# Patient Record
Sex: Female | Born: 1970
Health system: Southern US, Community
[De-identification: ages and names within clinical notes are randomized; demographics above are authoritative.]

## PROBLEM LIST (undated history)

## (undated) DIAGNOSIS — N3010 Interstitial cystitis (chronic) without hematuria: Secondary | ICD-10-CM

## (undated) DIAGNOSIS — F32A Depression, unspecified: Secondary | ICD-10-CM

## (undated) DIAGNOSIS — I10 Essential (primary) hypertension: Secondary | ICD-10-CM

## (undated) DIAGNOSIS — F419 Anxiety disorder, unspecified: Secondary | ICD-10-CM

## (undated) DIAGNOSIS — F329 Major depressive disorder, single episode, unspecified: Secondary | ICD-10-CM

## (undated) DIAGNOSIS — F319 Bipolar disorder, unspecified: Secondary | ICD-10-CM

## (undated) HISTORY — DX: Major depressive disorder, single episode, unspecified: F32.9

## (undated) HISTORY — DX: Interstitial cystitis (chronic) without hematuria: N30.10

## (undated) HISTORY — PX: ABDOMINAL HYSTERECTOMY: SHX81

## (undated) HISTORY — PX: TOTAL ABDOMINAL HYSTERECTOMY W/ BILATERAL SALPINGOOPHORECTOMY: SHX83

## (undated) HISTORY — DX: Anxiety disorder, unspecified: F41.9

## (undated) HISTORY — DX: Bipolar disorder, unspecified: F31.9

## (undated) HISTORY — DX: Depression, unspecified: F32.A

## (undated) HISTORY — PX: CYSTOSCOPY: SUR368

---

## 2004-06-20 ENCOUNTER — Emergency Department (HOSPITAL_COMMUNITY): Admission: EM | Admit: 2004-06-20 | Discharge: 2004-06-20 | Payer: Self-pay | Admitting: Emergency Medicine

## 2004-08-31 LAB — CONVERTED CEMR LAB: Pap Smear: NORMAL

## 2004-12-30 ENCOUNTER — Emergency Department (HOSPITAL_COMMUNITY): Admission: EM | Admit: 2004-12-30 | Discharge: 2004-12-30 | Payer: Self-pay | Admitting: Emergency Medicine

## 2006-10-25 ENCOUNTER — Other Ambulatory Visit: Admission: RE | Admit: 2006-10-25 | Discharge: 2006-10-25 | Payer: Self-pay | Admitting: Family Medicine

## 2007-09-14 ENCOUNTER — Ambulatory Visit: Payer: Self-pay | Admitting: Internal Medicine

## 2007-09-14 DIAGNOSIS — N3010 Interstitial cystitis (chronic) without hematuria: Secondary | ICD-10-CM

## 2007-09-14 DIAGNOSIS — F17200 Nicotine dependence, unspecified, uncomplicated: Secondary | ICD-10-CM

## 2007-09-14 DIAGNOSIS — F1010 Alcohol abuse, uncomplicated: Secondary | ICD-10-CM | POA: Insufficient documentation

## 2007-09-14 DIAGNOSIS — F319 Bipolar disorder, unspecified: Secondary | ICD-10-CM | POA: Insufficient documentation

## 2007-09-14 DIAGNOSIS — L719 Rosacea, unspecified: Secondary | ICD-10-CM

## 2007-09-14 DIAGNOSIS — F411 Generalized anxiety disorder: Secondary | ICD-10-CM

## 2007-09-16 ENCOUNTER — Telehealth (INDEPENDENT_AMBULATORY_CARE_PROVIDER_SITE_OTHER): Payer: Self-pay | Admitting: *Deleted

## 2007-09-19 ENCOUNTER — Encounter: Admission: RE | Admit: 2007-09-19 | Discharge: 2007-09-19 | Payer: Self-pay | Admitting: Internal Medicine

## 2007-09-21 ENCOUNTER — Encounter: Payer: Self-pay | Admitting: Internal Medicine

## 2007-09-21 ENCOUNTER — Telehealth: Payer: Self-pay | Admitting: Internal Medicine

## 2007-10-03 ENCOUNTER — Telehealth: Payer: Self-pay | Admitting: Internal Medicine

## 2007-10-11 ENCOUNTER — Ambulatory Visit: Payer: Self-pay | Admitting: Internal Medicine

## 2007-11-09 ENCOUNTER — Ambulatory Visit: Payer: Self-pay | Admitting: Internal Medicine

## 2007-11-15 ENCOUNTER — Telehealth: Payer: Self-pay | Admitting: Internal Medicine

## 2007-11-25 ENCOUNTER — Telehealth: Payer: Self-pay | Admitting: Internal Medicine

## 2007-12-07 ENCOUNTER — Telehealth: Payer: Self-pay | Admitting: Internal Medicine

## 2008-01-11 ENCOUNTER — Encounter: Payer: Self-pay | Admitting: Internal Medicine

## 2008-01-12 ENCOUNTER — Ambulatory Visit (HOSPITAL_BASED_OUTPATIENT_CLINIC_OR_DEPARTMENT_OTHER): Admission: RE | Admit: 2008-01-12 | Discharge: 2008-01-12 | Payer: Self-pay | Admitting: Urology

## 2008-01-12 ENCOUNTER — Encounter (INDEPENDENT_AMBULATORY_CARE_PROVIDER_SITE_OTHER): Payer: Self-pay | Admitting: Urology

## 2008-02-07 ENCOUNTER — Telehealth: Payer: Self-pay | Admitting: Internal Medicine

## 2008-03-08 ENCOUNTER — Encounter: Payer: Self-pay | Admitting: Internal Medicine

## 2008-03-20 ENCOUNTER — Emergency Department (HOSPITAL_COMMUNITY): Admission: EM | Admit: 2008-03-20 | Discharge: 2008-03-20 | Payer: Self-pay | Admitting: Emergency Medicine

## 2008-04-22 ENCOUNTER — Encounter: Admission: RE | Admit: 2008-04-22 | Discharge: 2008-04-22 | Payer: Self-pay | Admitting: Orthopedic Surgery

## 2008-05-02 ENCOUNTER — Telehealth: Payer: Self-pay | Admitting: Internal Medicine

## 2008-07-16 ENCOUNTER — Ambulatory Visit: Payer: Self-pay | Admitting: Internal Medicine

## 2008-07-23 ENCOUNTER — Ambulatory Visit: Payer: Self-pay | Admitting: Family Medicine

## 2008-08-13 ENCOUNTER — Telehealth: Payer: Self-pay | Admitting: Internal Medicine

## 2008-08-16 ENCOUNTER — Ambulatory Visit: Payer: Self-pay | Admitting: Family Medicine

## 2008-09-11 ENCOUNTER — Ambulatory Visit: Payer: Self-pay | Admitting: Internal Medicine

## 2008-09-11 DIAGNOSIS — G43019 Migraine without aura, intractable, without status migrainosus: Secondary | ICD-10-CM

## 2008-10-12 ENCOUNTER — Telehealth: Payer: Self-pay | Admitting: Internal Medicine

## 2008-10-16 ENCOUNTER — Ambulatory Visit: Payer: Self-pay | Admitting: Internal Medicine

## 2008-10-19 ENCOUNTER — Encounter: Admission: RE | Admit: 2008-10-19 | Discharge: 2008-10-19 | Payer: Self-pay | Admitting: Internal Medicine

## 2008-10-29 ENCOUNTER — Encounter: Payer: Self-pay | Admitting: Internal Medicine

## 2008-12-03 ENCOUNTER — Encounter: Payer: Self-pay | Admitting: Internal Medicine

## 2009-02-21 ENCOUNTER — Telehealth: Payer: Self-pay | Admitting: Internal Medicine

## 2009-02-22 ENCOUNTER — Ambulatory Visit: Payer: Self-pay | Admitting: Internal Medicine

## 2009-02-22 DIAGNOSIS — R822 Biliuria: Secondary | ICD-10-CM | POA: Insufficient documentation

## 2009-02-22 LAB — CONVERTED CEMR LAB
Glucose, Urine, Semiquant: NEGATIVE
Specific Gravity, Urine: 1.01
pH: 5.5

## 2009-02-23 ENCOUNTER — Encounter: Payer: Self-pay | Admitting: Internal Medicine

## 2009-02-25 LAB — CONVERTED CEMR LAB
Albumin: 3.9 g/dL (ref 3.5–5.2)
BUN: 11 mg/dL (ref 6–23)
Bilirubin, Direct: 0.1 mg/dL (ref 0.0–0.3)
Glucose, Bld: 81 mg/dL (ref 70–99)
Total Protein: 6.8 g/dL (ref 6.0–8.3)

## 2009-06-27 ENCOUNTER — Encounter (INDEPENDENT_AMBULATORY_CARE_PROVIDER_SITE_OTHER): Payer: Self-pay | Admitting: *Deleted

## 2009-06-28 ENCOUNTER — Telehealth: Payer: Self-pay | Admitting: Internal Medicine

## 2009-07-04 ENCOUNTER — Ambulatory Visit: Payer: Self-pay | Admitting: Internal Medicine

## 2009-10-01 ENCOUNTER — Emergency Department (HOSPITAL_COMMUNITY): Admission: EM | Admit: 2009-10-01 | Discharge: 2009-10-01 | Payer: Self-pay | Admitting: Emergency Medicine

## 2010-02-13 ENCOUNTER — Ambulatory Visit: Payer: Self-pay | Admitting: Family Medicine

## 2010-02-13 DIAGNOSIS — R1013 Epigastric pain: Secondary | ICD-10-CM

## 2010-09-30 NOTE — Assessment & Plan Note (Signed)
Summary: severe epigastric pain/ cannot wait until tomorrow/dm   Vital Signs:  Patient profile:   40 year old female Weight:      229 pounds Temp:     98.0 degrees F oral BP sitting:   110 / 80  (left arm)  Vitals Entered By: Kathrynn Speed CMA (February 13, 2010 2:49 PM) CC: Epigastric pain   History of Present Illness: Patient seen with abdominal pain for the past couple of days. Questionable history of IBS though not formally diagnosed. Pain mostly mid epigastric region without radiation. Feels bloated. Does also have some more nonspecific cramp-like pains lower abdomen.  Has had some mild nausea but no vomiting. No fever. No GERD symptoms. Took ibuprofen without relief. Has alternating constipation and diarrhea.   Last BM ealier today, but small volume.  No melena or hematochezia. Not aggravated by any particular foods.  No alleviating factors.  Also has history of interstitial cystitis. Smoker. No alcohol use.  Current Medications (verified): 1)  Vitamin-B Complex   Tabs (B Complex Vitamins) .... Once Daily 2)  Caltrate 600+d 600-400 Mg-Unit  Tabs (Calcium Carbonate-Vitamin D) .... Two Times A Day 3)  Lamictal 150 Mg Tabs (Lamotrigine) .... Take 1 Tablet By Mouth Once A Day 4)  Ventolin Hfa 108 (90 Base) Mcg/act Aers (Albuterol Sulfate) .... 2 Puffs Every 4 Hours As Needed Wheezing 5)  Etodolac 500 Mg Tabs (Etodolac) .... Take 1 Tablet By Mouth Two Times A Day As Needed 6)  Verapamil Hcl Cr 240 Mg Cr-Tabs (Verapamil Hcl) .... Take 1 Tablet By Mouth Two Times A Day 7)  Zomig 5 Mg Tabs (Zolmitriptan) .... As Needed  Allergies (verified): 1)  ! * Latex 2)  ! Augmentin (Amoxicillin-Pot Clavulanate) 3)  ! Depakote (Divalproex Sodium)  Past History:  Past Medical History: Last updated: 09/02/08 interstitial cystitis Anxiety Depression  bipolar migraine headaches  Past Surgical History: Last updated: 09/14/2007 Hysterectomy  2003 (endometriosis, cysts that rupture) cystoscopy  for interstitial cystitis Oophorectomy  Family History: Last updated: September 02, 2008 mother deceased suicide Family History of Suicide attempt father unknown health Family Hsitory Headaches  Social History: Last updated: 10/16/2008 Divorced 2 kids healthy Occupation: Current Smoker Alcohol use-yes--has eliminated for the past several weeks  Risk Factors: Alcohol Use: 4+ (09/14/2007) Exercise: no (09/14/2007)  Risk Factors: Smoking Status: current (07/04/2009) Packs/Day: <0.25 (07/04/2009) PMH-FH-SH reviewed for relevance  Review of Systems       The patient complains of abdominal pain.  The patient denies anorexia, fever, weight loss, chest pain, melena, hematochezia, and severe indigestion/heartburn.    Physical Exam  General:  Well-developed,well-nourished,in no acute distress; alert,appropriate and cooperative throughout examination Mouth:  Oral mucosa and oropharynx without lesions or exudates.  Teeth in good repair. Neck:  No deformities, masses, or tenderness noted. Lungs:  Normal respiratory effort, chest expands symmetrically. Lungs are clear to auscultation, no crackles or wheezes. Heart:  Normal rate and regular rhythm. S1 and S2 normal without gallop, murmur, click, rub or other extra sounds. Abdomen:  soft and normal bowel sounds.  mild tenderness mid epigastric region. No guarding or rebound. No hepatomegaly or splenomegaly. No masses palpated. Extremities:  No clubbing, cyanosis, edema, or deformity noted with normal full range of motion of all joints.   Psych:  normally interactive, good eye contact, not anxious appearing, and not depressed appearing.     Impression & Recommendations:  Problem # 1:  ABDOMINAL PAIN, EPIGASTRIC (ICD-789.06) Assessment [place] gallstones.  DDX gastritis, IBS .  Avoid further  NSAIDS.  Samples of Dexilant 60 mg by mouth once daily given.  Short term cautious use of Bentyl (she is cautioned about possible constipation and to stop  immediatley if she has any vomiting or worsening pain on this).  Consider ultrasound and further eval if pain persists.  Discussed role of fiber and exercise in IBS.  Complete Medication List: 1)  Vitamin-b Complex Tabs (B complex vitamins) .... Once daily 2)  Caltrate 600+d 600-400 Mg-unit Tabs (Calcium carbonate-vitamin d) .... Two times a day 3)  Lamictal 150 Mg Tabs (Lamotrigine) .... Take 1 tablet by mouth once a day 4)  Ventolin Hfa 108 (90 Base) Mcg/act Aers (Albuterol sulfate) .... 2 puffs every 4 hours as needed wheezing 5)  Etodolac 500 Mg Tabs (Etodolac) .... Take 1 tablet by mouth two times a day as needed 6)  Verapamil Hcl Cr 240 Mg Cr-tabs (Verapamil hcl) .... Take 1 tablet by mouth two times a day 7)  Zomig 5 Mg Tabs (Zolmitriptan) .... As needed 8)  Bentyl 10 Mg Caps (Dicyclomine hcl) .... One by mouth q 6 hours as needed abdominal pain  Patient Instructions: 1)  avoid further use of ibuprofen, aspirin, or Aleve. 2)  Avoid fatty foods or spicy foods. 3)  Follow up immediately if you have any worsening pain, vomiting, or fever 4)  Take Dexilant 60 mg one by mouth daily. Prescriptions: BENTYL 10 MG CAPS (DICYCLOMINE HCL) one by mouth q 6 hours as needed abdominal pain  #30 x 0   Entered and Authorized by:   Evelena Peat MD   Signed by:   Evelena Peat MD on 02/13/2010   Method used:   Electronically to        [facility] # [phone]* (retail)       [address]       [place], [place]  [address]       Ph: [phone]       Fax: [phone]   RxID:   [id number]

## 2010-11-19 ENCOUNTER — Telehealth: Payer: Self-pay | Admitting: *Deleted

## 2010-11-19 LAB — URINALYSIS, ROUTINE W REFLEX MICROSCOPIC
Hgb urine dipstick: NEGATIVE
Ketones, ur: NEGATIVE mg/dL
Specific Gravity, Urine: <1.005 — ABNORMAL LOW (ref 1.005–1.030)
Urobilinogen, UA: 0.2 mg/dL (ref 0.0–1.0)

## 2010-11-19 LAB — CBC
HCT: 43.3 % (ref 36.0–46.0)
Hemoglobin: 14.9 g/dL (ref 12.0–15.0)
MCHC: 34.4 g/dL (ref 30.0–36.0)
MCV: 93.1 fL (ref 78.0–100.0)

## 2010-11-19 LAB — BASIC METABOLIC PANEL
Calcium: 9.5 mg/dL (ref 8.4–10.5)
Creatinine, Ser: 0.6 mg/dL (ref 0.4–1.2)
Potassium: 3.9 mEq/L (ref 3.5–5.1)

## 2010-11-19 LAB — DIFFERENTIAL
Eosinophils Absolute: 0.1 10*3/uL (ref 0.0–0.7)
Lymphocytes Relative: 22 % (ref 12–46)
Lymphs Abs: 1.6 10*3/uL (ref 0.7–4.0)
Monocytes Relative: 5 % (ref 3–12)

## 2010-11-19 LAB — POCT PREGNANCY, URINE: Preg Test, Ur: NEGATIVE

## 2010-11-20 ENCOUNTER — Telehealth: Payer: Self-pay | Admitting: *Deleted

## 2010-11-20 ENCOUNTER — Ambulatory Visit (INDEPENDENT_AMBULATORY_CARE_PROVIDER_SITE_OTHER)
Admission: RE | Admit: 2010-11-20 | Discharge: 2010-11-20 | Disposition: A | Discharge status: Home / Self Care | Payer: Self-pay | Source: Ambulatory Visit | Attending: Internal Medicine | Admitting: Internal Medicine

## 2010-11-20 DIAGNOSIS — J189 Pneumonia, unspecified organism: Secondary | ICD-10-CM

## 2010-11-20 NOTE — Telephone Encounter (Signed)
tp dr swprds

## 2010-11-20 NOTE — Telephone Encounter (Signed)
Pt. Has had 3 weeks of pneumonia and taking Spectracef 400 mg which makes her feel weak and nauseated with diarrhea.  Per Dr. Lovell Sheehan, stop meds and get chest xray this am for Dr. Cato Mulligan.

## 2010-11-20 NOTE — Telephone Encounter (Signed)
Results given to pt re chest xray.

## 2010-11-20 NOTE — Telephone Encounter (Signed)
Given pt chest xray results

## 2010-11-21 NOTE — Telephone Encounter (Signed)
(  Original Triage Message-walk in request) 11/19/10   Pt states she has had pneumonia for 3 weeks visited urgent care on [address] twice.  They gave her spectracef 400mg  twice a day for 10 days.  Pt has nausea, diarrhea, dizzy and feels like she is going to pass out.  Could this be a reaction to medication?  Pt spoke with triage nurse and per Dr. Lovell Sheehan orders pt should stop antibiotic, hydrate and have chest xray in the am.

## 2011-01-13 NOTE — Op Note (Signed)
[name], [name]              ACCOUNT NO.:  [id number]   MEDICAL RECORD NO.:  [id number]          PATIENT TYPE:  AMB   LOCATION:  [facility]                         FACILITY:  [facility]   PHYSICIAN:  Sigmund I. Patsi Sears, M.D.DATE OF BIRTH:  23-Jan-1971   DATE OF PROCEDURE:  01/12/2008  DATE OF DISCHARGE:                               OPERATIVE REPORT   PREOPERATIVE DIAGNOSIS:  Dysfunctional elimination syndrome,  Interstitial cystitis.   POSTOPERATIVE DIAGNOSIS:  Same.   OPERATION:  Cystourethroscopy, hydrodistention of the bladder (1000 mL),  cold cup bladder biopsy, cauterization of biopsy sites, insertion of  Marcaine and Pyridium in the bladder, injection of Marcaine and Kenalog  in the subtrigonal space.   SURGEON:  Sigmund I. Patsi Sears, M.D.   ANESTHESIA:  General LMA.   PREPARATION:  After appropriate preanesthesia, the patient was brought  to the operating room and placed upon the operating table in the dorsal  supine position where general LMA anesthesia was introduced.  She was  then replaced in the dorsal lithotomy position where the pubis was  prepped with Betadine solution and draped in the usual fashion.   REVIEW OF HISTORY:  This 40 year old female, has a history of IC, with  diagnosis made in [place], [place] in 2003, and followed with  two bladder installations of DMSO.  This was stopped because of pain.  Currently, the patient complains of severe urinary urgency, frequency,  nocturia x4.  She had been well controlled in the past with dietary  limitations of spicy foods, but continues to smoke.  The patient drinks  two alcoholic drinks per night.  She has a history of asthma, severe  dyspareunia, IBS and migraines.  She is now for cystoscopic examination.   DESCRIPTION OF PROCEDURE:  The urethra was normal, and there was no  evidence of stricture disease.  Cystourethroscopy was accomplished, and  shows that the patient has normal-appearing bladder base,  with clear  efflux from both orifices.  The ureteral orifices are well positioned on  a normal trigone.  The bladder holds 1000 mL.  Cystoscopy again reveals  no definite ulcerations, and cold cup bladder biopsies were obtained,  and the biopsy sites were cauterized.  The patient tolerated the  procedure well.  Marcaine and Pyridium was inserted in the bladder, and  Marcaine and Kenalog was injected into the bladder base.  The patient is  ALLERGIC TO LATEX, and was treated completely with latex free  instrumentation.  She received a B&O suppository at the beginning of  procedure, and IV Toradol at the end of the procedure.  She was awakened  and taken to the recovery room in good condition.      Sigmund I. Patsi Sears, M.D.  Electronically Signed     SIT/MEDQ  D:  01/12/2008  T:  01/12/2008  Job:  [id number]   cc:   Gerri Spore B. Earlene Plater, M.D.  Fax: [phone]

## 2011-11-05 ENCOUNTER — Telehealth: Payer: Self-pay | Admitting: *Deleted

## 2011-11-05 ENCOUNTER — Encounter: Payer: Self-pay | Admitting: Family

## 2011-11-05 ENCOUNTER — Ambulatory Visit (INDEPENDENT_AMBULATORY_CARE_PROVIDER_SITE_OTHER): Payer: 59 | Admitting: Family

## 2011-11-05 DIAGNOSIS — J209 Acute bronchitis, unspecified: Secondary | ICD-10-CM

## 2011-11-05 DIAGNOSIS — R05 Cough: Secondary | ICD-10-CM

## 2011-11-05 DIAGNOSIS — R059 Cough, unspecified: Secondary | ICD-10-CM

## 2011-11-05 MED ORDER — DOXYCYCLINE HYCLATE 100 MG PO TABS: 100.0000 mg | ORAL_TABLET | Freq: Two times a day (BID) | ORAL | Status: DC

## 2011-11-05 MED ORDER — HYDROCOD POLST-CHLORPHEN POLST 10-8 MG/5ML PO LQCR: 5.0000 mL | Freq: Two times a day (BID) | ORAL | Status: DC | PRN

## 2011-11-05 NOTE — Progress Notes (Signed)
  Subjective:    Patient ID: Catherine [name], female    DOB: Dec 05, 1970, 41 y.o.   MRN: [id number]  HPI 41 year old white female, pack-a-day smoker, patient of Dr. Reymundo Poll in today with complaints of sinus pressure, cough that is productive with yellow phlegm. Over the last couple days she's become more short of breath and feeling more weak and fatigued. She's been taking Mucinex without relief. She denies any lightheadedness, dizziness, chest pain, palpitations, or edema.   Review of Systems  Constitutional: Positive for fever, chills and fatigue.  HENT: Positive for congestion, sore throat, sneezing and postnasal drip.   Respiratory: Positive for cough.   Cardiovascular: Negative.   Musculoskeletal: Negative.   Skin: Negative.   Neurological: Negative.   Hematological: Negative.   Psychiatric/Behavioral: Negative.    Past Medical History  Diagnosis Date  . Anxiety   . Depression   . Migraine   . Cystitis, interstitial     History   Social History  . Marital Status: Divorced    Spouse Name: N/A    Number of Children: N/A  . Years of Education: N/A   Occupational History  . Not on file.   Social History Main Topics  . Smoking status: Current Everyday Smoker  . Smokeless tobacco: Not on file  . Alcohol Use: Yes  . Drug Use: Not on file  . Sexually Active: Not on file   Other Topics Concern  . Not on file   Social History Narrative  . No narrative on file    Past Surgical History  Procedure Date  . Abdominal hysterectomy   . Total abdominal hysterectomy w/ bilateral salpingoophorectomy   . Cystoscopy     Family History  Problem Relation Age of Onset  . Sudden death Mother     suicide    Allergies  Allergen Reactions  . Divalproex Sodium     REACTION: nausea  . WUJ:[id number]+AOZHYQMVH+QIONGEXBMW Acid+Aspartame     REACTION: SEVERE STOMACH CRAMPING  . Latex     REACTION: contact dermatitis  . Penicillins     No current outpatient  prescriptions on file prior to visit.    BP 110/84  Temp(Src) 98.7 F (37.1 C) (Oral)  Ht 5\' 10"  (1.778 m)  Wt 227 lb (102.967 kg)  BMI 32.57 kg/m2chart    Objective:   Physical Exam  Constitutional: She is oriented to person, place, and time. She appears well-developed and well-nourished.  HENT:  Right Ear: External ear normal.  Left Ear: External ear normal.  Nose: Nose normal.  Mouth/Throat: Oropharynx is clear and moist.  Neck: Normal range of motion. Neck supple.  Cardiovascular: Normal rate, regular rhythm and normal heart sounds.   Pulmonary/Chest: Effort normal and breath sounds normal.  Musculoskeletal: Normal range of motion.  Neurological: She is alert and oriented to person, place, and time.  Skin: Skin is warm and dry.  Psychiatric: She has a normal mood and affect.          Assessment & Plan:   Assessment: Bronchitis, cough  Plan: Doxycycline 100 mg twice a day x10 days. Tussionex 1 teaspoon twice daily when necessary cough. Warned of drowsiness. Respiratory drink plenty of fluids. Call the office if symptoms worsen or persist come recheck as scheduled and when necessary.

## 2011-11-05 NOTE — Patient Instructions (Signed)

## 2011-11-05 NOTE — Telephone Encounter (Signed)
Pt scheduled with Padonda today .

## 2011-11-05 NOTE — Telephone Encounter (Signed)
(  triage voicemail) Pt states she's had a severe cold x 2 weeks.  She was diagnosed with pneumonia last year and the symptoms she currently has seem similar, cough and pain in back.  Currently taking an OTC med but needs to know if she should be prescribed an antibiotic or if she needs to come in to be seen.

## 2011-11-05 NOTE — Telephone Encounter (Signed)
Scheduled with Padonda  

## 2011-11-09 ENCOUNTER — Telehealth: Payer: Self-pay | Admitting: Family Medicine

## 2011-11-09 ENCOUNTER — Emergency Department (HOSPITAL_COMMUNITY): Payer: 59

## 2011-11-09 ENCOUNTER — Emergency Department (HOSPITAL_COMMUNITY)
Admission: EM | Admit: 2011-11-09 | Discharge: 2011-11-09 | Disposition: A | Discharge status: Home / Self Care | Payer: 59 | Attending: Emergency Medicine | Admitting: Emergency Medicine

## 2011-11-09 ENCOUNTER — Other Ambulatory Visit: Payer: Self-pay

## 2011-11-09 DIAGNOSIS — R0602 Shortness of breath: Secondary | ICD-10-CM | POA: Insufficient documentation

## 2011-11-09 DIAGNOSIS — F411 Generalized anxiety disorder: Secondary | ICD-10-CM | POA: Insufficient documentation

## 2011-11-09 DIAGNOSIS — J40 Bronchitis, not specified as acute or chronic: Secondary | ICD-10-CM | POA: Insufficient documentation

## 2011-11-09 DIAGNOSIS — R0609 Other forms of dyspnea: Secondary | ICD-10-CM | POA: Insufficient documentation

## 2011-11-09 DIAGNOSIS — R0789 Other chest pain: Secondary | ICD-10-CM | POA: Insufficient documentation

## 2011-11-09 DIAGNOSIS — R0989 Other specified symptoms and signs involving the circulatory and respiratory systems: Secondary | ICD-10-CM | POA: Insufficient documentation

## 2011-11-09 DIAGNOSIS — F419 Anxiety disorder, unspecified: Secondary | ICD-10-CM

## 2011-11-09 DIAGNOSIS — R0682 Tachypnea, not elsewhere classified: Secondary | ICD-10-CM | POA: Insufficient documentation

## 2011-11-09 LAB — BASIC METABOLIC PANEL
BUN: 10 mg/dL (ref 6–23)
BUN: 9 mg/dL (ref 6–23)
Calcium: 10.5 mg/dL (ref 8.4–10.5)
Creatinine, Ser: 0.57 mg/dL (ref 0.50–1.10)
GFR calc Af Amer: >90 mL/min (ref >90)
GFR calc Af Amer: >90 mL/min (ref >90)
GFR calc non Af Amer: >90 mL/min (ref >90)
GFR calc non Af Amer: >90 mL/min (ref >90)
Glucose, Bld: 97 mg/dL (ref 70–99)
Potassium: 2.7 mEq/L — CL (ref 3.5–5.1)
Sodium: 136 mEq/L (ref 135–145)

## 2011-11-09 LAB — CBC
Hemoglobin: 15.6 g/dL — ABNORMAL HIGH (ref 12.0–15.0)
MCH: 31.8 pg (ref 26.0–34.0)
MCHC: 36.4 g/dL — ABNORMAL HIGH (ref 30.0–36.0)
RDW: 13.8 % (ref 11.5–15.5)

## 2011-11-09 LAB — D-DIMER, QUANTITATIVE: D-Dimer, Quant: 0.24 ug/mL-FEU (ref 0.00–0.48)

## 2011-11-09 MED ORDER — PREDNISONE 50 MG PO TABS: ORAL_TABLET | ORAL | Status: AC

## 2011-11-09 MED ORDER — MAGNESIUM SULFATE 40 MG/ML IJ SOLN
2.0000 g | INTRAMUSCULAR | Status: AC
  Administered 2011-11-09: 2 g via INTRAVENOUS
  Filled 2011-11-09: qty 50

## 2011-11-09 MED ORDER — ALBUTEROL SULFATE (5 MG/ML) 0.5% IN NEBU
2.5000 mg | INHALATION_SOLUTION | Freq: Once | RESPIRATORY_TRACT | Status: AC
  Administered 2011-11-09: 2.5 mg via RESPIRATORY_TRACT

## 2011-11-09 MED ORDER — LORAZEPAM 2 MG/ML IJ SOLN
0.5000 mg | Freq: Once | INTRAMUSCULAR | Status: AC
  Administered 2011-11-09: 0.5 mg via INTRAVENOUS
  Filled 2011-11-09: qty 1

## 2011-11-09 MED ORDER — METHYLPREDNISOLONE SODIUM SUCC 125 MG IJ SOLR
125.0000 mg | Freq: Once | INTRAMUSCULAR | Status: AC
  Administered 2011-11-09: 125 mg via INTRAVENOUS
  Filled 2011-11-09: qty 2

## 2011-11-09 MED ORDER — ALBUTEROL SULFATE HFA 108 (90 BASE) MCG/ACT IN AERS: 2.0000 | INHALATION_SPRAY | RESPIRATORY_TRACT | Status: AC | PRN

## 2011-11-09 MED ORDER — ALBUTEROL SULFATE (5 MG/ML) 0.5% IN NEBU
5.0000 mg | INHALATION_SOLUTION | Freq: Once | RESPIRATORY_TRACT | Status: AC
  Administered 2011-11-09: 5 mg via RESPIRATORY_TRACT

## 2011-11-09 MED ORDER — ALBUTEROL SULFATE (5 MG/ML) 0.5% IN NEBU
2.5000 mg | INHALATION_SOLUTION | Freq: Once | RESPIRATORY_TRACT | Status: AC
  Administered 2011-11-09: 2.5 mg via RESPIRATORY_TRACT
  Filled 2011-11-09: qty 0.5

## 2011-11-09 MED ORDER — LORAZEPAM 2 MG/ML IJ SOLN
0.5000 mg | Freq: Once | INTRAMUSCULAR | Status: AC
  Administered 2011-11-09: 12:00:00 via INTRAVENOUS
  Filled 2011-11-09: qty 1

## 2011-11-09 MED ORDER — LORAZEPAM 1 MG PO TABS: 1.0000 mg | ORAL_TABLET | Freq: Once | ORAL | Status: DC

## 2011-11-09 MED ORDER — IPRATROPIUM BROMIDE 0.02 % IN SOLN
0.5000 mg | Freq: Once | RESPIRATORY_TRACT | Status: AC
  Administered 2011-11-09: 0.5 mg via RESPIRATORY_TRACT

## 2011-11-09 MED ORDER — POTASSIUM CHLORIDE CRYS ER 20 MEQ PO TBCR
40.0000 meq | EXTENDED_RELEASE_TABLET | Freq: Once | ORAL | Status: AC
  Administered 2011-11-09: 40 meq via ORAL
  Filled 2011-11-09: qty 2

## 2011-11-09 MED ORDER — SODIUM CHLORIDE 0.9 % IV BOLUS (SEPSIS)
1000.0000 mL | Freq: Once | INTRAVENOUS | Status: AC
  Administered 2011-11-09: 1000 mL via INTRAVENOUS

## 2011-11-09 NOTE — Telephone Encounter (Signed)
Saw Catherine [name] last Thursday and was put on antibiotic. At that time, Catherine [name] told her to call back today if not better. She is not better, is more winded and having harder time breathing. Stated [name] said she might want to do chest xray. Please call pt ASAP to advise. Thanks!

## 2011-11-09 NOTE — Discharge Instructions (Signed)
Bronchitis Take the steroids as prescribed, finish your doxycycline prescription. Followup with your doctor this week. Return to ED for new or worsening symptoms Bronchitis is the body's way of reacting to injury and/or infection (inflammation) of the bronchi. Bronchi are the air tubes that extend from the windpipe into the lungs. If the inflammation becomes severe, it may cause shortness of breath. CAUSES  Inflammation may be caused by:  A virus.   Germs (bacteria).   Dust.   Allergens.   Pollutants and many other irritants.  The cells lining the bronchial tree are covered with tiny hairs (cilia). These constantly beat upward, away from the lungs, toward the mouth. This keeps the lungs free of pollutants. When these cells become too irritated and are unable to do their job, mucus begins to develop. This causes the characteristic cough of bronchitis. The cough clears the lungs when the cilia are unable to do their job. Without either of these protective mechanisms, the mucus would settle in the lungs. Then you would develop pneumonia. Smoking is a common cause of bronchitis and can contribute to pneumonia. Stopping this habit is the single most important thing you can do to help yourself. TREATMENT   Your caregiver may prescribe an antibiotic if the cough is caused by bacteria. Also, medicines that open up your airways make it easier to breathe. Your caregiver may also recommend or prescribe an expectorant. It will loosen the mucus to be coughed up. Only take over-the-counter or prescription medicines for pain, discomfort, or fever as directed by your caregiver.   Removing whatever causes the problem (smoking, for example) is critical to preventing the problem from getting worse.   Cough suppressants may be prescribed for relief of cough symptoms.   Inhaled medicines may be prescribed to help with symptoms now and to help prevent problems from returning.   For those with recurrent  (chronic) bronchitis, there may be a need for steroid medicines.  SEEK IMMEDIATE MEDICAL CARE IF:   During treatment, you develop more pus-like mucus (purulent sputum).   You have a fever.   Your baby is older than 3 months with a rectal temperature of 102 F (38.9 C) or higher.   Your baby is 67 months old or younger with a rectal temperature of 100.4 F (38 C) or higher.   You become progressively more ill.   You have increased difficulty breathing, wheezing, or shortness of breath.  It is necessary to seek immediate medical care if you are elderly or sick from any other disease. MAKE SURE YOU:   Understand these instructions.   Will watch your condition.   Will get help right away if you are not doing well or get worse.  Document Released: 08/17/2005 Document Revised: 08/06/2011 Document Reviewed: 06/26/2008 [facility] Patient Information 2012 [place], [place].Asthma, Adult Asthma is caused by narrowing of the air passages in the lungs. It may be triggered by pollen, dust, animal dander, molds, some foods, respiratory infections, exposure to smoke, exercise, emotional stress or other allergens (things that cause allergic reactions or allergies). Repeat attacks are common. HOME CARE INSTRUCTIONS   Use prescription medications as ordered by your caregiver.   Avoid pollen, dust, animal dander, molds, smoke and other things that cause attacks at home and at work.   You may have fewer attacks if you decrease dust in your home. Electrostatic air cleaners may help.   It may help to replace your pillows or mattress with materials less likely to cause allergies.  Talk to your caregiver about an action plan for managing asthma attacks at home, including, the use of a peak flow meter which measures the severity of your asthma attack. An action plan can help minimize or stop the attack without having to seek medical care.   If you are not on a fluid restriction, drink 8 to 10 glasses of  water each day.   Always have a plan prepared for seeking medical attention, including, calling your physician, accessing local emergency care, and calling 911 (in the [place] for a severe attack.   Discuss possible exercise routines with your caregiver.   If animal dander is the cause of asthma, you may need to get rid of pets.  SEEK MEDICAL CARE IF:   You have wheezing and shortness of breath even if taking medicine to prevent attacks.   You have muscle aches, chest pain or thickening of sputum.   Your sputum changes from clear or white to yellow, green, gray, or bloody.   You have any problems that may be related to the medicine you are taking (such as a rash, itching, swelling or trouble breathing).  SEEK IMMEDIATE MEDICAL CARE IF:   Your usual medicines do not stop your wheezing or there is increased coughing and/or shortness of breath.   You have increased difficulty breathing.   You have a fever.  MAKE SURE YOU:   Understand these instructions.   Will watch your condition.   Will get help right away if you are not doing well or get worse.  Document Released: 08/17/2005 Document Revised: 08/06/2011 Document Reviewed: 04/04/2008 [facility] Patient Information 2012 [place], [place].

## 2011-11-09 NOTE — Telephone Encounter (Signed)
Phone note given to [facility], and copy sent to [facility].

## 2011-11-09 NOTE — Telephone Encounter (Signed)
Pt called back extremely SOB and wheezing.  Has a friend with her, and was advised to call 911 and go to the ER ASAP.  Breathing has been getting worse all weeke

## 2011-11-09 NOTE — Telephone Encounter (Signed)
Ok, thank you

## 2011-11-09 NOTE — ED Provider Notes (Signed)
History     CSN: [id number]  Arrival date & time 11/09/11  1044   First MD Initiated Contact with Patient 11/09/11 1049      Chief Complaint  Patient presents with  . Asthma  . Shortness of Breath    (Consider location/radiation/quality/duration/timing/severity/associated sxs/prior treatment) HPI Comments: This is a tobacco smoker who presents with shortness of breath and respiratory distress that onset this morning. She states she's been sick for about the past 3 weeks with "bronchitis" and was started on doxycycline by her PCP on March 7. Her breathing got acutely worse this morning and she arrives quite tachypneic and short of breath. There is no fever or chest pain. There is a nonproductive cough. There is no abdominal pain nausea or vomiting. She's no hospitalizations for asthma. Is not available smoked for the past 2 weeks because of her breathing.  The history is provided by the patient.    Past Medical History  Diagnosis Date  . Anxiety   . Depression   . Migraine   . Cystitis, interstitial     Past Surgical History  Procedure Date  . Abdominal hysterectomy   . Total abdominal hysterectomy w/ bilateral salpingoophorectomy   . Cystoscopy     Family History  Problem Relation Age of Onset  . Sudden death Mother     suicide    History  Substance Use Topics  . Smoking status: Current Everyday Smoker  . Smokeless tobacco: Not on file  . Alcohol Use: Yes    OB History    Grav Para Term Preterm Abortions TAB SAB Ect Mult Living                  Review of Systems  Constitutional: Negative for fever.  HENT: Positive for congestion and rhinorrhea. Negative for sore throat.   Respiratory: Positive for cough, chest tightness and shortness of breath.   Cardiovascular: Negative for chest pain.  Gastrointestinal: Negative for nausea, vomiting and abdominal pain.  Genitourinary: Negative for dysuria and hematuria.  Musculoskeletal: Negative for back pain.     Allergies  Divalproex sodium; WUJ:WJXBJYNWGNF+AOZHYQMVH+QIONGEXBMW acid+aspartame; Latex; and Penicillins  Home Medications   Current Outpatient Rx  Name Route Sig Dispense Refill  . CITALOPRAM HYDROBROMIDE 10 MG PO TABS Oral Take 10 mg by mouth daily.    Marland Kitchen DM-GUAIFENESIN ER 30-600 MG PO TB12 Oral Take 1 tablet by mouth every 12 (twelve) hours.    Marland Kitchen DOXYCYCLINE HYCLATE 100 MG PO TABS Oral Take 100 mg by mouth 2 (two) times daily. Pt to take for 10 days. Started on 11-05-11    . ETODOLAC 500 MG PO TABS Oral Take 500 mg by mouth daily as needed. pain    . GABAPENTIN 400 MG PO CAPS Oral Take 400 mg by mouth 3 (three) times daily.    Marland Kitchen LAMOTRIGINE 200 MG PO TABS Oral Take 200 mg by mouth daily.    Marland Kitchen LISDEXAMFETAMINE DIMESYLATE 30 MG PO CAPS Oral Take 30 mg by mouth every morning.    Marland Kitchen ZOLMITRIPTAN 5 MG PO TBDP Oral Take 5 mg by mouth as needed.    . ALBUTEROL SULFATE HFA 108 (90 BASE) MCG/ACT IN AERS Inhalation Inhale 2 puffs into the lungs every 4 (four) hours as needed for wheezing. 1 Inhaler 0  . HYDROCOD POLST-CPM POLST ER 10-8 MG/5ML PO LQCR Oral Take 5 mLs by mouth every 12 (twelve) hours as needed. 140 mL 0  . PREDNISONE 50 MG PO TABS  1 tablet daily  PO 5 tablet 0    BP 123/88  Pulse 119  Resp 22  SpO2 100%  Physical Exam  Constitutional: She is oriented to person, place, and time. She appears distressed.       Moderate respiratory distress, tachypnea and anxious  HENT:  Head: Normocephalic and atraumatic.  Mouth/Throat: Oropharynx is clear and moist. No oropharyngeal exudate.  Eyes: Conjunctivae are normal. Pupils are equal, round, and reactive to light.  Neck: Normal range of motion.  Cardiovascular: Regular rhythm and normal heart sounds.   No murmur heard. Pulmonary/Chest: She is in respiratory distress. She has wheezes.       Course breath sounds bilaterally with scattered wheezing  Abdominal: Soft. There is no tenderness. There is no rebound and no guarding.   Musculoskeletal: Normal range of motion. She exhibits no edema and no tenderness.  Neurological: She is alert and oriented to person, place, and time. No cranial nerve deficit.  Skin: Skin is warm.    ED Course  Procedures (including critical care time)  Labs Reviewed  CBC - Abnormal; Notable for the following:    Hemoglobin 15.6 (*)    MCHC 36.4 (*)    All other components within normal limits  BASIC METABOLIC PANEL - Abnormal; Notable for the following:    Potassium 6.0 (*) HEMOLYZED SPECIMEN - SUGGEST RECOLLECT   CO2 16 (*)    All other components within normal limits  BASIC METABOLIC PANEL - Abnormal; Notable for the following:    Potassium 2.7 (*)    Glucose, Bld 118 (*)    All other components within normal limits  D-DIMER, QUANTITATIVE   Dg Chest Portable 1 View  11/09/2011  *RADIOLOGY REPORT*  Clinical Data: Asthma and shortness of breath.  PORTABLE CHEST - 1 VIEW  Comparison: 11/20/2010  Findings: 1113 hours. The lungs are clear without focal infiltrate, edema, pneumothorax or pleural effusion. Interstitial markings are diffusely coarsened with chronic features. Cardiopericardial silhouette is at upper limits of normal for size. Telemetry leads overlie the chest. Imaged bony structures of the thorax are intact.  IMPRESSION: Chronic interstitial coarsening without focal airspace consolidation.  Original Report Authenticated By: ERIC A. MANSELL, M.D.     1. Bronchitis   2. Anxiety       MDM  Respiratory distress with history of asthma. When the patient does have a component of reactive airway disease and probably bronchitis. She also seems to have a high level of anxiety. She is not hypoxic. Her lung sounds do not correlate with her level of dyspnea.  We'll give nebs, steroids, Ativan.  Work of breathing improved after administration of nebs and steroids Ativan.  Initial alkalosis and hypokalemia from her hyperventilation. This has resolved on recheck.  Ddimer  negative.    Date: 11/09/2011  Rate: 105 hythm: sinus tachycardia  QRS Axis: normal  Intervals: normal  ST/T Wave abnormalities: nonspecific ST/T changes  Conduction Disutrbances:none  Narrative Interpretation:   Old EKG Reviewed: none available         Glynn Octave, MD 11/09/11 1918

## 2011-11-11 ENCOUNTER — Encounter: Payer: Self-pay | Admitting: Family

## 2011-11-11 ENCOUNTER — Ambulatory Visit (INDEPENDENT_AMBULATORY_CARE_PROVIDER_SITE_OTHER): Payer: 59 | Admitting: Family

## 2011-11-11 ENCOUNTER — Ambulatory Visit: Payer: 59 | Admitting: Family

## 2011-11-11 VITALS — BP 120/86 | HR 61 | Temp 98.2°F | Wt 222.0 lb

## 2011-11-11 DIAGNOSIS — F419 Anxiety disorder, unspecified: Secondary | ICD-10-CM

## 2011-11-11 DIAGNOSIS — F430 Acute stress reaction: Secondary | ICD-10-CM

## 2011-11-11 DIAGNOSIS — F411 Generalized anxiety disorder: Secondary | ICD-10-CM

## 2011-11-11 DIAGNOSIS — J209 Acute bronchitis, unspecified: Secondary | ICD-10-CM

## 2011-11-11 DIAGNOSIS — F438 Other reactions to severe stress: Secondary | ICD-10-CM

## 2011-11-11 NOTE — Progress Notes (Signed)
Subjective:    Patient ID: Catherine [name], female    DOB: [birth year], 41 y.o.   MRN: [id number]  HPI 41 year old white female, half a pack per day smoker, patient of Dr. Cato Mulligan is in today as a ED followup. She was seen by me last week for acute bronchitis and started on doxycycline and. 2 days later, she became short of breath and went to the emergency department. She was diagnosed with anxiety and bronchitis at that time. Patient reports doing much better today. She continues her doxycycline and prednisone. She still feels anxious. Her reports more stress at work. An ostomy for some hopelessness hopelessness. She sees Carolynn Sayers, who addresses her medication concerns with regards to her anxiety.   Review of Systems  Constitutional: Negative.   HENT: Negative.   Eyes: Negative.  Negative for itching.  Respiratory: Negative.   Cardiovascular: Negative.   Gastrointestinal: Negative.   Genitourinary: Negative.   Musculoskeletal: Negative.   Skin: Negative.   Neurological: Negative.   Hematological: Negative.   Psychiatric/Behavioral: Positive for agitation. The patient is nervous/anxious.     Past Medical History  Diagnosis Date  . Anxiety   . Depression   . Migraine   . Cystitis, interstitial     History   Social History  . Marital Status: Divorced    Spouse Name: N/A    Number of Children: N/A  . Years of Education: N/A   Occupational History  . Not on file.   Social History Main Topics  . Smoking status: Current Everyday Smoker  . Smokeless tobacco: Not on file  . Alcohol Use: Yes  . Drug Use: Not on file  . Sexually Active: Not on file   Other Topics Concern  . Not on file   Social History Narrative  . No narrative on file    Past Surgical History  Procedure Date  . Abdominal hysterectomy   . Total abdominal hysterectomy w/ bilateral salpingoophorectomy   . Cystoscopy     Family History  Problem Relation Age of Onset  . Sudden death Mother       suicide    Allergies  Allergen Reactions  . Divalproex Sodium     REACTION: nausea  . WUJ:WJXBJYNWGNF+AOZHYQMVH+QIONGEXBMW Acid+Aspartame     REACTION: SEVERE STOMACH CRAMPING  . Latex     REACTION: contact dermatitis  . Penicillins     Current Outpatient Prescriptions on File Prior to Visit  Medication Sig Dispense Refill  . albuterol (PROVENTIL HFA;VENTOLIN HFA) 108 (90 BASE) MCG/ACT inhaler Inhale 2 puffs into the lungs every 4 (four) hours as needed for wheezing.  1 Inhaler  0  . chlorpheniramine-HYDROcodone (TUSSIONEX PENNKINETIC ER) 10-8 MG/5ML LQCR Take 5 mLs by mouth every 12 (twelve) hours as needed.  140 mL  0  . citalopram (CELEXA) 10 MG tablet Take 10 mg by mouth daily.      Marland Kitchen dextromethorphan-guaiFENesin (MUCINEX DM) 30-600 MG per 12 hr tablet Take 1 tablet by mouth every 12 (twelve) hours.      Marland Kitchen doxycycline (VIBRA-TABS) 100 MG tablet Take 100 mg by mouth 2 (two) times daily. Pt to take for 10 days. Started on 11-05-11      . etodolac (LODINE) 500 MG tablet Take 500 mg by mouth daily as needed. pain      . gabapentin (NEURONTIN) 400 MG capsule Take 400 mg by mouth 3 (three) times daily.      Marland Kitchen lamoTRIgine (LAMICTAL) 200 MG tablet Take 200 mg by mouth daily.      Marland Kitchen  lisdexamfetamine (VYVANSE) 30 MG capsule Take 30 mg by mouth every morning.      . predniSONE (DELTASONE) 50 MG tablet 1 tablet daily PO  5 tablet  0  . zolmitriptan (ZOMIG-ZMT) 5 MG disintegrating tablet Take 5 mg by mouth as needed.        BP 120/86  Pulse 61  Temp(Src) 98.2 F (36.8 C) (Oral)  Wt 222 lb (100.699 kg)  SpO2 99%chart    Objective:   Physical Exam  Constitutional: She is oriented to person, place, and time. She appears well-developed and well-nourished.  HENT:  Right Ear: External ear normal.  Left Ear: External ear normal.  Nose: Nose normal.  Mouth/Throat: Oropharynx is clear and moist.  Neck: Normal range of motion. Neck supple.  Cardiovascular: Normal rate, regular rhythm and  normal heart sounds.   Pulmonary/Chest: Effort normal and breath sounds normal.  Musculoskeletal: Normal range of motion.  Neurological: She is alert and oriented to person, place, and time.  Skin: Skin is warm and dry.  Psychiatric: She has a normal mood and affect.          Assessment & Plan:  Assessment: Bronchitis, cough, Anxiety  Plan: I have phoned Carolynn Sayers to speak with him about adjusting her Celexa from 10 mg to 20 mg. She has an appointment with him next week. Her lungs are significantly improved and I do not believe that the shortness of breath she continues to have today, although improved, has anything to do with bronchitis. Patient is probably off his symptoms worsen or persist, return to schedule her when necessary.

## 2012-03-01 ENCOUNTER — Encounter: Payer: Self-pay | Admitting: Internal Medicine

## 2012-03-01 ENCOUNTER — Ambulatory Visit (INDEPENDENT_AMBULATORY_CARE_PROVIDER_SITE_OTHER): Payer: 59 | Admitting: Internal Medicine

## 2012-03-01 ENCOUNTER — Telehealth: Payer: Self-pay | Admitting: Internal Medicine

## 2012-03-01 VITALS — BP 130/92 | Temp 98.2°F | Wt 224.0 lb

## 2012-03-01 DIAGNOSIS — M7989 Other specified soft tissue disorders: Secondary | ICD-10-CM

## 2012-03-01 DIAGNOSIS — Z0000 Encounter for general adult medical examination without abnormal findings: Secondary | ICD-10-CM

## 2012-03-01 NOTE — Assessment & Plan Note (Addendum)
40 year old white female with mild left axillary swelling and tenderness of left upper arm. Question left upper remedy DVT. Obtain venous Doppler.  Left breast exam normal. However patient advised to proceed with screening mammogram.  She is overdue.  Follow up with PCP within 1 week.

## 2012-03-01 NOTE — Telephone Encounter (Signed)
Caller: Kim/Patient; PCP: Birdie Sons; CB#: [phone]; ; ; Call regarding L. Arm Pain At Armpit; onset 1 mth.  Pt has appt already for 7-3.  L Armpit is swollen, double in size, w/ shooting pain down L Arm and Hand.   Arm non-injury Protocol used.  Appt cancelled on 7-3 and rescheduled w/n 4 hrs on 7-2 d/t swelling twice the size as R Arm.  Pt verbalized understanding.

## 2012-03-01 NOTE — Patient Instructions (Addendum)
Our office will contact you re: ultrasound results 

## 2012-03-01 NOTE — Progress Notes (Signed)
  Subjective:    Patient ID: Catherine [name], female    DOB: [birth year], 41 y.o.   MRN: [id number]  HPI  41 year old white female complains of progressive swelling and pain in her left armpit. She first noticed symptoms approximately one month ago. She had her arms behind her head she felt a pressure-like sensation. Swelling seemed to go away but now symptoms are constant. She also describes a dull pain that radiates to left upper arm. She denies any redness or fever.  Last mammogram 2 yrs ago.  She denies breast symptoms.  Review of Systems No fever or chills  Past Medical History  Diagnosis Date  . Anxiety   . Depression   . Migraine   . Cystitis, interstitial     History   Social History  . Marital Status: Divorced    Spouse Name: N/A    Number of Children: N/A  . Years of Education: N/A   Occupational History  . Not on file.   Social History Main Topics  . Smoking status: Current Everyday Smoker  . Smokeless tobacco: Not on file  . Alcohol Use: Yes  . Drug Use: Not on file  . Sexually Active: Not on file   Other Topics Concern  . Not on file   Social History Narrative  . No narrative on file    Past Surgical History  Procedure Date  . Abdominal hysterectomy   . Total abdominal hysterectomy w/ bilateral salpingoophorectomy   . Cystoscopy     Family History  Problem Relation Age of Onset  . Sudden death Mother     suicide    Allergies  Allergen Reactions  . Amoxicillin-Pot Clavulanate     REACTION: SEVERE STOMACH CRAMPING  . Divalproex Sodium     REACTION: nausea  . Latex     REACTION: contact dermatitis  . Penicillins     Current Outpatient Prescriptions on File Prior to Visit  Medication Sig Dispense Refill  . albuterol (PROVENTIL HFA;VENTOLIN HFA) 108 (90 BASE) MCG/ACT inhaler Inhale 2 puffs into the lungs every 4 (four) hours as needed for wheezing.  1 Inhaler  0  . citalopram (CELEXA) 10 MG tablet Take 10 mg by mouth daily.      Marland Kitchen  gabapentin (NEURONTIN) 400 MG capsule Take 400 mg by mouth 3 (three) times daily.      Marland Kitchen lamoTRIgine (LAMICTAL) 200 MG tablet Take 200 mg by mouth daily.      Marland Kitchen lisdexamfetamine (VYVANSE) 30 MG capsule Take 30 mg by mouth every morning.      . zolmitriptan (ZOMIG-ZMT) 5 MG disintegrating tablet Take 5 mg by mouth as needed.        BP 130/92  Temp 98.2 F (36.8 C) (Oral)  Wt 224 lb (101.606 kg)       Objective:   Physical Exam  Constitutional: She appears well-developed and well-nourished.  Cardiovascular: Normal rate, regular rhythm and normal heart sounds.   Pulmonary/Chest: Effort normal and breath sounds normal. She has no wheezes.       Normal left breat exam  Musculoskeletal:       Mild fullness of left axilla, no palpable mass. Mild tenderness of left inner upper arm          Assessment & Plan:

## 2012-03-01 NOTE — Telephone Encounter (Signed)
appt is with Dr Artist Pais

## 2012-03-02 ENCOUNTER — Other Ambulatory Visit: Payer: Self-pay | Admitting: Cardiology

## 2012-03-02 ENCOUNTER — Encounter (INDEPENDENT_AMBULATORY_CARE_PROVIDER_SITE_OTHER): Payer: 59

## 2012-03-02 ENCOUNTER — Ambulatory Visit: Payer: 59 | Admitting: Family

## 2012-03-02 DIAGNOSIS — M79609 Pain in unspecified limb: Secondary | ICD-10-CM

## 2012-03-02 DIAGNOSIS — R609 Edema, unspecified: Secondary | ICD-10-CM

## 2012-03-02 DIAGNOSIS — M7989 Other specified soft tissue disorders: Secondary | ICD-10-CM

## 2012-03-02 DIAGNOSIS — R229 Localized swelling, mass and lump, unspecified: Secondary | ICD-10-CM

## 2012-03-04 ENCOUNTER — Ambulatory Visit: Admission: RE | Admit: 2012-03-04 | Payer: 59 | Source: Ambulatory Visit

## 2012-03-08 ENCOUNTER — Other Ambulatory Visit: Payer: Self-pay | Admitting: Internal Medicine

## 2012-03-08 ENCOUNTER — Ambulatory Visit (INDEPENDENT_AMBULATORY_CARE_PROVIDER_SITE_OTHER): Payer: 59 | Admitting: Internal Medicine

## 2012-03-08 VITALS — BP 130/84 | HR 76 | Temp 98.6°F | Resp 16 | Ht 70.0 in | Wt 226.0 lb

## 2012-03-08 DIAGNOSIS — N644 Mastodynia: Secondary | ICD-10-CM

## 2012-03-08 DIAGNOSIS — M7989 Other specified soft tissue disorders: Secondary | ICD-10-CM

## 2012-03-08 NOTE — Assessment & Plan Note (Addendum)
Venous doppler negative for DVT in left upper arm.  Patient scheduled to diagnostic mammogram and left breast ultrasound.   She is having persistent symptoms.   She feels abnormal sensation in her left arm when pressure applied to her left upper chest.  If left breast studies are normal, we discussed obtaining CT of chest with IV contrast.

## 2012-03-08 NOTE — Progress Notes (Signed)
  Subjective:    Patient ID: Catherine [name], female    DOB: 1971/01/10, 41 y.o.   MRN: [id number]  HPI  41 y/o female previously seen for swelling and pain of her left axilla for follow up.  Venous doppler was negative for DVT.  She is experiencing persistent discomfort.  She describes a constant pressure like sensation.    We are awaiting results of diagnostic mammo and left breast u/s.  She was recently seen at urgent care for possible UTI and kidney stone.  She was prescribed bactrim DS and tramadol.    Review of Systems   negative for fever or chills  Past Medical History  Diagnosis Date  . Anxiety   . Depression   . Migraine   . Cystitis, interstitial     History   Social History  . Marital Status: Divorced    Spouse Name: N/A    Number of Children: N/A  . Years of Education: N/A   Occupational History  . Not on file.   Social History Main Topics  . Smoking status: Current Everyday Smoker  . Smokeless tobacco: Not on file  . Alcohol Use: Yes  . Drug Use: Not on file  . Sexually Active: Not on file   Other Topics Concern  . Not on file   Social History Narrative  . No narrative on file    Past Surgical History  Procedure Date  . Abdominal hysterectomy   . Total abdominal hysterectomy w/ bilateral salpingoophorectomy   . Cystoscopy     Family History  Problem Relation Age of Onset  . Sudden death Mother     suicide    Allergies  Allergen Reactions  . Amoxicillin-Pot Clavulanate     REACTION: SEVERE STOMACH CRAMPING  . Divalproex Sodium     REACTION: nausea  . Latex     REACTION: contact dermatitis  . Penicillins     Current Outpatient Prescriptions on File Prior to Visit  Medication Sig Dispense Refill  . albuterol (PROVENTIL HFA;VENTOLIN HFA) 108 (90 BASE) MCG/ACT inhaler Inhale 2 puffs into the lungs every 4 (four) hours as needed for wheezing.  1 Inhaler  0  . citalopram (CELEXA) 10 MG tablet Take 10 mg by mouth daily.      Marland Kitchen  gabapentin (NEURONTIN) 400 MG capsule Take 400 mg by mouth 3 (three) times daily.      Marland Kitchen lamoTRIgine (LAMICTAL) 200 MG tablet Take 200 mg by mouth daily.      Marland Kitchen lisdexamfetamine (VYVANSE) 30 MG capsule Take 30 mg by mouth every morning.      . zolmitriptan (ZOMIG-ZMT) 5 MG disintegrating tablet Take 5 mg by mouth as needed.        BP 130/84  Pulse 76  Temp 98.6 F (37 C)  Resp 16  Ht 5\' 10"  (1.778 m)  Wt 226 lb (102.513 kg)  BMI 32.43 kg/m2    Objective:   Physical Exam  Constitutional: She appears well-developed and well-nourished.  Cardiovascular: Normal rate, regular rhythm and normal heart sounds.   Pulmonary/Chest: Effort normal and breath sounds normal. She has no wheezes.  Musculoskeletal:       Slight tenderness of left upper chest area that radiates to arm  Lymphadenopathy:       Right axillary: No pectoral and no lateral adenopathy present.       Left axillary: No pectoral and no lateral adenopathy present.         Assessment & Plan:

## 2012-03-16 ENCOUNTER — Ambulatory Visit
Admission: RE | Admit: 2012-03-16 | Discharge: 2012-03-16 | Disposition: A | Discharge status: Home / Self Care | Payer: 59 | Source: Ambulatory Visit | Attending: Internal Medicine | Admitting: Internal Medicine

## 2012-03-16 DIAGNOSIS — N644 Mastodynia: Secondary | ICD-10-CM

## 2012-03-23 ENCOUNTER — Other Ambulatory Visit: Payer: Self-pay | Admitting: Internal Medicine

## 2012-03-23 DIAGNOSIS — M79622 Pain in left upper arm: Secondary | ICD-10-CM

## 2012-03-24 ENCOUNTER — Encounter: Payer: Self-pay | Admitting: Internal Medicine

## 2012-03-24 ENCOUNTER — Ambulatory Visit (INDEPENDENT_AMBULATORY_CARE_PROVIDER_SITE_OTHER): Payer: 59 | Admitting: Internal Medicine

## 2012-03-24 VITALS — BP 132/98 | Temp 98.5°F | Wt 226.0 lb

## 2012-03-24 DIAGNOSIS — F319 Bipolar disorder, unspecified: Secondary | ICD-10-CM

## 2012-03-24 DIAGNOSIS — M7989 Other specified soft tissue disorders: Secondary | ICD-10-CM

## 2012-03-24 DIAGNOSIS — M5412 Radiculopathy, cervical region: Secondary | ICD-10-CM

## 2012-03-24 NOTE — Progress Notes (Signed)
Patient ID: Catherine [name], female   DOB: Apr 04, 1971, 41 y.o.   MRN: [id number]  At least 6 weeks of arm tingling and upper outer breast (left) pain. She will also have some shooting discomfort down left arm. In addition she admits to neck pain but she has always thought that the neck pain was somehow related to migraine headache.   Past Medical History  Diagnosis Date  . Anxiety   . Depression   . Migraine   . Cystitis, interstitial     History   Social History  . Marital Status: Divorced    Spouse Name: N/A    Number of Children: N/A  . Years of Education: N/A   Occupational History  . Not on file.   Social History Main Topics  . Smoking status: Current Everyday Smoker  . Smokeless tobacco: Not on file  . Alcohol Use: Yes  . Drug Use: Not on file  . Sexually Active: Not on file   Other Topics Concern  . Not on file   Social History Narrative  . No narrative on file    Past Surgical History  Procedure Date  . Abdominal hysterectomy   . Total abdominal hysterectomy w/ bilateral salpingoophorectomy   . Cystoscopy     Family History  Problem Relation Age of Onset  . Sudden death Mother     suicide    Allergies  Allergen Reactions  . Amoxicillin-Pot Clavulanate     REACTION: SEVERE STOMACH CRAMPING  . Divalproex Sodium     REACTION: nausea  . Latex     REACTION: contact dermatitis  . Penicillins     Current Outpatient Prescriptions on File Prior to Visit  Medication Sig Dispense Refill  . albuterol (PROVENTIL HFA;VENTOLIN HFA) 108 (90 BASE) MCG/ACT inhaler Inhale 2 puffs into the lungs every 4 (four) hours as needed for wheezing.  1 Inhaler  0  . gabapentin (NEURONTIN) 400 MG capsule Take 400 mg by mouth 3 (three) times daily.      Marland Kitchen lamoTRIgine (LAMICTAL) 200 MG tablet Take 200 mg by mouth daily.      Marland Kitchen lisdexamfetamine (VYVANSE) 30 MG capsule Take 30 mg by mouth every morning.      . zolmitriptan (ZOMIG-ZMT) 5 MG disintegrating tablet Take 5 mg by  mouth as needed.         patient denies chest pain, shortness of breath, orthopnea. Denies lower extremity edema, abdominal pain, change in appetite, change in bowel movements. Patient denies rashes, musculoskeletal complaints. No other specific complaints in a complete review of systems.   BP 132/98  Temp 98.5 F (36.9 C) (Oral)  Wt 226 lb (102.513 kg)  Well-developed well-nourished female in no acute distress. HEENT exam atraumatic, normocephalic, extraocular muscles are intact. Neck is supple. No jugular venous distention no thyromegaly. Chest clear to auscultation without increased work of breathing. Cardiac exam S1 and S2 are regular. Abdominal exam active bowel sounds, soft, nontender. Extremities no edema. Neurologic exam she is alert without any motor sensory deficits. Gait is normal.   Left arm pain-- suspect radiculopathy MRI

## 2012-03-24 NOTE — Assessment & Plan Note (Signed)
She has a subjective sense of swelling but I think her sxs are more likely related to a radiculopathy. Given duration and progressive sxs I think she needs MRI neck-- will schedule

## 2012-03-28 ENCOUNTER — Ambulatory Visit: Payer: 59 | Admitting: Internal Medicine

## 2012-04-04 ENCOUNTER — Ambulatory Visit
Admission: RE | Admit: 2012-04-04 | Discharge: 2012-04-04 | Disposition: A | Discharge status: Home / Self Care | Payer: 59 | Source: Ambulatory Visit | Attending: Internal Medicine | Admitting: Internal Medicine

## 2012-04-04 DIAGNOSIS — M7989 Other specified soft tissue disorders: Secondary | ICD-10-CM

## 2012-04-05 ENCOUNTER — Other Ambulatory Visit: Payer: 59

## 2013-04-19 ENCOUNTER — Encounter: Payer: Self-pay | Admitting: Family Medicine

## 2013-04-19 ENCOUNTER — Ambulatory Visit (INDEPENDENT_AMBULATORY_CARE_PROVIDER_SITE_OTHER): Payer: Managed Care, Other (non HMO) | Admitting: Family Medicine

## 2013-04-19 VITALS — BP 120/84 | Temp 98.7°F | Wt 251.0 lb

## 2013-04-19 DIAGNOSIS — R300 Dysuria: Secondary | ICD-10-CM

## 2013-04-19 DIAGNOSIS — M549 Dorsalgia, unspecified: Secondary | ICD-10-CM

## 2013-04-19 LAB — POCT URINALYSIS DIPSTICK
Bilirubin, UA: NEGATIVE
Glucose, UA: NEGATIVE
Leukocytes, UA: NEGATIVE
Nitrite, UA: NEGATIVE

## 2013-04-19 NOTE — Patient Instructions (Signed)
-  heat 15 minutes 2x daily  -tylenol 500-1000mg  up to 3 times daily if needed for pain  -exercises provided  -topical sports creams can be helpful  -follow up with your doctor if persists in 3 weeks or sooner if needed or other concerns

## 2013-04-19 NOTE — Addendum Note (Signed)
Addended by: Azucena Freed on: 04/19/2013 11:31 AM   Modules accepted: Orders

## 2013-04-19 NOTE — Progress Notes (Signed)
Chief Complaint  Patient presents with  . Back Pain    HPI:  Catherine [name] is a 42 yo F pt of Dr. Cato Mulligan with PMH interstitial cystitis, anxiety and depression, Bipolar disorder here for an acute visit for back pain: -started: yesterday -symptoms: R sided back pain - constant, sharp at times with certain movement, mod pain -denies:dysuria, hematuria, fevers, chills, NVD, severe pain, weakness, numbness radiation -has tried: has taken advil and drinking water -reports hx of: kidney stone and UTI and she wants to check for this - never confirmed on imaging or analyzized but told to drink water and take abx in the past  ROS: See pertinent positives and negatives per HPI.  Past Medical History  Diagnosis Date  . Anxiety   . Depression   . Migraine   . Cystitis, interstitial     Family History  Problem Relation Age of Onset  . Sudden death Mother     suicide    History   Social History  . Marital Status: Divorced    Spouse Name: N/A    Number of Children: N/A  . Years of Education: N/A   Social History Main Topics  . Smoking status: Current Every Day Smoker  . Smokeless tobacco: None  . Alcohol Use: Yes  . Drug Use: None  . Sexual Activity: None   Other Topics Concern  . None   Social History Narrative  . None    Current outpatient prescriptions:lisdexamfetamine (VYVANSE) 30 MG capsule, Take 30 mg by mouth every morning., Disp: , Rfl: ;  zolmitriptan (ZOMIG-ZMT) 5 MG disintegrating tablet, Take 5 mg by mouth as needed., Disp: , Rfl:   EXAM:  Filed Vitals:   04/19/13 1052  BP: 120/84  Temp: 98.7 F (37.1 C)    Body mass index is 36.01 kg/(m^2).  GENERAL: vitals reviewed and listed above, alert, oriented, appears well hydrated and in no acute distress  HEENT: atraumatic, conjunttiva clear, no obvious abnormalities on inspection of external nose and ears  NECK: no obvious masses on inspection  LUNGS: clear to auscultation bilaterally, no wheezes,  rales or rhonchi, good air movement  CV: HRRR, no peripheral edema  ABD: BS+, soft, NTTP, no CVA TTP  MS: moves all extremities without noticeable abnormality - TTP in back muscles on R - lumbar, LD - increased with activation of these muscles  PSYCH: pleasant and cooperative, no obvious depression or anxiety  ASSESSMENT AND PLAN:  Discussed the following assessment and plan:  Dysuria - Plan: POCT urinalysis dipstick  Back pain  -pt with back pain and actually denies dysuria, urine dip normal - will obtain culture just in case UTI given her concern - but exam suggests muscle strain -advised heat, tylenol, topical sports creams, HEP - return precuations -Patient advised to return or notify a doctor immediately if symptoms worsen or persist or new concerns arise.  There are no Patient Instructions on file for this visit.   Kriste Basque R.

## 2013-04-20 LAB — URINE CULTURE: Colony Count: 30000

## 2013-04-21 NOTE — Progress Notes (Signed)
Quick Note:  Left a message for pt to return call. ______ 

## 2013-04-21 NOTE — Progress Notes (Signed)
Quick Note:  Called and spoke with pt and pt is aware. ______ 

## 2013-07-06 ENCOUNTER — Other Ambulatory Visit: Payer: Self-pay

## 2014-01-01 ENCOUNTER — Encounter: Payer: Self-pay | Admitting: Family Medicine

## 2014-01-01 ENCOUNTER — Ambulatory Visit (INDEPENDENT_AMBULATORY_CARE_PROVIDER_SITE_OTHER): Payer: 59 | Admitting: Family Medicine

## 2014-01-01 VITALS — BP 140/90 | HR 78 | Temp 98.9°F | Ht 70.0 in | Wt 257.4 lb

## 2014-01-01 DIAGNOSIS — J3489 Other specified disorders of nose and nasal sinuses: Secondary | ICD-10-CM

## 2014-01-01 DIAGNOSIS — R3915 Urgency of urination: Secondary | ICD-10-CM

## 2014-01-01 DIAGNOSIS — M545 Low back pain, unspecified: Secondary | ICD-10-CM

## 2014-01-01 DIAGNOSIS — R0981 Nasal congestion: Secondary | ICD-10-CM

## 2014-01-01 LAB — POCT URINALYSIS DIPSTICK
BILIRUBIN UA: NEGATIVE
Blood, UA: NEGATIVE
GLUCOSE UA: NEGATIVE
Ketones, UA: NEGATIVE
LEUKOCYTES UA: NEGATIVE
NITRITE UA: NEGATIVE
PH UA: 6
Protein, UA: NEGATIVE
Spec Grav, UA: 1.015
Urobilinogen, UA: 0.2

## 2014-01-01 MED ORDER — CYCLOBENZAPRINE HCL 5 MG PO TABS: 5.0000 mg | ORAL_TABLET | Freq: Three times a day (TID) | ORAL | Status: DC | PRN

## 2014-01-01 MED ORDER — FLUTICASONE PROPIONATE 50 MCG/ACT NA SUSP: 2.0000 | Freq: Every day | NASAL | Status: DC

## 2014-01-01 NOTE — Progress Notes (Signed)
No chief complaint on file.   HPI:  Catherine [name] is a 43 yo F pt of Dr. Cato MulliganSwords with PMH migraine, IC, Bipolar disorder, Anxiety, tobacco use and alcohol abuse here for acute visit for:  1)Sinus pressure: -started about 1 week ago -irritated watery eyes too -a little chest pain that last for a few seconds at rest with certain movements  2)Back Pain: -on ROC, presented with same symptoms in the past and found to be muscular and resolved -reports: mild urinary urgency, low back pain -denies:fevers, vomiting, flank pain, hematuria, vaginal discharge -FDLMP: N/A s/p hysterectomy   ROS: See pertinent positives and negatives per HPI.  Past Medical History  Diagnosis Date  . Anxiety   . Depression   . Migraine   . Cystitis, interstitial     Past Surgical History  Procedure Laterality Date  . Abdominal hysterectomy    . Total abdominal hysterectomy w/ bilateral salpingoophorectomy    . Cystoscopy      Family History  Problem Relation Age of Onset  . Sudden death Mother     suicide    History   Social History  . Marital Status: Divorced    Spouse Name: N/A    Number of Children: N/A  . Years of Education: N/A   Social History Main Topics  . Smoking status: Current Every Day Smoker  . Smokeless tobacco: None  . Alcohol Use: Yes  . Drug Use: None  . Sexual Activity: None   Other Topics Concern  . None   Social History Narrative  . None    Current outpatient prescriptions:cyclobenzaprine (FLEXERIL) 5 MG tablet, Take 1 tablet (5 mg total) by mouth 3 (three) times daily as needed for muscle spasms., Disp: 30 tablet, Rfl: 1;  fluticasone (FLONASE) 50 MCG/ACT nasal spray, Place 2 sprays into both nostrils daily., Disp: 16 g, Rfl: 1  EXAM:  Filed Vitals:   01/01/14 0925  BP: 140/90  Pulse: 78  Temp: 98.9 F (37.2 C)    Body mass index is 36.93 kg/(m^2).  GENERAL: vitals reviewed and listed above, alert, oriented, appears well hydrated and in no acute  distress  HEENT: atraumatic, conjunttiva clear, no obvious abnormalities on inspection of external nose and ears, normal appearance of ear canals and TMs, clear nasal congestion, mild post oropharyngeal erythema with PND, no tonsillar edema or exudate, no sinus TTP  NECK: no obvious masses on inspection  LUNGS: clear to auscultation bilaterally, no wheezes, rales or rhonchi, good air movement  ABD: no CVA TTP  CV: HRRR, no peripheral edema  MS: moves all extremities without noticeable abnormality Gait normal, TTP QL lower lumbar region on R, otherwise normal exam, CC junction upper rib TTP  PSYCH: pleasant and cooperative, no obvious depression or anxiety  ASSESSMENT AND PLAN:  Discussed the following assessment and plan:  Urinary urgency - Plan: POC Urinalysis Dipstick  Low back pain - Plan: cyclobenzaprine (FLEXERIL) 5 MG tablet  Nasal sinus congestion - Plan: fluticasone (FLONASE) 50 MCG/ACT nasal spray  -udip normal, R QL muscle strain - advised HEP, heat, muscle relaxer, supportive care and return in 3-4 weeks -allergy treatment with antihistamine and INS, follow up if persists or worsens -CP reproducible on exam and likely musculoskeletal - no features to suggest cardiac or serious etiology but discuss causes and workup for CP and opted for aleve and return precuations -Patient advised to return or notify a doctor immediately if symptoms worsen or persist or new concerns arise.  Patient Instructions  AFRIN for 4 days then stop  Flonase 2 sprays each nostril daily for 21 days  Muscle relaxer for back once nightly for a few days  Aleve per instructions as needed  Heat for back for 15 minutes twice daily  Do the back exercises at least 4 days per week  Follow up as needed and in 3-4 weeks if back pain not improving     Catherine [name]

## 2014-01-01 NOTE — Patient Instructions (Signed)
AFRIN for 4 days then stop  Flonase 2 sprays each nostril daily for 21 days  Muscle relaxer for back once nightly for a few days  Aleve per instructions as needed  Heat for back for 15 minutes twice daily  Do the back exercises at least 4 days per week  Follow up as needed and in 3-4 weeks if back pain not improving

## 2014-01-01 NOTE — Progress Notes (Signed)
Pre visit review using our clinic review tool, if applicable. No additional management support is needed unless otherwise documented below in the visit note. 

## 2014-01-02 ENCOUNTER — Telehealth: Payer: Self-pay | Admitting: Internal Medicine

## 2014-01-02 NOTE — Telephone Encounter (Signed)
Relevant patient education assigned to patient using Emmi. ° °

## 2014-07-03 ENCOUNTER — Telehealth: Payer: Self-pay | Admitting: Internal Medicine

## 2014-07-03 NOTE — Telephone Encounter (Signed)
Error/njr °

## 2014-07-04 ENCOUNTER — Ambulatory Visit (INDEPENDENT_AMBULATORY_CARE_PROVIDER_SITE_OTHER): Payer: 59 | Admitting: Family Medicine

## 2014-07-04 ENCOUNTER — Encounter: Payer: Self-pay | Admitting: Family Medicine

## 2014-07-04 VITALS — BP 154/107 | HR 111 | Temp 99.0°F | Ht 70.0 in | Wt 254.0 lb

## 2014-07-04 DIAGNOSIS — F411 Generalized anxiety disorder: Secondary | ICD-10-CM

## 2014-07-04 DIAGNOSIS — R51 Headache: Secondary | ICD-10-CM

## 2014-07-04 DIAGNOSIS — F319 Bipolar disorder, unspecified: Secondary | ICD-10-CM

## 2014-07-04 DIAGNOSIS — R519 Headache, unspecified: Secondary | ICD-10-CM

## 2014-07-04 MED ORDER — LAMOTRIGINE 200 MG PO TABS: 200.0000 mg | ORAL_TABLET | Freq: Every day | ORAL | Status: DC

## 2014-07-04 MED ORDER — LISDEXAMFETAMINE DIMESYLATE 30 MG PO CAPS: 30.0000 mg | ORAL_CAPSULE | Freq: Every day | ORAL | Status: DC

## 2014-07-04 MED ORDER — ARIPIPRAZOLE 5 MG PO TABS: 5.0000 mg | ORAL_TABLET | Freq: Every day | ORAL | Status: DC

## 2014-07-04 NOTE — Progress Notes (Signed)
Pre visit review using our clinic review tool, if applicable. No additional management support is needed unless otherwise documented below in the visit note. 

## 2014-07-04 NOTE — Progress Notes (Signed)
   Subjective:    Patient ID: Catherine CheadleKimberly D Lormand, female    DOB: March 02, 1971, 43 y.o. y.o.   MRN: [id number]  HPI Here asking for advice. She was previously a patient of Dr. Cato MulliganSwords and now needs to find another PCP. She has a hx of bipolar disorder and had been seeing someone at the office of Dr. Emerson MonteParrish McKinney. This provider moved away and she has to establish with someone new. However they now consider her a "new patient" and she cannot be seen until December. She has run out of meds and she is very anxious and has a lot of mood swings. She had been on Abilify and Lamictal, and she had been taking Vyvanse as well. To make matters worse she was fired from her job 2 weeks ago and she is looking fir work.    Review of Systems  Constitutional: Negative.   Respiratory: Negative.   Cardiovascular: Negative.   Neurological: Positive for headaches.  Psychiatric/Behavioral: Positive for sleep disturbance, dysphoric mood, decreased concentration and agitation. Negative for suicidal ideas, hallucinations, behavioral problems, confusion and self-injury. The patient is nervous/anxious.        Objective:   Physical Exam  Constitutional: She is oriented to person, place, and time. She appears well-developed and well-nourished.  Cardiovascular: Normal rate, regular rhythm, normal heart sounds and intact distal pulses.   Pulmonary/Chest: Effort normal and breath sounds normal.  Neurological: She is alert and oriented to person, place, and time.  Psychiatric: Her behavior is normal. Thought content normal.  Very anxious, on the verge of tears, appropriate and polite           Assessment & Plan:  We will refill her previous meds until she can get re-established at Dr. Loralie ChampagneMcKinney's office.

## 2014-09-04 ENCOUNTER — Encounter: Payer: Self-pay | Admitting: Family

## 2014-09-04 ENCOUNTER — Ambulatory Visit: Payer: No Typology Code available for payment source | Admitting: Family

## 2014-09-04 VITALS — BP 158/102 | HR 104 | Temp 98.6°F | Ht 70.0 in | Wt 256.6 lb

## 2014-09-04 DIAGNOSIS — F3162 Bipolar disorder, current episode mixed, moderate: Secondary | ICD-10-CM

## 2014-09-04 DIAGNOSIS — J309 Allergic rhinitis, unspecified: Secondary | ICD-10-CM

## 2014-09-04 DIAGNOSIS — I10 Essential (primary) hypertension: Secondary | ICD-10-CM

## 2014-09-04 LAB — COMPREHENSIVE METABOLIC PANEL
ALBUMIN: 4.3 g/dL (ref 3.5–5.2)
ALT: 21 U/L (ref 0–35)
AST: 21 U/L (ref 0–37)
Alkaline Phosphatase: 81 U/L (ref 39–117)
BUN: 8 mg/dL (ref 6–23)
CALCIUM: 9.6 mg/dL (ref 8.4–10.5)
CHLORIDE: 106 meq/L (ref 96–112)
CO2: 24 mEq/L (ref 19–32)
Creatinine, Ser: 0.6 mg/dL (ref 0.4–1.2)
GFR: 111.41 mL/min (ref >60.00)
GLUCOSE: 85 mg/dL (ref 70–99)
POTASSIUM: 4.7 meq/L (ref 3.5–5.1)
Sodium: 141 mEq/L (ref 135–145)
TOTAL PROTEIN: 7.4 g/dL (ref 6.0–8.3)
Total Bilirubin: 0.6 mg/dL (ref 0.2–1.2)

## 2014-09-04 MED ORDER — LISINOPRIL-HYDROCHLOROTHIAZIDE 20-25 MG PO TABS: 0.5000 | ORAL_TABLET | Freq: Every day | ORAL | Status: DC

## 2014-09-04 NOTE — Progress Notes (Signed)
Subjective:    Patient ID: Catherine [name], female    DOB: 04/29/1971, 44 y.o.   MRN: [id number]  HPI 44 year old white female, one pack per day smoker, with a history of bipolar disorder, hypertension, obesity is in today to be established. Reports has been ear pain and drainage 4-5 months. Takes Flonase 1 spray in each nostril daily that helps some. Does not take any over-the-counter medications. Does not routinely exercise. Last mammogram was 2 months ago and normal. Sees gynecology for female care.   Review of Systems  Constitutional: Negative.   HENT: Positive for congestion, postnasal drip, sneezing and sore throat.   Respiratory: Negative.   Cardiovascular: Negative.   Gastrointestinal: Negative.   Endocrine: Negative.   Genitourinary: Negative.   Musculoskeletal: Negative.   Skin: Negative.   Allergic/Immunologic: Negative.   Neurological: Negative.   Psychiatric/Behavioral: Negative.    Past Medical History  Diagnosis Date  . Anxiety   . Depression   . Migraine   . Cystitis, interstitial     History   Social History  . Marital Status: Divorced    Spouse Name: N/A    Number of Children: N/A  . Years of Education: N/A   Occupational History  . Not on file.   Social History Main Topics  . Smoking status: Current Every Day Smoker -- 1.00 packs/day    Types: Cigarettes  . Smokeless tobacco: Never Used  . Alcohol Use: 0.0 oz/week    0 Not specified per week     Comment: occ  . Drug Use: No  . Sexual Activity: Not on file   Other Topics Concern  . Not on file   Social History Narrative    Past Surgical History  Procedure Laterality Date  . Abdominal hysterectomy    . Total abdominal hysterectomy w/ bilateral salpingoophorectomy    . Cystoscopy      Family History  Problem Relation Age of Onset  . Sudden death Mother     suicide    Allergies  Allergen Reactions  . Amoxicillin-Pot Clavulanate     REACTION: SEVERE STOMACH CRAMPING  .  Divalproex Sodium     REACTION: nausea  . Latex     REACTION: contact dermatitis  . Penicillins     Current Outpatient Prescriptions on File Prior to Visit  Medication Sig Dispense Refill  . ARIPiprazole (ABILIFY) 5 MG tablet Take 1 tablet (5 mg total) by mouth daily. 30 tablet 2  . cyclobenzaprine (FLEXERIL) 5 MG tablet Take 1 tablet (5 mg total) by mouth 3 (three) times daily as needed for muscle spasms. 30 tablet 1  . fluticasone (FLONASE) 50 MCG/ACT nasal spray Place 2 sprays into both nostrils daily. 16 g 1  . lamoTRIgine (LAMICTAL) 200 MG tablet Take 1 tablet (200 mg total) by mouth daily. 30 tablet 2  . lisdexamfetamine (VYVANSE) 30 MG capsule Take 1 capsule (30 mg total) by mouth daily. 30 capsule 0   No current facility-administered medications on file prior to visit.    BP 158/102 mmHg  Pulse 104  Temp(Src) 98.6 F (37 C) (Oral)  Ht  (1.778 m)  Wt 256 lb 9.6 oz (116.393 kg)  BMI 36.82 kg/m2chart     Objective:   Physical Exam  Constitutional: She is oriented to person, place, and time. She appears well-developed and well-nourished.  HENT:  Right Ear: External ear normal.  Left Ear: External ear normal.  Nose: Nose normal.  Mouth/Throat: Oropharynx is clear and moist.  Neck: Normal range of motion. Neck supple. No thyromegaly present.  Cardiovascular: Normal rate, regular rhythm and normal heart sounds.   Pulmonary/Chest: Effort normal and breath sounds normal.  Musculoskeletal: Normal range of motion.  Neurological: She is alert and oriented to person, place, and time.  Skin: Skin is warm and dry.  Psychiatric: She has a normal mood and affect.          Assessment & Plan:  Catherine [name] was seen today for establish care.  Diagnoses and associated orders for this visit:  Essential hypertension - CMP  Allergic rhinitis, unspecified allergic rhinitis type - CMP  Bipolar 1 disorder, mixed, moderate - CMP  Other  Orders - lisinopril-hydrochlorothiazide (PRINZIDE,ZESTORETIC) 20-25 MG per tablet; Take 0.5 tablets by mouth daily.    Strongly encourage weight reduction, smoking cessation, monthly self breast exams. Resume blood pressure medication lisinopril HCT one half tablet daily. Recheck in 2 weeks. Add Claritin or Zyrtec to Flonase. Flonase should be 2 sprays in each nostril once a day. Call the office with any questions or concerns.

## 2014-09-04 NOTE — Patient Instructions (Signed)

## 2014-09-04 NOTE — Progress Notes (Signed)
Pre visit review using our clinic review tool, if applicable. No additional management support is needed unless otherwise documented below in the visit note. 

## 2014-09-06 ENCOUNTER — Telehealth: Payer: Self-pay | Admitting: Family

## 2014-09-06 NOTE — Telephone Encounter (Signed)
emmi emailed °

## 2014-09-07 ENCOUNTER — Other Ambulatory Visit: Payer: Self-pay | Admitting: Family Medicine

## 2014-09-11 NOTE — Telephone Encounter (Signed)
She sees Catherine [name] for this

## 2014-09-25 ENCOUNTER — Encounter: Payer: Self-pay | Admitting: Family

## 2014-09-25 ENCOUNTER — Ambulatory Visit (INDEPENDENT_AMBULATORY_CARE_PROVIDER_SITE_OTHER): Payer: No Typology Code available for payment source | Admitting: Family

## 2014-09-25 VITALS — BP 120/88 | HR 119 | Temp 98.2°F | Wt 253.0 lb

## 2014-09-25 DIAGNOSIS — E669 Obesity, unspecified: Secondary | ICD-10-CM

## 2014-09-25 DIAGNOSIS — M545 Low back pain: Secondary | ICD-10-CM

## 2014-09-25 DIAGNOSIS — I10 Essential (primary) hypertension: Secondary | ICD-10-CM

## 2014-09-25 MED ORDER — CYCLOBENZAPRINE HCL 5 MG PO TABS: 5.0000 mg | ORAL_TABLET | Freq: Three times a day (TID) | ORAL | Status: DC | PRN

## 2014-09-25 NOTE — Progress Notes (Signed)
Subjective:    Patient ID: Catherine [name], female    DOB: 1970-10-15, 44 y.o.   MRN: [id number]  HPI 44 year old white female, nonsmoker with a history of bipolar disorder and hypertension is in today for recheck of hypertension. At her last office visit we started lisinopril HCT 20/25 one half tablet a day. She's tolerating the medication well. Blood pressure readings have been in the 120s over 80s systolically. Not exercising but tries to follow healthy diet.   Review of Systems  Constitutional: Negative.   HENT: Negative.   Respiratory: Negative.   Cardiovascular: Negative.   Gastrointestinal: Negative.   Endocrine: Negative.   Genitourinary: Negative.   Musculoskeletal: Negative.   Skin: Negative.   Allergic/Immunologic: Negative.   Neurological: Negative.   Hematological: Negative.   Psychiatric/Behavioral: Negative.    Past Medical History  Diagnosis Date  . Anxiety   . Depression   . Migraine   . Cystitis, interstitial     History   Social History  . Marital Status: Divorced    Spouse Name: N/A    Number of Children: N/A  . Years of Education: N/A   Occupational History  . Not on file.   Social History Main Topics  . Smoking status: Current Every Day Smoker -- 1.00 packs/day    Types: Cigarettes  . Smokeless tobacco: Never Used  . Alcohol Use: 0.0 oz/week    0 Not specified per week     Comment: occ  . Drug Use: No  . Sexual Activity: Not on file   Other Topics Concern  . Not on file   Social History Narrative    Past Surgical History  Procedure Laterality Date  . Abdominal hysterectomy    . Total abdominal hysterectomy w/ bilateral salpingoophorectomy    . Cystoscopy      Family History  Problem Relation Age of Onset  . Sudden death Mother     suicide    Allergies  Allergen Reactions  . Amoxicillin-Pot Clavulanate     REACTION: SEVERE STOMACH CRAMPING  . Divalproex Sodium     REACTION: nausea  . Latex     REACTION: contact  dermatitis  . Penicillins     Current Outpatient Prescriptions on File Prior to Visit  Medication Sig Dispense Refill  . fluticasone (FLONASE) 50 MCG/ACT nasal spray Place 2 sprays into both nostrils daily. 16 g 1  . lamoTRIgine (LAMICTAL) 200 MG tablet Take 1 tablet (200 mg total) by mouth daily. 30 tablet 2  . lisinopril-hydrochlorothiazide (PRINZIDE,ZESTORETIC) 20-25 MG per tablet Take 0.5 tablets by mouth daily. 15 tablet 3   No current facility-administered medications on file prior to visit.    BP 120/88 mmHg  Pulse 119  Temp(Src) 98.2 F (36.8 C) (Oral)  Wt 253 lb (114.76 kg)chart    Objective:   Physical Exam  Constitutional: She is oriented to person, place, and time. She appears well-developed and well-nourished.  HENT:  Right Ear: External ear normal.  Left Ear: External ear normal.  Nose: Nose normal.  Mouth/Throat: Oropharynx is clear and moist.  Neck: Normal range of motion. Neck supple. No thyromegaly present.  Cardiovascular: Normal rate, regular rhythm and normal heart sounds.   Pulmonary/Chest: Effort normal and breath sounds normal.  Abdominal: Soft. Bowel sounds are normal.  Musculoskeletal: Normal range of motion.  Neurological: She is alert and oriented to person, place, and time.  Skin: Skin is warm and dry.  Psychiatric: She has a normal mood and affect.  Assessment & Plan:  Catherine [name] was seen today for follow-up.  Diagnoses and associated orders for this visit:  Essential hypertension  Low back pain without sciatica, unspecified back pain laterality - cyclobenzaprine (FLEXERIL) 5 MG tablet; Take 1 tablet (5 mg total) by mouth 3 (three) times daily as needed for muscle spasms.  Obesity    Low-sodium diet. Continue current medication. Exercise to reduce weight. Recheck in 4 months and sooner as needed.

## 2014-09-25 NOTE — Patient Instructions (Signed)
Fat and Cholesterol Control Diet Fat and cholesterol levels in your blood and organs are influenced by your diet. High levels of fat and cholesterol may lead to diseases of the heart, small and large blood vessels, gallbladder, liver, and pancreas. CONTROLLING FAT AND CHOLESTEROL WITH DIET Although exercise and lifestyle factors are important, your diet is key. That is because certain foods are known to raise cholesterol and others to lower it. The goal is to balance foods for their effect on cholesterol and more importantly, to replace saturated and trans fat with other types of fat, such as monounsaturated fat, polyunsaturated fat, and omega-3 fatty acids. On average, a person should consume no more than 15 to 17 g of saturated fat daily. Saturated and trans fats are considered "bad" fats, and they will raise LDL cholesterol. Saturated fats are primarily found in animal products such as meats, butter, and cream. However, that does not mean you need to give up all your favorite foods. Today, there are good tasting, low-fat, low-cholesterol substitutes for most of the things you like to eat. Choose low-fat or nonfat alternatives. Choose round or loin cuts of red meat. These types of cuts are lowest in fat and cholesterol. Chicken (without the skin), fish, veal, and ground turkey breast are great choices. Eliminate fatty meats, such as hot dogs and salami. Even shellfish have little or no saturated fat. Have a 3 oz (85 g) portion when you eat lean meat, poultry, or fish. Trans fats are also called "partially hydrogenated oils." They are oils that have been scientifically manipulated so that they are solid at room temperature resulting in a longer shelf life and improved taste and texture of foods in which they are added. Trans fats are found in stick margarine, some tub margarines, cookies, crackers, and baked goods.  When baking and cooking, oils are a great substitute for butter. The monounsaturated oils are  especially beneficial since it is believed they lower LDL and raise HDL. The oils you should avoid entirely are saturated tropical oils, such as coconut and palm.  Remember to eat a lot from food groups that are naturally free of saturated and trans fat, including fish, fruit, vegetables, beans, grains (barley, rice, couscous, bulgur wheat), and pasta (without cream sauces).  IDENTIFYING FOODS THAT LOWER FAT AND CHOLESTEROL  Soluble fiber may lower your cholesterol. This type of fiber is found in fruits such as apples, vegetables such as broccoli, potatoes, and carrots, legumes such as beans, peas, and lentils, and grains such as barley. Foods fortified with plant sterols (phytosterol) may also lower cholesterol. You should eat at least 2 g per day of these foods for a cholesterol lowering effect.  Read package labels to identify low-saturated fats, trans fat free, and low-fat foods at the supermarket. Select cheeses that have only 2 to 3 g saturated fat per ounce. Use a heart-healthy tub margarine that is free of trans fats or partially hydrogenated oil. When buying baked goods (cookies, crackers), avoid partially hydrogenated oils. Breads and muffins should be made from whole grains (whole-wheat or whole oat flour, instead of "flour" or "enriched flour"). Buy non-creamy canned soups with reduced salt and no added fats.  FOOD PREPARATION TECHNIQUES  Never deep-fry. If you must fry, either stir-fry, which uses very little fat, or use non-stick cooking sprays. When possible, broil, bake, or roast meats, and steam vegetables. Instead of putting butter or margarine on vegetables, use lemon and herbs, applesauce, and cinnamon (for squash and sweet potatoes). Use nonfat   yogurt, salsa, and low-fat dressings for salads.  LOW-SATURATED FAT / LOW-FAT FOOD SUBSTITUTES Meats / Saturated Fat (g)  Avoid: Steak, marbled (3 oz/85 g) / 11 g  Choose: Steak, lean (3 oz/85 g) / 4 g  Avoid: Hamburger (3 oz/85 g) / 7  g  Choose: Hamburger, lean (3 oz/85 g) / 5 g  Avoid: Ham (3 oz/85 g) / 6 g  Choose: Ham, lean cut (3 oz/85 g) / 2.4 g  Avoid: Chicken, with skin, dark meat (3 oz/85 g) / 4 g  Choose: Chicken, skin removed, dark meat (3 oz/85 g) / 2 g  Avoid: Chicken, with skin, light meat (3 oz/85 g) / 2.5 g  Choose: Chicken, skin removed, light meat (3 oz/85 g) / 1 g Dairy / Saturated Fat (g)  Avoid: Whole milk (1 cup) / 5 g  Choose: Low-fat milk, 2% (1 cup) / 3 g  Choose: Low-fat milk, 1% (1 cup) / 1.5 g  Choose: Skim milk (1 cup) / 0.3 g  Avoid: Hard cheese (1 oz/28 g) / 6 g  Choose: Skim milk cheese (1 oz/28 g) / 2 to 3 g  Avoid: Cottage cheese, 4% fat (1 cup) / 6.5 g  Choose: Low-fat cottage cheese, 1% fat (1 cup) / 1.5 g  Avoid: Ice cream (1 cup) / 9 g  Choose: Sherbet (1 cup) / 2.5 g  Choose: Nonfat frozen yogurt (1 cup) / 0.3 g  Choose: Frozen fruit bar / trace  Avoid: Whipped cream (1 tbs) / 3.5 g  Choose: Nondairy whipped topping (1 tbs) / 1 g Condiments / Saturated Fat (g)  Avoid: Mayonnaise (1 tbs) / 2 g  Choose: Low-fat mayonnaise (1 tbs) / 1 g  Avoid: Butter (1 tbs) / 7 g  Choose: Extra light margarine (1 tbs) / 1 g  Avoid: Coconut oil (1 tbs) / 11.8 g  Choose: Olive oil (1 tbs) / 1.8 g  Choose: Corn oil (1 tbs) / 1.7 g  Choose: Safflower oil (1 tbs) / 1.2 g  Choose: Sunflower oil (1 tbs) / 1.4 g  Choose: Soybean oil (1 tbs) / 2.4 g  Choose: Canola oil (1 tbs) / 1 g Document Released: 08/17/2005 Document Revised: 12/12/2012 Document Reviewed: 11/15/2013 [employer] Patient Information 2015 [employer]. This information is not intended to replace advice given to you by your health care provider. Make sure you discuss any questions you have with your health care provider.  

## 2014-09-25 NOTE — Progress Notes (Signed)
Pre visit review using our clinic review tool, if applicable. No additional management support is needed unless otherwise documented below in the visit note. 

## 2014-10-01 ENCOUNTER — Other Ambulatory Visit: Payer: Self-pay | Admitting: Family Medicine

## 2014-11-29 ENCOUNTER — Telehealth: Payer: Self-pay | Admitting: Family

## 2014-11-29 NOTE — Telephone Encounter (Signed)
Noted  

## 2014-11-29 NOTE — Telephone Encounter (Signed)
Patient Name: Catherine [name] DOB: 11/19/1970 Initial Comment Caller states vomiting and having back pain Nurse Assessment Nurse: Charna Elizabethrumbull, RN, [name] Date/Time (Eastern Time): 11/29/2014 10:18:45 AM Confirm and document reason for call. If symptomatic, describe symptoms. ---Caller states she developed vomiting last night and right flank two days ago. No fever. Has the patient traveled out of the country within the last 30 days? ---No Does the patient require triage? ---Yes Related visit to physician within the last 2 weeks? ---No Does the PT have any chronic conditions? (i.e. diabetes, asthma, etc.) ---Yes List chronic conditions. ---Bipolar Did the patient indicate they were pregnant? ---No Guidelines Guideline Title Affirmed Question Affirmed Notes Flank Pain [1] SEVERE pain (e.g., excruciating, scale 8-10) AND [2] present > 1 hour Final Disposition User Go to ED Now Charna Elizabethrumbull, RN, [name] Plans to go to [facility].

## 2015-10-24 ENCOUNTER — Encounter (HOSPITAL_COMMUNITY): Payer: Self-pay | Admitting: Emergency Medicine

## 2015-10-24 ENCOUNTER — Emergency Department (HOSPITAL_COMMUNITY): Payer: 59

## 2015-10-24 ENCOUNTER — Emergency Department (HOSPITAL_COMMUNITY)
Admission: EM | Admit: 2015-10-24 | Discharge: 2015-10-24 | Disposition: A | Discharge status: Home / Self Care | Payer: 59 | Attending: Emergency Medicine | Admitting: Emergency Medicine

## 2015-10-24 DIAGNOSIS — Z91040 Latex allergy status: Secondary | ICD-10-CM | POA: Insufficient documentation

## 2015-10-24 DIAGNOSIS — Z880 Allergy status to penicillin: Secondary | ICD-10-CM | POA: Diagnosis not present

## 2015-10-24 DIAGNOSIS — F329 Major depressive disorder, single episode, unspecified: Secondary | ICD-10-CM | POA: Diagnosis not present

## 2015-10-24 DIAGNOSIS — G43909 Migraine, unspecified, not intractable, without status migrainosus: Secondary | ICD-10-CM | POA: Diagnosis not present

## 2015-10-24 DIAGNOSIS — I10 Essential (primary) hypertension: Secondary | ICD-10-CM | POA: Diagnosis not present

## 2015-10-24 DIAGNOSIS — Z7951 Long term (current) use of inhaled steroids: Secondary | ICD-10-CM | POA: Insufficient documentation

## 2015-10-24 DIAGNOSIS — F17210 Nicotine dependence, cigarettes, uncomplicated: Secondary | ICD-10-CM | POA: Insufficient documentation

## 2015-10-24 DIAGNOSIS — Z79899 Other long term (current) drug therapy: Secondary | ICD-10-CM | POA: Insufficient documentation

## 2015-10-24 DIAGNOSIS — R079 Chest pain, unspecified: Secondary | ICD-10-CM | POA: Diagnosis present

## 2015-10-24 DIAGNOSIS — F419 Anxiety disorder, unspecified: Secondary | ICD-10-CM | POA: Insufficient documentation

## 2015-10-24 DIAGNOSIS — Z87448 Personal history of other diseases of urinary system: Secondary | ICD-10-CM | POA: Diagnosis not present

## 2015-10-24 HISTORY — DX: Essential (primary) hypertension: I10

## 2015-10-24 LAB — CBC
HEMATOCRIT: 45.5 % (ref 36.0–46.0)
Hemoglobin: 15.8 g/dL — ABNORMAL HIGH (ref 12.0–15.0)
MCH: 31.9 pg (ref 26.0–34.0)
MCHC: 34.7 g/dL (ref 30.0–36.0)
MCV: 91.7 fL (ref 78.0–100.0)
Platelets: 258 10*3/uL (ref 150–400)
RBC: 4.96 MIL/uL (ref 3.87–5.11)
RDW: 13.7 % (ref 11.5–15.5)
WBC: 8 10*3/uL (ref 4.0–10.5)

## 2015-10-24 LAB — I-STAT TROPONIN, ED: Troponin i, poc: 0 ng/mL (ref 0.00–0.08)

## 2015-10-24 LAB — BASIC METABOLIC PANEL
Anion gap: 9 (ref 5–15)
BUN: 13 mg/dL (ref 6–20)
CHLORIDE: 107 mmol/L (ref 101–111)
CO2: 23 mmol/L (ref 22–32)
Calcium: 9.6 mg/dL (ref 8.9–10.3)
Creatinine, Ser: 0.62 mg/dL (ref 0.44–1.00)
GFR calc Af Amer: >60 mL/min (ref >60)
GFR calc non Af Amer: >60 mL/min (ref >60)
GLUCOSE: 91 mg/dL (ref 65–99)
POTASSIUM: 3.7 mmol/L (ref 3.5–5.1)
SODIUM: 139 mmol/L (ref 135–145)

## 2015-10-24 MED ORDER — LISINOPRIL-HYDROCHLOROTHIAZIDE 20-25 MG PO TABS: 0.5000 | ORAL_TABLET | Freq: Every day | ORAL | Status: DC

## 2015-10-24 NOTE — Discharge Instructions (Signed)
Hypertension  Hypertension, commonly called high blood pressure, is when the force of blood pumping through your arteries is too strong. Your arteries are the blood vessels that carry blood from your heart throughout your body. A blood pressure reading consists of a higher number over a lower number, such as 110/72. The higher number (systolic) is the pressure inside your arteries when your heart pumps. The lower number (diastolic) is the pressure inside your arteries when your heart relaxes. Ideally you want your blood pressure below 120/80.  Hypertension forces your heart to work harder to pump blood. Your arteries may become narrow or stiff. Having untreated or uncontrolled hypertension can cause heart attack, stroke, kidney disease, and other problems.  RISK FACTORS  Some risk factors for high blood pressure are controllable. Others are not.   Risk factors you cannot control include:   · Race. You may be at higher risk if you are African American.  · Age. Risk increases with age.  · Gender. Men are at higher risk than women before age 45 years. After age 65, women are at higher risk than men.  Risk factors you can control include:  · Not getting enough exercise or physical activity.  · Being overweight.  · Getting too much fat, sugar, calories, or salt in your diet.  · Drinking too much alcohol.  SIGNS AND SYMPTOMS  Hypertension does not usually cause signs or symptoms. Extremely high blood pressure (hypertensive crisis) may cause headache, anxiety, shortness of breath, and nosebleed.  DIAGNOSIS  To check if you have hypertension, your health care provider will measure your blood pressure while you are seated, with your arm held at the level of your heart. It should be measured at least twice using the same arm. Certain conditions can cause a difference in blood pressure between your right and left arms. A blood pressure reading that is higher than normal on one occasion does not mean that you need treatment. If  it is not clear whether you have high blood pressure, you may be asked to return on a different day to have your blood pressure checked again. Or, you may be asked to monitor your blood pressure at home for 1 or more weeks.  TREATMENT  Treating high blood pressure includes making lifestyle changes and possibly taking medicine. Living a healthy lifestyle can help lower high blood pressure. You may need to change some of your habits.  Lifestyle changes may include:  · Following the DASH diet. This diet is high in fruits, vegetables, and whole grains. It is low in salt, red meat, and added sugars.  · Keep your sodium intake below 2,300 mg per day.  · Getting at least 30-45 minutes of aerobic exercise at least 4 times per week.  · Losing weight if necessary.  · Not smoking.  · Limiting alcoholic beverages.  · Learning ways to reduce stress.  Your health care provider may prescribe medicine if lifestyle changes are not enough to get your blood pressure under control, and if one of the following is true:  · You are 18-59 years of age and your systolic blood pressure is above 140.  · You are 60 years of age or older, and your systolic blood pressure is above 150.  · Your diastolic blood pressure is above 90.  · You have diabetes, and your systolic blood pressure is over 140 or your diastolic blood pressure is over 90.  · You have kidney disease and your blood pressure is above   140/90.  · You have heart disease and your blood pressure is above 140/90.  Your personal target blood pressure may vary depending on your medical conditions, your age, and other factors.  HOME CARE INSTRUCTIONS  · Have your blood pressure rechecked as directed by your health care provider.    · Take medicines only as directed by your health care provider. Follow the directions carefully. Blood pressure medicines must be taken as prescribed. The medicine does not work as well when you skip doses. Skipping doses also puts you at risk for  problems.  · Do not smoke.    · Monitor your blood pressure at home as directed by your health care provider.   SEEK MEDICAL CARE IF:   · You think you are having a reaction to medicines taken.  · You have recurrent headaches or feel dizzy.  · You have swelling in your ankles.  · You have trouble with your vision.  SEEK IMMEDIATE MEDICAL CARE IF:  · You develop a severe headache or confusion.  · You have unusual weakness, numbness, or feel faint.  · You have severe chest or abdominal pain.  · You vomit repeatedly.  · You have trouble breathing.  MAKE SURE YOU:   · Understand these instructions.  · Will watch your condition.  · Will get help right away if you are not doing well or get worse.     This information is not intended to replace advice given to you by your health care provider. Make sure you discuss any questions you have with your health care provider.     Document Released: 08/17/2005 Document Revised: 01/01/2015 Document Reviewed: 06/09/2013  Elsevier Interactive Patient Education ©2016 Elsevier Inc.  DASH Eating Plan  DASH stands for "Dietary Approaches to Stop Hypertension." The DASH eating plan is a healthy eating plan that has been shown to reduce high blood pressure (hypertension). Additional health benefits may include reducing the risk of type 2 diabetes mellitus, heart disease, and stroke. The DASH eating plan may also help with weight loss.  WHAT DO I NEED TO KNOW ABOUT THE DASH EATING PLAN?  For the DASH eating plan, you will follow these general guidelines:  · Choose foods with a percent daily value for sodium of less than 5% (as listed on the food label).  · Use salt-free seasonings or herbs instead of table salt or sea salt.  · Check with your health care provider or pharmacist before using salt substitutes.  · Eat lower-sodium products, often labeled as "lower sodium" or "no salt added."  · Eat fresh foods.  · Eat more vegetables, fruits, and low-fat dairy products.  · Choose whole grains.  Look for the word "whole" as the first word in the ingredient list.  · Choose fish and skinless chicken or turkey more often than red meat. Limit fish, poultry, and meat to 6 oz (170 g) each day.  · Limit sweets, desserts, sugars, and sugary drinks.  · Choose heart-healthy fats.  · Limit cheese to 1 oz (28 g) per day.  · Eat more home-cooked food and less restaurant, buffet, and fast food.  · Limit fried foods.  · Cook foods using methods other than frying.  · Limit canned vegetables. If you do use them, rinse them well to decrease the sodium.  · When eating at a restaurant, ask that your food be prepared with less salt, or no salt if possible.  WHAT FOODS CAN I EAT?  Seek help from a dietitian for   individual calorie needs.  Grains  Whole grain or whole wheat bread. Brown rice. Whole grain or whole wheat pasta. Quinoa, bulgur, and whole grain cereals. Low-sodium cereals. Corn or whole wheat flour tortillas. Whole grain cornbread. Whole grain crackers. Low-sodium crackers.  Vegetables  Fresh or frozen vegetables (raw, steamed, roasted, or grilled). Low-sodium or reduced-sodium tomato and vegetable juices. Low-sodium or reduced-sodium tomato sauce and paste. Low-sodium or reduced-sodium canned vegetables.   Fruits  All fresh, canned (in natural juice), or frozen fruits.  Meat and Other Protein Products  Ground beef (85% or leaner), grass-fed beef, or beef trimmed of fat. Skinless chicken or turkey. Ground chicken or turkey. Pork trimmed of fat. All fish and seafood. Eggs. Dried beans, peas, or lentils. Unsalted nuts and seeds. Unsalted canned beans.  Dairy  Low-fat dairy products, such as skim or 1% milk, 2% or reduced-fat cheeses, low-fat ricotta or cottage cheese, or plain low-fat yogurt. Low-sodium or reduced-sodium cheeses.  Fats and Oils  Tub margarines without trans fats. Light or reduced-fat mayonnaise and salad dressings (reduced sodium). Avocado. Safflower, olive, or canola oils. Natural peanut or almond  butter.  Other  Unsalted popcorn and pretzels.  The items listed above may not be a complete list of recommended foods or beverages. Contact your dietitian for more options.  WHAT FOODS ARE NOT RECOMMENDED?  Grains  White bread. White pasta. White rice. Refined cornbread. Bagels and croissants. Crackers that contain trans fat.  Vegetables  Creamed or fried vegetables. Vegetables in a cheese sauce. Regular canned vegetables. Regular canned tomato sauce and paste. Regular tomato and vegetable juices.  Fruits  Dried fruits. Canned fruit in light or heavy syrup. Fruit juice.  Meat and Other Protein Products  Fatty cuts of meat. Ribs, chicken wings, bacon, sausage, bologna, salami, chitterlings, fatback, hot dogs, bratwurst, and packaged luncheon meats. Salted nuts and seeds. Canned beans with salt.  Dairy  Whole or 2% milk, cream, half-and-half, and cream cheese. Whole-fat or sweetened yogurt. Full-fat cheeses or blue cheese. Nondairy creamers and whipped toppings. Processed cheese, cheese spreads, or cheese curds.  Condiments  Onion and garlic salt, seasoned salt, table salt, and sea salt. Canned and packaged gravies. Worcestershire sauce. Tartar sauce. Barbecue sauce. Teriyaki sauce. Soy sauce, including reduced sodium. Steak sauce. Fish sauce. Oyster sauce. Cocktail sauce. Horseradish. Ketchup and mustard. Meat flavorings and tenderizers. Bouillon cubes. Hot sauce. Tabasco sauce. Marinades. Taco seasonings. Relishes.  Fats and Oils  Butter, stick margarine, lard, shortening, ghee, and bacon fat. Coconut, palm kernel, or palm oils. Regular salad dressings.  Other  Pickles and olives. Salted popcorn and pretzels.  The items listed above may not be a complete list of foods and beverages to avoid. Contact your dietitian for more information.  WHERE CAN I FIND MORE INFORMATION?  National Heart, Lung, and Blood Institute: www.nhlbi.nih.gov/health/health-topics/topics/dash/     This information is not intended to replace  advice given to you by your health care provider. Make sure you discuss any questions you have with your health care provider.     Document Released: 08/06/2011 Document Revised: 09/07/2014 Document Reviewed: 06/21/2013  Elsevier Interactive Patient Education ©2016 Elsevier Inc.    How to Take Your Blood Pressure  HOW DO I GET A BLOOD PRESSURE MACHINE?  · You can buy an electronic home blood pressure machine at your local pharmacy. Insurance will sometimes cover the cost if you have a prescription.  · Ask your doctor what type of machine is best for you. There are different machines   for your arm and your wrist.  · If you decide to buy a machine to check your blood pressure on your arm, first check the size of your arm so you can buy the right size cuff. To check the size of your arm:      Use a measuring tape that shows both inches and centimeters.      Wrap the measuring tape around the upper-middle part of your arm. You may need someone to help you measure.      Write down your arm measurement in both inches and centimeters.    · To measure your blood pressure correctly, it is important to have the right size cuff.      If your arm is up to 13 inches (up to 34 centimeters), get an adult cuff size.    If your arm is 13 to 17 inches (35 to 44 centimeters), get a large adult cuff size.       If your arm is 17 to 20 inches (45 to 52 centimeters), get an adult thigh cuff.    WHAT DO THE NUMBERS MEAN?   · There are two numbers that make up your blood pressure. For example: 120/80.    The first number (120 in our example) is called the "systolic pressure." It is a measure of the pressure in your blood vessels when your heart is pumping blood.    The second number (80 in our example) is called the "diastolic pressure." It is a measure of the pressure in your blood vessels when your heart is resting between beats.  · Your doctor will tell you what your blood pressure should be.  WHAT SHOULD I DO BEFORE I CHECK MY BLOOD  PRESSURE?   · Try to rest or relax for at least 30 minutes before you check your blood pressure.  · Do not smoke.  · Do not have any drinks with caffeine, such as:    Soda.    Coffee.    Tea.  · Check your blood pressure in a quiet room.  · Sit down and stretch out your arm on a table. Keep your arm at about the level of your heart. Let your arm relax.  · Make sure that your legs are not crossed.  HOW DO I CHECK MY BLOOD PRESSURE?  · Follow the directions that came with your machine.  · Make sure you remove any tight-fitting clothing from your arm or wrist. Wrap the cuff around your upper arm or wrist. You should be able to fit a finger between the cuff and your arm. If you cannot fit a finger between the cuff and your arm, it is too tight and should be removed and rewrapped.  · Some units require you to manually pump up the arm cuff.  · Automatic units inflate the cuff when you press a button.  · Cuff deflation is automatic in both models.  · After the cuff is inflated, the unit measures your blood pressure and pulse. The readings are shown on a monitor. Hold still and breathe normally while the cuff is inflated.  · Getting a reading takes less than a minute.  · Some models store readings in a memory. Some provide a printout of readings. If your machine does not store your readings, keep a written record.  · Take readings with you to your next visit with your doctor.     This information is not intended to replace advice given to you by your health care provider. Make sure you   discuss any questions you have with your health care provider.     Document Released: 07/30/2008 Document Revised: 09/07/2014 Document Reviewed: 10/12/2013  Elsevier Interactive Patient Education ©2016 Elsevier Inc.

## 2015-10-24 NOTE — ED Notes (Signed)
Pt reports L upper chest pain since yesterday which is constant.  Pt reports it's been intermittent until yesterday.  Pt reports hx of HTN which she was taking lisinopril but her doctor took her off of it a year ago.  Reports went to a health and wellness event at work yesterday and was found to be hypertensive.  Pt also c/o lightheadedness and dizziness for the past 2 days.  Pt is A&Ox 4.

## 2015-10-24 NOTE — ED Notes (Signed)
Per pt, states left sided chest pain for about a month-HTN-was on BP meds but taken off a year ago-states lightheaded, headaches

## 2015-10-24 NOTE — ED Provider Notes (Signed)
CSN: [id number]     Arrival date & time 10/24/15  4098 History   First MD Initiated Contact with Patient 10/24/15 1147     Chief Complaint  Patient presents with  . Chest Pain     (Consider location/radiation/quality/duration/timing/severity/associated sxs/prior Treatment) HPI   Catherine [name] is a 45 y.o. female presents for evaluation of sharp chest pain, present on and off for one month, and worse this morning, longer duration than usual. No change with deep breathing, movement, standing or lying supine. She noticed yesterday that her blood pressure was up when checked, at work. She reports having 2 episodes of "migraine headache" in the last month. At that time she was having some nausea and ear pain as well. No problem with walking, although she feels a mild sensation of dizziness at this time. She is able to continue her usual work over the last month. She was on lisinopril in the past, with hydrochlorothiazide, but was taken off of it for unknown reasons. There are no other known modifying factors.   Past Medical History  Diagnosis Date  . Anxiety   . Depression   . Migraine   . Cystitis, interstitial   . Hypertension    Past Surgical History  Procedure Laterality Date  . Abdominal hysterectomy    . Total abdominal hysterectomy w/ bilateral salpingoophorectomy    . Cystoscopy     Family History  Problem Relation Age of Onset  . Sudden death Mother     suicide   Social History  Substance Use Topics  . Smoking status: Current Every Day Smoker -- 1.00 packs/day    Types: Cigarettes  . Smokeless tobacco: Never Used  . Alcohol Use: 0.0 oz/week    0 Standard drinks or equivalent per week     Comment: occ   OB History    No data available     Review of Systems  All other systems reviewed and are negative.     Allergies  Amoxicillin-pot clavulanate; Divalproex sodium; Latex; and Penicillins  Home Medications   Prior to Admission medications   Medication  Sig Start Date End Date Taking? Authorizing Provider  cyclobenzaprine (FLEXERIL) 5 MG tablet Take 1 tablet (5 mg total) by mouth 3 (three) times daily as needed for muscle spasms. 09/25/14   Eulis Foster, FNP  fluticasone (FLONASE) 50 MCG/ACT nasal spray Place 2 sprays into both nostrils daily. 01/01/14   Terressa Koyanagi, DO  lamoTRIgine (LAMICTAL) 200 MG tablet Take 1 tablet (200 mg total) by mouth daily. 07/04/14   Nelwyn Salisbury, MD  lisinopril-hydrochlorothiazide (PRINZIDE,ZESTORETIC) 20-25 MG tablet Take 0.5 tablets by mouth daily. 10/24/15   Mancel Bale, MD  prazosin (MINIPRESS) 2 MG capsule Take 6 mg by mouth at bedtime.  09/11/14   Historical Provider, MD   BP 147/104 mmHg  Pulse 94  Resp 16  SpO2 100% Physical Exam  Constitutional: She is oriented to person, place, and time. She appears well-developed and well-nourished. No distress.  HENT:  Head: Normocephalic and atraumatic.  Right Ear: External ear normal.  Left Ear: External ear normal.  Eyes: Conjunctivae and EOM are normal. Pupils are equal, round, and reactive to light.  Neck: Normal range of motion and phonation normal. Neck supple.  Cardiovascular: Normal rate, regular rhythm and normal heart sounds.   Pulmonary/Chest: Effort normal and breath sounds normal. She exhibits tenderness (Mild upper chest wall tenderness, bilaterally. No associated crepitation.). She exhibits no bony tenderness.  Abdominal: Soft. There is no  tenderness.  Musculoskeletal: Normal range of motion.  Neurological: She is alert and oriented to person, place, and time. No cranial nerve deficit or sensory deficit. She exhibits normal muscle tone. Coordination normal.  Skin: Skin is warm, dry and intact.  Psychiatric: She has a normal mood and affect. Her behavior is normal. Judgment and thought content normal.  Nursing note and vitals reviewed.   ED Course  Procedures (including critical care time)  Medications - No data to display  Patient Vitals for  the past 24 hrs:  BP Temp src Pulse Resp SpO2  10/24/15 1158 (!) 147/104 mmHg - 94 16 100 %  10/24/15 0938 (!) 153/108 mmHg Oral 94 18 100 %    12:12 PM Reevaluation with update and discussion. After initial assessment and treatment, an updated evaluation reveals , at discharge, she is comfortable without additional complaints. Findings patient and friend, all questions were answered. Jecenia Leamer L    Labs Review Labs Reviewed  CBC - Abnormal; Notable for the following:    Hemoglobin 15.8 (*)    All other components within normal limits  BASIC METABOLIC PANEL  I-STAT TROPOININ, ED    Imaging Review Dg Chest 2 View  10/24/2015  CLINICAL DATA:  Intermittent chest pain EXAM: CHEST  2 VIEW COMPARISON:  11/09/2011 FINDINGS: Normal heart size and mediastinal contours. No acute infiltrate or edema. No effusion or pneumothorax. No acute osseous findings. IMPRESSION: Negative chest. Electronically Signed   By: Marnee Spring M.D.   On: 10/24/2015 10:52   I have personally reviewed and evaluated these images and lab results as part of my medical decision-making.   EKG Interpretation   Date/Time:  Thursday October 24 2015 09:37:42 EST Ventricular Rate:  88 PR Interval:  151 QRS Duration: 93 QT Interval:  347 QTC Calculation: 420 R Axis:   44 Text Interpretation:  Sinus rhythm Borderline T abnormalities, anterior  leads Since last tracing rate slower Confirmed by Alen Matheson  MD, Yaakov Saindon  ([id number]) on 10/24/2015 11:48:48 AM      MDM   Final diagnoses:  Essential hypertension    Nonspecific chest pain, with untreated hypertension. Doubt ACS, PE, pneumonia, or metabolic instability. No evidence for hypertensive urgency.  Nursing Notes Reviewed/ Care Coordinated Applicable Imaging Reviewed Interpretation of Laboratory Data incorporated into ED treatment  The patient appears reasonably screened and/or stabilized for discharge and I doubt any other medical condition or other [facility]  requiring further screening, evaluation, or treatment in the ED at this time prior to discharge.  Plan: Home Medications- restart hydrochlorothiazide and lisinopril as previous.; Home Treatments- rest; return here if the recommended treatment, does not improve the symptoms; Recommended follow up- PCP, check appointment, one or 2 weeks       Mancel Bale, MD 10/24/15 1213

## 2015-11-28 ENCOUNTER — Emergency Department (HOSPITAL_COMMUNITY)
Admission: EM | Admit: 2015-11-28 | Discharge: 2015-11-28 | Disposition: A | Discharge status: Home / Self Care | Payer: 59 | Attending: Emergency Medicine | Admitting: Emergency Medicine

## 2015-11-28 ENCOUNTER — Ambulatory Visit: Payer: 59 | Admitting: Family Medicine

## 2015-11-28 ENCOUNTER — Emergency Department (HOSPITAL_COMMUNITY): Payer: 59

## 2015-11-28 ENCOUNTER — Encounter (HOSPITAL_COMMUNITY): Payer: Self-pay | Admitting: Emergency Medicine

## 2015-11-28 ENCOUNTER — Other Ambulatory Visit: Payer: Self-pay

## 2015-11-28 DIAGNOSIS — R42 Dizziness and giddiness: Secondary | ICD-10-CM | POA: Insufficient documentation

## 2015-11-28 DIAGNOSIS — I10 Essential (primary) hypertension: Secondary | ICD-10-CM | POA: Diagnosis not present

## 2015-11-28 DIAGNOSIS — Z87448 Personal history of other diseases of urinary system: Secondary | ICD-10-CM | POA: Diagnosis not present

## 2015-11-28 DIAGNOSIS — Z79899 Other long term (current) drug therapy: Secondary | ICD-10-CM | POA: Insufficient documentation

## 2015-11-28 DIAGNOSIS — F17210 Nicotine dependence, cigarettes, uncomplicated: Secondary | ICD-10-CM | POA: Diagnosis not present

## 2015-11-28 DIAGNOSIS — Z880 Allergy status to penicillin: Secondary | ICD-10-CM | POA: Insufficient documentation

## 2015-11-28 DIAGNOSIS — F419 Anxiety disorder, unspecified: Secondary | ICD-10-CM | POA: Insufficient documentation

## 2015-11-28 DIAGNOSIS — Z91040 Latex allergy status: Secondary | ICD-10-CM | POA: Insufficient documentation

## 2015-11-28 DIAGNOSIS — R55 Syncope and collapse: Secondary | ICD-10-CM | POA: Insufficient documentation

## 2015-11-28 DIAGNOSIS — F329 Major depressive disorder, single episode, unspecified: Secondary | ICD-10-CM | POA: Insufficient documentation

## 2015-11-28 LAB — URINALYSIS, ROUTINE W REFLEX MICROSCOPIC
Bilirubin Urine: NEGATIVE
GLUCOSE, UA: NEGATIVE mg/dL
HGB URINE DIPSTICK: NEGATIVE
Ketones, ur: NEGATIVE mg/dL
LEUKOCYTES UA: NEGATIVE
Nitrite: NEGATIVE
PROTEIN: NEGATIVE mg/dL
SPECIFIC GRAVITY, URINE: 1.006 (ref 1.005–1.030)
pH: 7.5 (ref 5.0–8.0)

## 2015-11-28 LAB — BASIC METABOLIC PANEL
ANION GAP: 12 (ref 5–15)
BUN: 9 mg/dL (ref 6–20)
CHLORIDE: 103 mmol/L (ref 101–111)
CO2: 23 mmol/L (ref 22–32)
Calcium: 9.6 mg/dL (ref 8.9–10.3)
Creatinine, Ser: 0.55 mg/dL (ref 0.44–1.00)
GFR calc non Af Amer: >60 mL/min (ref >60)
Glucose, Bld: 97 mg/dL (ref 65–99)
POTASSIUM: 3.5 mmol/L (ref 3.5–5.1)
SODIUM: 138 mmol/L (ref 135–145)

## 2015-11-28 LAB — CBC
HCT: 41.4 % (ref 36.0–46.0)
HEMOGLOBIN: 15 g/dL (ref 12.0–15.0)
MCH: 32.3 pg (ref 26.0–34.0)
MCHC: 36.2 g/dL — ABNORMAL HIGH (ref 30.0–36.0)
MCV: 89 fL (ref 78.0–100.0)
PLATELETS: 307 10*3/uL (ref 150–400)
RBC: 4.65 MIL/uL (ref 3.87–5.11)
RDW: 13.3 % (ref 11.5–15.5)
WBC: 9.5 10*3/uL (ref 4.0–10.5)

## 2015-11-28 LAB — CBG MONITORING, ED: GLUCOSE-CAPILLARY: 98 mg/dL (ref 65–99)

## 2015-11-28 NOTE — ED Provider Notes (Signed)
CSN: [id number]     Arrival date & time 11/28/15  1120 History   First MD Initiated Contact with Patient 11/28/15 1147     Chief Complaint  Patient presents with  . Dizziness  . Near Syncope     (Consider location/radiation/quality/duration/timing/severity/associated sxs/prior Treatment) HPI Catherine [name] is a 45 y.o. female with history of anxiety, depression, hypertension, comes in for evaluation of dizziness, lightheadedness and near syncope. Patient reports she was recently started on a new blood pressure 1 month ago, HCTZ lisinopril. She reports since that time she has not felt well and symptoms have persisted until today. Reports that she has felt lightheaded and nauseous over the past 2 days, woke up this morning went to work at approximately 10:30 AM began feeling dizzy, lightheaded, tingling in bilateral arms and mouth. She is a one pack per day smoker. He also reports she is getting over flu like symptoms 2 weeks ago. Denies any chest pain, shortness of breath, diaphoresis or emesis. No other focal numbness or weakness. No vision changes, fevers or chills. No other alleviating or aggravating factors.  Past Medical History  Diagnosis Date  . Anxiety   . Depression   . Migraine   . Cystitis, interstitial   . Hypertension    Past Surgical History  Procedure Laterality Date  . Abdominal hysterectomy    . Total abdominal hysterectomy w/ bilateral salpingoophorectomy    . Cystoscopy     Family History  Problem Relation Age of Onset  . Sudden death Mother     suicide   Social History  Substance Use Topics  . Smoking status: Current Every Day Smoker -- 1.00 packs/day    Types: Cigarettes  . Smokeless tobacco: Never Used  . Alcohol Use: 0.0 oz/week    0 Standard drinks or equivalent per week     Comment: occ   OB History    No data available     Review of Systems A 10 point review of systems was completed and was negative except for pertinent positives and  negatives as mentioned in the history of present illness     Allergies  Amoxicillin-pot clavulanate; Divalproex sodium; Latex; and Penicillins  Home Medications   Prior to Admission medications   Medication Sig Start Date End Date Taking? Authorizing Provider  lisinopril-hydrochlorothiazide (PRINZIDE,ZESTORETIC) 20-25 MG tablet Take 0.5 tablets by mouth daily. 10/24/15  Yes Mancel Bale, MD  chlorpheniramine-HYDROcodone (TUSSIONEX) 10-8 MG/5ML SUER as directed. 11/16/15   Historical Provider, MD  cyclobenzaprine (FLEXERIL) 5 MG tablet Take 1 tablet (5 mg total) by mouth 3 (three) times daily as needed for muscle spasms. Patient not taking: Reported on 11/28/2015 09/25/14   Eulis Foster, FNP  fluticasone [facility]) 50 MCG/ACT nasal spray Place 2 sprays into both nostrils daily. Patient not taking: Reported on 11/28/2015 01/01/14   Terressa Koyanagi, DO  lamoTRIgine (LAMICTAL) 200 MG tablet Take 1 tablet (200 mg total) by mouth daily. Patient not taking: Reported on 11/28/2015 07/04/14   Nelwyn Salisbury, MD  oseltamivir (TAMIFLU) 75 MG capsule Take 75 mg by mouth 2 (two) times daily. For 5 days, starting 11/16/15. 11/16/15   Historical Provider, MD   BP 128/96 mmHg  Pulse 91  Temp(Src) 97.6 F (36.4 C) (Oral)  Resp 16  SpO2 100% Physical Exam  Constitutional: She is oriented to person, place, and time. She appears well-developed and well-nourished.  HENT:  Head: Normocephalic and atraumatic.  Mouth/Throat: Oropharynx is clear and moist.  Eyes: Conjunctivae are  normal. Pupils are equal, round, and reactive to light. Right eye exhibits no discharge. Left eye exhibits no discharge. No scleral icterus.  Neck: Neck supple.  Cardiovascular: Normal rate, regular rhythm and normal heart sounds.   Pulmonary/Chest: Effort normal and breath sounds normal. No respiratory distress. She has no wheezes. She has no rales.  Abdominal: Soft. There is no tenderness.  Musculoskeletal: She exhibits no tenderness.   Neurological: She is alert and oriented to person, place, and time.  Cranial Nerves II-XII grossly intact. Motor strength 5/5 in all 4 extremities. Sensation is intact to light touch. Moves all extremities without ataxia. No pronator drift. Completes finger to nose coordination movements without difficulty. Gait baseline.  Skin: Skin is warm and dry. No rash noted.  Psychiatric: She has a normal mood and affect.  Nursing note and vitals reviewed.   ED Course  Procedures (including critical care time) Labs Review Labs Reviewed  CBC - Abnormal; Notable for the following:    MCHC 36.2 (*)    All other components within normal limits  BASIC METABOLIC PANEL  URINALYSIS, ROUTINE W REFLEX MICROSCOPIC (NOT [facility])  CBG MONITORING, ED    Imaging Review Dg Chest 2 View  11/28/2015  CLINICAL DATA:  Near syncopal episode EXAM: CHEST  2 VIEW COMPARISON:  10/24/2015 FINDINGS: The heart size and mediastinal contours are within normal limits. Both lungs are clear. The visualized skeletal structures are unremarkable. IMPRESSION: No active cardiopulmonary disease. Electronically Signed   By: Alcide CleverMark  Lukens M.D.   On: 11/28/2015 12:50   I have personally reviewed and evaluated these images and lab results as part of my medical decision-making.   EKG Interpretation None     Filed Vitals:   11/28/15 1426 11/28/15 1439 11/28/15 1440 11/28/15 1442  BP: 142/100 134/91 132/109 128/96  Pulse: 66 63  91  Temp:      TempSrc:      Resp: 16     SpO2: 100%       MDM  Catherine [name] is a 45 y.o. female is a one pack per day smoker, comes in for evaluation of intermittent dizziness and nausea with bilateral arm and circumoral tingling. States just recently started a new blood pressure medication one month ago and has been feeling these symptoms intermittently over the past while. On arrival she is hemodynamically stable and afebrile. Patient without any objective findings on physical exam. Labs, EKG and  chest x-ray are all reassuring. Not orthostatic. PERC negative. Patient overall appears well, nontoxic and appropriate for outpatient follow-up. Discussed return precautions. She verbalizes understanding and agrees to this plan as well as subsequent discharge.Prior to patient discharge, I discussed and reviewed this case with Dr.Allen  Final diagnoses:  Dizziness        Joycie PeekBenjamin Shannel Zahm, PA-C 11/28/15 1557  Lorre NickAnthony Allen, MD 12/02/15 [phone]

## 2015-11-28 NOTE — Discharge Instructions (Signed)
There does not appear to be an emergent cause for your symptoms at this time. Your exam and evaluation the ED today was very reassuring. It is important for you to stop smoking. Follow-up with your doctor in the next 2-3 days for reevaluation. Return to ED for any new or worsening symptoms  Dizziness Dizziness is a common problem. It is a feeling of unsteadiness or light-headedness. You may feel like you are about to faint. Dizziness can lead to injury if you stumble or fall. Anyone can become dizzy, but dizziness is more common in older adults. This condition can be caused by a number of things, including medicines, dehydration, or illness. HOME CARE INSTRUCTIONS Taking these steps may help with your condition: Eating and Drinking  Drink enough fluid to keep your urine clear or pale yellow. This helps to keep you from becoming dehydrated. Try to drink more clear fluids, such as water.  Do not drink alcohol.  Limit your caffeine intake if directed by your health care provider.  Limit your salt intake if directed by your health care provider. Activity  Avoid making quick movements.  Rise slowly from chairs and steady yourself until you feel okay.  In the morning, first sit up on the side of the bed. When you feel okay, stand slowly while you hold onto something until you know that your balance is fine.  Move your legs often if you need to stand in one place for a long time. Tighten and relax your muscles in your legs while you are standing.  Do not drive or operate heavy machinery if you feel dizzy.  Avoid bending down if you feel dizzy. Place items in your home so that they are easy for you to reach without leaning over. Lifestyle  Do not use any tobacco products, including cigarettes, chewing tobacco, or electronic cigarettes. If you need help quitting, ask your health care provider.  Try to reduce your stress level, such as with yoga or meditation. Talk with your health care  provider if you need help. General Instructions  Watch your dizziness for any changes.  Take medicines only as directed by your health care provider. Talk with your health care provider if you think that your dizziness is caused by a medicine that you are taking.  Tell a friend or a family member that you are feeling dizzy. If he or she notices any changes in your behavior, have this person call your health care provider.  Keep all follow-up visits as directed by your health care provider. This is important. SEEK MEDICAL CARE IF:  Your dizziness does not go away.  Your dizziness or light-headedness gets worse.  You feel nauseous.  You have reduced hearing.  You have new symptoms.  You are unsteady on your feet or you feel like the room is spinning. SEEK IMMEDIATE MEDICAL CARE IF:  You vomit or have diarrhea and are unable to eat or drink anything.  You have problems talking, walking, swallowing, or using your arms, hands, or legs.  You feel generally weak.  You are not thinking clearly or you have trouble forming sentences. It may take a friend or family member to notice this.  You have chest pain, abdominal pain, shortness of breath, or sweating.  Your vision changes.  You notice any bleeding.  You have a headache.  You have neck pain or a stiff neck.  You have a fever.   This information is not intended to replace advice given to you by  your health care provider. Make sure you discuss any questions you have with your health care provider.   Document Released: 02/10/2001 Document Revised: 01/01/2015 Document Reviewed: 08/13/2014 Elsevier Interactive Patient Education [employer].

## 2015-11-28 NOTE — ED Notes (Signed)
Pt reports dizziness and near syncope , recent Dx HTN , been on BP med x 1 months. Had flu 2 weeks ago. Also reports nausea. denies chest pain . Also reports tingling to hands and face.

## 2015-11-29 ENCOUNTER — Encounter: Payer: Self-pay | Admitting: Adult Health

## 2015-11-29 ENCOUNTER — Ambulatory Visit (INDEPENDENT_AMBULATORY_CARE_PROVIDER_SITE_OTHER): Payer: 59 | Admitting: Adult Health

## 2015-11-29 VITALS — BP 124/90 | Temp 98.1°F | Wt 225.0 lb

## 2015-11-29 DIAGNOSIS — I10 Essential (primary) hypertension: Secondary | ICD-10-CM | POA: Diagnosis not present

## 2015-11-29 DIAGNOSIS — F17200 Nicotine dependence, unspecified, uncomplicated: Secondary | ICD-10-CM

## 2015-11-29 MED ORDER — LISINOPRIL 20 MG PO TABS: 20.0000 mg | ORAL_TABLET | Freq: Every day | ORAL | Status: DC

## 2015-11-29 MED ORDER — BUPROPION HCL ER (SR) 150 MG PO TB12: 150.0000 mg | ORAL_TABLET | Freq: Two times a day (BID) | ORAL | Status: DC

## 2015-11-29 NOTE — Progress Notes (Signed)
Subjective:    Patient ID: Catherine CheadleKimberly D Delpino, female    DOB: [birth year], 45 y.o.   MRN: [id number]  HPI   45 year old female, patient of Worthy Rancheradonda Webb. Since the office today for issues with blood pressure, nausea, lightheadedness, and shakiness.   10/24/2015 she went to the emergency room to be evaluated for sharp chest pain that was present on and off for one month. In the morning she went to the emergency room the chest pain was worse and lasted for longer duration than in the past.She noticed the day prior that her blood pressure was up when checked, at work. She reports having 2 episodes of "migraine headache" in the last month. At that time she was having some nausea and ear pain as well.She was on lisinopril in the past, with hydrochlorothiazide, but was taken off of it for unknown reasons. Her blood pressure during this emergency room visit was elevated at 153/108. She was restarted on lisinopril/hydrochlorothiazide 20-25 milligrams. She reports that for about 3 or 4 days the first week while restarting her blood pressure medication she felt nauseous but that went away and on 11/11/2015 she went to urgent care was diagnosed with influenza at that time she was started on Tamiflu for 5 days, she reports finishing her dose of Tamiflu but continued to feel nauseous and lightheaded.  Again yesterday she came into the emergency room to be evaluated for dizziness, lightheadedness, and near syncopal episode. Reports that she has felt lightheaded and nauseous two days prior to this ER visit. Woke up and went to work, at approximately 10:30 AM began feeling dizzy, lightheaded, tingling in bilateral arms and mouth. She is a one pack per day smoker.  Denies any chest pain, shortness of breath, diaphoresis or emesis.   Labs, chest x-ray, and EKG were all unremarkable during her last ER visit.   Today in the office she continues to complain of nausea, feeling shaky, and lightheadedness. She continues to  take the hydrochlorothiazide/lisinopril. She does report eating and drinking normally  Additionally she is a one pack-a-day smoker, but endorses today that she is ready to quit. He does have a history of bipolar and does not want to try Chantix. She has tried the patch but causes her to break out,she is also tried the gum which causes her jaw pain.  Review of Systems  Constitutional: Positive for activity change and fatigue. Negative for fever, chills, diaphoresis and unexpected weight change.  Respiratory: Negative.   Cardiovascular: Negative.   Gastrointestinal: Positive for nausea. Negative for vomiting, abdominal pain, diarrhea and abdominal distention.  Neurological: Positive for dizziness, light-headedness and headaches. Negative for seizures, syncope, speech difficulty and weakness.  Psychiatric/Behavioral: Negative.   All other systems reviewed and are negative.  Past Medical History  Diagnosis Date  . Anxiety   . Depression   . Migraine   . Cystitis, interstitial   . Hypertension      BP 124/90 mmHg  Temp(Src) 98.1 F (36.7 C) (Oral)  Wt 225 lb (102.059 kg)       Objective:   Physical Exam  Constitutional: She is oriented to person, place, and time. She appears well-developed and well-nourished. No distress.  Smells of cigarette smoke  HENT:  Head: Normocephalic and atraumatic.  Right Ear: External ear normal.  Left Ear: External ear normal.  Nose: Nose normal.  Mouth/Throat: Oropharynx is clear and moist. No oropharyngeal exudate.  Neck: Normal range of motion. Neck supple.  Cardiovascular: Normal rate, regular rhythm, normal  heart sounds and intact distal pulses.  Exam reveals no gallop and no friction rub.   No murmur heard. Pulmonary/Chest: Effort normal and breath sounds normal. No respiratory distress. She has no wheezes. She has no rales. She exhibits no tenderness.  Abdominal: Soft. Bowel sounds are normal. She exhibits no distension and no mass. There is  no tenderness. There is no rebound and no guarding.  Obese around abdomen  Lymphadenopathy:    She has no cervical adenopathy.  Neurological: She is alert and oriented to person, place, and time.  Skin: Skin is warm and dry. No rash noted. She is not diaphoretic. No erythema. No pallor.  Psychiatric: She has a normal mood and affect. Her behavior is normal. Judgment and thought content normal.  Nursing note and vitals reviewed.      Assessment & Plan:  1. Essential hypertension - Can have her stop the lisinopril hydrochlorothiazide pill to see if the hydrochlorothiazide as well as causing her to have feeling of nausea and lightheadedness. There is the possibility that she is still getting over the flu - lisinopril (PRINIVIL,ZESTRIL) 20 MG tablet; Take 1 tablet (20 mg total) by mouth daily.  Dispense: 90 tablet; Refill: 1 -Monitor her pressure at home twice a day bring log to next appointment -Low up in one week or sooner if needed 2. Tobacco use disorder - Spent approximately minutes discussing smoking cessation options with this patient. She is going to quit smoking in 3 days. - buPROPion (WELLBUTRIN SR) 150 MG 12 hr tablet; Take 1 tablet (150 mg total) by mouth 2 (two) times daily.  Dispense: 60 tablet; Refill: 3 - 1 pill for the first 3 days, thereafter can take 1 pill in the morning 1 pill at night - Follow up in one month - We'll see how her smoking cessation is going when she follows up in one week for hypertension  Shirline Frees, NP

## 2015-11-29 NOTE — Patient Instructions (Addendum)
It was great meeting you today!  I think the nausea is coming from the hydrochlorothiazide in your blood pressure medication. I would like you to stop the lisinopril- hydrochlorothiazide tab and start the 20 mg of lisinopril that I sent her pharmacy.  I have also sent a prescription for Wellbutrin, for the first 3 days only take 1 tablet thereafter take 1 tablet in the morning and 1 tablet at night.   Pick a quit date and stick with it, quitting smoking is hard,  but it will be the best thing you can do for your health.   Follow-up with me in one week for blood pressure check. Establish care with myself or the other providers that are taking new patients    Steps to Quit Smoking  Smoking tobacco can be harmful to your health and can affect almost every organ in your body. Smoking puts you, and those around you, at risk for developing many serious chronic diseases. Quitting smoking is difficult, but it is one of the best things that you can do for your health. It is never too late to quit. WHAT ARE THE BENEFITS OF QUITTING SMOKING? When you quit smoking, you lower your risk of developing serious diseases and conditions, such as:  Lung cancer or lung disease, such as COPD.  Heart disease.  Stroke.  Heart attack.  Infertility.  Osteoporosis and bone fractures. Additionally, symptoms such as coughing, wheezing, and shortness of breath may get better when you quit. You may also find that you get sick less often because your body is stronger at fighting off colds and infections. If you are pregnant, quitting smoking can help to reduce your chances of having a baby of low birth weight. HOW DO I GET READY TO QUIT? When you decide to quit smoking, create a plan to make sure that you are successful. Before you quit:  Pick a date to quit. Set a date within the next two weeks to give you time to prepare.  Write down the reasons why you are quitting. Keep this list in places where you will see  it often, such as on your bathroom mirror or in your car or wallet.  Identify the people, places, things, and activities that make you want to smoke (triggers) and avoid them. Make sure to take these actions:  Throw away all cigarettes at home, at work, and in your car.  Throw away smoking accessories, such as Set designer.  Clean your car and make sure to empty the ashtray.  Clean your home, including curtains and carpets.  Tell your family, friends, and coworkers that you are quitting. Support from your loved ones can make quitting easier.  Talk with your health care provider about your options for quitting smoking.  Find out what treatment options are covered by your health insurance. WHAT STRATEGIES CAN I USE TO QUIT SMOKING?  Talk with your healthcare provider about different strategies to quit smoking. Some strategies include:  Quitting smoking altogether instead of gradually lessening how much you smoke over a period of time. Research shows that quitting "cold [place]" is more successful than gradually quitting.  Attending in-person counseling to help you build problem-solving skills. You are more likely to have success in quitting if you attend several counseling sessions. Even short sessions of 10 minutes can be effective.  Finding resources and support systems that can help you to quit smoking and remain smoke-free after you quit. These resources are most helpful when you use them  often. They can include:  Online chats with a Veterinary surgeoncounselor.  Telephone quitlines.  Printed Materials engineerself-help materials.  Support groups or group counseling.  Text messaging programs.  Mobile phone applications.  Taking medicines to help you quit smoking. (If you are pregnant or breastfeeding, talk with your health care provider first.) Some medicines contain nicotine and some do not. Both types of medicines help with cravings, but the medicines that include nicotine help to relieve withdrawal  symptoms. Your health care provider may recommend:  Nicotine patches, gum, or lozenges.  Nicotine inhalers or sprays.  Non-nicotine medicine that is taken by mouth. Talk with your health care provider about combining strategies, such as taking medicines while you are also receiving in-person counseling. Using these two strategies together makes you more likely to succeed in quitting than if you used either strategy on its own. If you are pregnant or breastfeeding, talk with your health care provider about finding counseling or other support strategies to quit smoking. Do not take medicine to help you quit smoking unless told to do so by your health care provider. WHAT THINGS CAN I DO TO MAKE IT EASIER TO QUIT? Quitting smoking might feel overwhelming at first, but there is a lot that you can do to make it easier. Take these important actions:  Reach out to your family and friends and ask that they support and encourage you during this time. Call telephone quitlines, reach out to support groups, or work with a counselor for support.  Ask people who smoke to avoid smoking around you.  Avoid places that trigger you to smoke, such as bars, parties, or smoke-break areas at work.  Spend time around people who do not smoke.  Lessen stress in your life, because stress can be a smoking trigger for some people. To lessen stress, try:  Exercising regularly.  Deep-breathing exercises.  Yoga.  Meditating.  Performing a body scan. This involves closing your eyes, scanning your body from head to toe, and noticing which parts of your body are particularly tense. Purposefully relax the muscles in those areas.  Download or purchase mobile phone or tablet apps (applications) that can help you stick to your quit plan by providing reminders, tips, and encouragement. There are many free apps, such as QuitGuide from the [employer] developer(Centers for Disease Control and Prevention). You can find other support for  quitting smoking (smoking cessation) through smokefree.gov and other websites. HOW WILL I FEEL WHEN I QUIT SMOKING? Within the first 24 hours of quitting smoking, you may start to feel some withdrawal symptoms. These symptoms are usually most noticeable 2-3 days after quitting, but they usually do not last beyond 2-3 weeks. Changes or symptoms that you might experience include:  Mood swings.  Restlessness, anxiety, or irritation.  Difficulty concentrating.  Dizziness.  Strong cravings for sugary foods in addition to nicotine.  Mild weight gain.  Constipation.  Nausea.  Coughing or a sore throat.  Changes in how your medicines work in your body.  A depressed mood.  Difficulty sleeping (insomnia). After the first 2-3 weeks of quitting, you may start to notice more positive results, such as:  Improved sense of smell and taste.  Decreased coughing and sore throat.  Slower heart rate.  Lower blood pressure.  Clearer skin.  The ability to breathe more easily.  Fewer sick days. Quitting smoking is very challenging for most people. Do not get discouraged if you are not successful the first time. Some people need to make many attempts to  quit before they achieve long-term success. Do your best to stick to your quit plan, and talk with your health care provider if you have any questions or concerns.   This information is not intended to replace advice given to you by your health care provider. Make sure you discuss any questions you have with your health care provider.   Document Released: 08/11/2001 Document Revised: 01/01/2015 Document Reviewed: 01/01/2015 Elsevier Interactive Patient Education [employer]

## 2015-12-06 ENCOUNTER — Ambulatory Visit (INDEPENDENT_AMBULATORY_CARE_PROVIDER_SITE_OTHER): Payer: 59 | Admitting: Adult Health

## 2015-12-06 ENCOUNTER — Encounter: Payer: Self-pay | Admitting: Adult Health

## 2015-12-06 ENCOUNTER — Telehealth: Payer: Self-pay | Admitting: Adult Health

## 2015-12-06 VITALS — BP 140/90 | Temp 98.4°F | Wt 230.0 lb

## 2015-12-06 DIAGNOSIS — F17200 Nicotine dependence, unspecified, uncomplicated: Secondary | ICD-10-CM

## 2015-12-06 DIAGNOSIS — R110 Nausea: Secondary | ICD-10-CM | POA: Diagnosis not present

## 2015-12-06 DIAGNOSIS — I10 Essential (primary) hypertension: Secondary | ICD-10-CM | POA: Diagnosis not present

## 2015-12-06 NOTE — Patient Instructions (Addendum)
It was great seeing you again!  Words cannot describe how proud I am of you!  Continue with increasing exercise, cutting back on smoking, and eating healthy.   I will see you on May 2nd at 7 am. If you need anything in the meantime, please let me know.

## 2015-12-06 NOTE — Progress Notes (Signed)
Pre visit review using our clinic review tool, if applicable. No additional management support is needed unless otherwise documented below in the visit note. 

## 2015-12-06 NOTE — Telephone Encounter (Signed)
Informed patient that I faxed her FMLA paperwork.   FMLA paperwork scanned to file

## 2015-12-06 NOTE — Progress Notes (Signed)
   Subjective:    Patient ID: Catherine [name], female    DOB: 1970-10-04, 45 y.o.   MRN: [id number]  HPI  45 year old female who presents to the office today for one week follow up. The last time I saw her, she was complaining of nausea, lightheadedness and feeling shaky. I had taken her off Hyzaar and started her on Lisinopril 20 mg.   I had also started her on Wellbutrin 300mg  to help with smoking cessation.   Today in the office she reports " I am feeling much better!". Hs is no longer dizzy or lightheaded, she is no longer shaky and she has not had a migraine since starting Lisinopril. She continues to feel a little nauseated.   She has cut back from one pack a day to currently being at one pack a week. She feels as though her cravings are becoming less. She is supplementing with nicotine lozenges.   She is checking her BP at home and her log reports BP between 115-137 systolic.   Review of Systems  Constitutional: Negative.   HENT: Negative.   Respiratory: Negative.   Cardiovascular: Negative.   Gastrointestinal: Positive for nausea. Negative for vomiting and diarrhea.  Musculoskeletal: Negative.   Neurological: Negative.   Psychiatric/Behavioral: Negative.   All other systems reviewed and are negative.   BP 140/90 mmHg  Temp(Src) 98.4 F (36.9 C) (Oral)  Wt 230 lb (104.327 kg)       Objective:   Physical Exam  Constitutional: She is oriented to person, place, and time. She appears well-developed and well-nourished. No distress.  HENT:  Head: Atraumatic.  Cardiovascular: Normal rate, regular rhythm, normal heart sounds and intact distal pulses.  Exam reveals no gallop and no friction rub.   No murmur heard. Pulmonary/Chest: Effort normal and breath sounds normal. No respiratory distress. She has no wheezes. She has no rales. She exhibits no tenderness.  Abdominal: Soft. Bowel sounds are normal. She exhibits no distension and no mass. There is no tenderness. There  is no rebound and no guarding.  Musculoskeletal: Normal range of motion. She exhibits no edema or tenderness.  Neurological: She is alert and oriented to person, place, and time.  Skin: Skin is warm and dry. No rash noted. She is not diaphoretic. No erythema. No pallor.  Psychiatric: She has a normal mood and affect. Her behavior is normal. Judgment and thought content normal.  Vitals reviewed.      Assessment & Plan:  1. Essential hypertension - No change in medication - Starting to become more well controlled - Continue to exercise and eat healthy  - I hope as though she cuts back on smoking and increasing exercise her BP will drop.  2. TOBACCO USE - Continue to cut back  - No change in medication. 3. Nausea without vomiting - Maybe due to Wellbutrin or withdraw symptoms   Shirline Freesory Kaetlyn Noa, NP

## 2015-12-20 ENCOUNTER — Telehealth: Payer: Self-pay | Admitting: Family

## 2015-12-20 NOTE — Telephone Encounter (Signed)
Have you seen form for this patient?

## 2015-12-20 NOTE — Telephone Encounter (Signed)
Yes, I faxed it in earlier in the week

## 2015-12-20 NOTE — Telephone Encounter (Signed)
Pt is calling about a form that was dropped off on Monday 12/16/15 and wanted to know if it was faxed and ready for pick-up.

## 2015-12-31 ENCOUNTER — Ambulatory Visit (INDEPENDENT_AMBULATORY_CARE_PROVIDER_SITE_OTHER): Payer: 59 | Admitting: Adult Health

## 2015-12-31 ENCOUNTER — Encounter: Payer: Self-pay | Admitting: Adult Health

## 2015-12-31 VITALS — BP 136/82 | Temp 97.8°F | Ht 70.0 in | Wt 231.0 lb

## 2015-12-31 DIAGNOSIS — Z7689 Persons encountering health services in other specified circumstances: Secondary | ICD-10-CM

## 2015-12-31 DIAGNOSIS — Z7189 Other specified counseling: Secondary | ICD-10-CM | POA: Diagnosis not present

## 2015-12-31 DIAGNOSIS — I10 Essential (primary) hypertension: Secondary | ICD-10-CM | POA: Diagnosis not present

## 2015-12-31 DIAGNOSIS — F17200 Nicotine dependence, unspecified, uncomplicated: Secondary | ICD-10-CM | POA: Diagnosis not present

## 2015-12-31 MED ORDER — BUPROPION HCL ER (SR) 150 MG PO TB12: 150.0000 mg | ORAL_TABLET | Freq: Two times a day (BID) | ORAL | Status: DC

## 2015-12-31 MED ORDER — LISINOPRIL 20 MG PO TABS: 20.0000 mg | ORAL_TABLET | Freq: Every day | ORAL | Status: DC

## 2015-12-31 NOTE — Progress Notes (Signed)
    Patient presents to clinic today to establish care. She is a pleasant 45 year old caucasian female who  has a past medical history of Anxiety; Depression; Migraine; Cystitis, interstitial; Hypertension; and Bipolar 1 disorder (HCC). Her last physical was " three years ago".    Acute Concerns: Establish Care  Chronic Issues: Essential Hypertension - getting closer to goal. Is not feeling as nauseated as she once used to. She feels as though it is getting better controlled.  Migraine  - Since her blood pressure has gone down her migraines have gone away.   Bipolar/Anxiety - She has not taken Lamictal in a year. She feels better off the medication than she did on. She is no longer seeing psychiatry do to the cost. She is interested in going back.   Tobacco Use - 2 cigs days , she continues to cut back and is using Wellbutrin  Health Maintenance: Dental -- Does not go to the dentist .  Vision --Needs to go - has not been in three years  Immunizations -- Tetanus due but she does not want one.  Colonoscopy -- Not yet needed Mammogram -- 2013  PAP -- No longer needed  Bone Density -- Not yet needed Diet: Eats healthy  Exercise: Has started walking 30 minutes a night. Takes the stares at work.     BP 136/82 mmHg  Temp(Src) 97.8 F (36.6 C) (Oral)  Ht 5\' 10"  (1.778 m)  Wt 231 lb (104.781 kg)  BMI 33.15 kg/m2     Review of Systems  Unable to perform ROS Constitutional: Negative.   HENT: Negative.   Respiratory: Negative.   Cardiovascular: Negative.   Gastrointestinal: Negative.   Genitourinary: Negative.   Neurological: Negative.   Psychiatric/Behavioral: Negative.     BP 136/82 mmHg  Temp(Src) 97.8 F (36.6 C) (Oral)  Ht 5\' 10"  (1.778 m)  Wt 231 lb (104.781 kg)  BMI 33.15 kg/m2  Physical Exam  Constitutional: She is oriented to person, place, and time and well-developed, well-nourished, and in no distress. No distress.  HENT:  Head: Normocephalic and  atraumatic.  Right Ear: External ear normal.  Left Ear: External ear normal.  Nose: Nose normal.  Mouth/Throat: Oropharynx is clear and moist. No oropharyngeal exudate.  Cardiovascular: Normal rate, regular rhythm, normal heart sounds and intact distal pulses.  Exam reveals no gallop and no friction rub.   No murmur heard. Pulmonary/Chest: Effort normal and breath sounds normal. No respiratory distress. She has no wheezes. She has no rales. She exhibits no tenderness.  Neurological: She is alert and oriented to person, place, and time. Gait normal. GCS score is 15.  Skin: Skin is warm and dry. No rash noted. She is not diaphoretic. No erythema. No pallor.  Psychiatric: Mood, memory, affect and judgment normal.  Vitals reviewed.    Assessment/Plan: 1. Essential hypertension - Keep dose the same - lisinopril (PRINIVIL,ZESTRIL) 20 MG tablet; Take 1 tablet (20 mg total) by mouth daily.  Dispense: 90 tablet; Refill: 1  2. Tobacco use disorder - buPROPion (WELLBUTRIN SR) 150 MG 12 hr tablet; Take 1 tablet (150 mg total) by mouth 2 (two) times daily.  Dispense: 60 tablet; Refill: 1  3. Encounter to establish care - Follow up with for CPE - MM DIGITAL SCREENING BILATERAL; Future - Follow up sooner with any acute issues - Continue to increase walking    Shirline Freesory Dellia Donnelly, NP

## 2015-12-31 NOTE — Patient Instructions (Signed)
It was great seeing you again.   Please follow up with me for your physical .  Continue to work on quitting smoking! You are doing an amazing job.   Continue to exercise and eat healthy .   If you need anything, please let me know.

## 2016-01-07 ENCOUNTER — Other Ambulatory Visit: Payer: Self-pay | Admitting: Adult Health

## 2016-01-07 DIAGNOSIS — Z1231 Encounter for screening mammogram for malignant neoplasm of breast: Secondary | ICD-10-CM

## 2016-01-20 ENCOUNTER — Ambulatory Visit
Admission: RE | Admit: 2016-01-20 | Discharge: 2016-01-20 | Disposition: A | Discharge status: Home / Self Care | Payer: 59 | Source: Ambulatory Visit | Attending: Adult Health | Admitting: Adult Health

## 2016-01-20 DIAGNOSIS — Z1231 Encounter for screening mammogram for malignant neoplasm of breast: Secondary | ICD-10-CM

## 2016-03-04 ENCOUNTER — Encounter: Payer: 59 | Admitting: Adult Health

## 2016-04-16 ENCOUNTER — Telehealth: Payer: Self-pay | Admitting: Emergency Medicine

## 2016-04-16 ENCOUNTER — Other Ambulatory Visit: Payer: Self-pay | Admitting: Emergency Medicine

## 2016-04-16 ENCOUNTER — Other Ambulatory Visit: Payer: Self-pay | Admitting: Adult Health

## 2016-04-16 DIAGNOSIS — F17200 Nicotine dependence, unspecified, uncomplicated: Secondary | ICD-10-CM

## 2016-04-16 DIAGNOSIS — I10 Essential (primary) hypertension: Secondary | ICD-10-CM

## 2016-04-16 MED ORDER — BUPROPION HCL ER (SR) 150 MG PO TB12: 150.0000 mg | ORAL_TABLET | Freq: Two times a day (BID) | ORAL | 1 refills | Status: DC

## 2016-04-16 NOTE — Telephone Encounter (Signed)
Medication sent in. 

## 2016-04-16 NOTE — Telephone Encounter (Signed)
refilll request for Bupropion SR tab  Last filled 12/31/15 #60 Refills: 1  Pt last OV 12/31/15   Okay to refill?

## 2016-04-22 ENCOUNTER — Other Ambulatory Visit: Payer: Self-pay | Admitting: *Deleted

## 2016-04-22 DIAGNOSIS — I10 Essential (primary) hypertension: Secondary | ICD-10-CM

## 2016-04-22 MED ORDER — LISINOPRIL 20 MG PO TABS: 20.0000 mg | ORAL_TABLET | Freq: Every day | ORAL | 1 refills | Status: DC

## 2017-05-21 ENCOUNTER — Encounter: Payer: Self-pay | Admitting: Adult Health

## 2018-05-31 ENCOUNTER — Ambulatory Visit: Payer: PRIVATE HEALTH INSURANCE | Admitting: Adult Health

## 2018-05-31 ENCOUNTER — Encounter: Payer: Self-pay | Admitting: Adult Health

## 2018-05-31 VITALS — BP 124/60 | HR 95 | Temp 98.0°F | Wt 243.8 lb

## 2018-05-31 DIAGNOSIS — J069 Acute upper respiratory infection, unspecified: Secondary | ICD-10-CM | POA: Diagnosis not present

## 2018-05-31 MED ORDER — DOXYCYCLINE HYCLATE 100 MG PO CAPS: 100.0000 mg | ORAL_CAPSULE | Freq: Two times a day (BID) | ORAL | 0 refills | Status: DC

## 2018-05-31 MED ORDER — METHYLPREDNISOLONE 4 MG PO TBPK: ORAL_TABLET | ORAL | 0 refills | Status: DC

## 2018-05-31 NOTE — Progress Notes (Signed)
Subjective:    Patient ID: Catherine [name], female    DOB: 1970-09-03, 47 y.o.   MRN: [id number]  URI   This is a new problem. The current episode started 1 to 4 weeks ago (2 weeks ). There has been no fever. Associated symptoms include congestion, coughing (semi productive ), headaches, rhinorrhea and sinus pain. Pertinent negatives include no ear pain, nausea, plugged ear sensation or sore throat. Treatments tried: Mucinex     Review of Systems  HENT: Positive for congestion, rhinorrhea and sinus pain. Negative for ear pain and sore throat.   Respiratory: Positive for cough (semi productive ).   Gastrointestinal: Negative for nausea.  Neurological: Positive for headaches.   See HPI   Past Medical History:  Diagnosis Date  . Anxiety   . Bipolar 1 disorder (HCC)   . Cystitis, interstitial   . Depression   . Hypertension   . Migraine     Social History   Socioeconomic History  . Marital status: Divorced    Spouse name: Not on file  . Number of children: Not on file  . Years of education: Not on file  . Highest education level: Not on file  Occupational History  . Not on file  Social Needs  . Financial resource strain: Not on file  . Food insecurity:    Worry: Not on file    Inability: Not on file  . Transportation needs:    Medical: Not on file    Non-medical: Not on file  Tobacco Use  . Smoking status: Current Every Day Smoker    Packs/day: 0.20    Types: Cigarettes  . Smokeless tobacco: Never Used  Substance and Sexual Activity  . Alcohol use: Yes    Alcohol/week: 0.0 standard drinks    Comment: occ  . Drug use: No  . Sexual activity: Never  Lifestyle  . Physical activity:    Days per week: Not on file    Minutes per session: Not on file  . Stress: Not on file  Relationships  . Social connections:    Talks on phone: Not on file    Gets together: Not on file    Attends religious service: Not on file    Active member of club or organization: Not  on file    Attends meetings of clubs or organizations: Not on file    Relationship status: Not on file  . Intimate partner violence:    Fear of current or ex partner: Not on file    Emotionally abused: Not on file    Physically abused: Not on file    Forced sexual activity: Not on file  Other Topics Concern  . Not on file  Social History Narrative   Working - Works at [employer]   Two kids  - do not live with her. 21 and 24       Past Surgical History:  Procedure Laterality Date  . ABDOMINAL HYSTERECTOMY    . CYSTOSCOPY    . TOTAL ABDOMINAL HYSTERECTOMY W/ BILATERAL SALPINGOOPHORECTOMY      Family History  Problem Relation Age of Onset  . Sudden death Mother        suicide  . Hypothyroidism Sister       Current Outpatient Medications on File Prior to Visit  Medication Sig Dispense Refill  . lisinopril (PRINIVIL,ZESTRIL) 20 MG tablet Take 1 tablet (20 mg total) by mouth daily. 90 tablet 1  . lisinopril-hydrochlorothiazide (PRINZIDE,ZESTORETIC)  10-12.5 MG tablet Take by mouth.     No current facility-administered medications on file prior to visit.     BP 124/60 (BP Location: Left Arm, Patient Position: Sitting, Cuff Size: Normal)   Pulse 95   Temp 98 F (36.7 C) (Oral)   Wt 243 lb 12.8 oz (110.6 kg)   SpO2 99%   BMI 34.98 kg/m       Objective:   Physical Exam  Constitutional: She is oriented to person, place, and time. She appears well-developed and well-nourished. No distress.  HENT:  Head: Normocephalic.  Right Ear: Hearing, tympanic membrane, external ear and ear canal normal.  Left Ear: Hearing, tympanic membrane, external ear and ear canal normal.  Nose: Mucosal edema and rhinorrhea present. Right sinus exhibits maxillary sinus tenderness. Left sinus exhibits maxillary sinus tenderness.  Mouth/Throat: Uvula is midline, oropharynx is clear and moist and mucous membranes are normal.  Cardiovascular: Normal rate, regular rhythm, normal  heart sounds and intact distal pulses.  Pulmonary/Chest: Effort normal. She has wheezes. She has no rhonchi. She has no rales.  Neurological: She is alert and oriented to person, place, and time.  Skin: She is not diaphoretic.  Psychiatric: She has a normal mood and affect. Her behavior is normal. Judgment and thought content normal.  Nursing note and vitals reviewed.     Assessment & Plan:  1. Upper respiratory tract infection, unspecified type - Encouraged to quit smoking  - doxycycline (VIBRAMYCIN) 100 MG capsule; Take 1 capsule (100 mg total) by mouth 2 (two) times daily.  Dispense: 14 capsule; Refill: 0 - methylPREDNISolone (MEDROL DOSEPAK) 4 MG TBPK tablet; Take as directed  Dispense: 21 tablet; Refill: 0 - Follow up in 2-3 days if no improvement   Shirline Frees, NP

## 2018-07-29 ENCOUNTER — Ambulatory Visit: Payer: Self-pay

## 2018-07-29 NOTE — Telephone Encounter (Signed)
Pt c/o nausea and vomiting for 3 weeks. Pt initially thought is was food poisoning and/or but symptoms has persisted. Pt also having diarrhea. Pt has tried a bland and is tolerating water and ginger ale. Pt has also tried Weyer[employer]euser CompanyPepto Bismol and called "teledoc" and was prescribed Zofran. Pt stated that she gets some relief but symptoms have persisted. Pt has vomited 1 time in the past 24 hours. Despite the nausea and vomiting, she stated she has a good appetite.  Pt given care advice and pt verbalized understanding. Pt given appointment for 08/02/18 with Shirline Freesory Nafziger NP. Reason for Disposition . Nausea lasts > 1 week  Answer Assessment - Initial Assessment Questions 1. NAUSEA SEVERITY: "How bad is the nausea?" (e.g., mild, moderate, severe; dehydration, weight loss)   - MILD: loss of appetite without change in eating habits   - MODERATE: decreased oral intake without significant weight loss, dehydration, or malnutrition   - SEVERE: inadequate caloric or fluid intake, significant weight loss, symptoms of dehydration     mild 2. ONSET: "When did the nausea begin?"     November 5th 3. VOMITING: "Any vomiting?" If so, ask: "How many times today?"     Yes- 1 time in the 24 hours 4. RECURRENT SYMPTOM: "Have you had nausea before?" If so, ask: "When was the last time?" "What happened that time?"     Yes- "but nothing like this" -went to doctor but cannot remember details 5. CAUSE: "What do you think is causing the nausea?"     intially food poisoning, stress or anxiety 6. PREGNANCY: "Is there any chance you are pregnant?" (e.g., unprotected intercourse, missed birth control pill, broken condom)     No LMP n/a  Protocols used: NAUSEA-A-AH

## 2018-08-02 ENCOUNTER — Ambulatory Visit: Payer: PRIVATE HEALTH INSURANCE | Admitting: Adult Health

## 2018-08-02 ENCOUNTER — Encounter: Payer: Self-pay | Admitting: Adult Health

## 2018-08-02 VITALS — BP 112/84 | Temp 98.2°F | Wt 246.0 lb

## 2018-08-02 DIAGNOSIS — I10 Essential (primary) hypertension: Secondary | ICD-10-CM | POA: Diagnosis not present

## 2018-08-02 DIAGNOSIS — K279 Peptic ulcer, site unspecified, unspecified as acute or chronic, without hemorrhage or perforation: Secondary | ICD-10-CM

## 2018-08-02 LAB — CBC WITH DIFFERENTIAL/PLATELET
Basophils Absolute: 0 10*3/uL (ref 0.0–0.1)
Basophils Relative: 0.4 % (ref 0.0–3.0)
EOS PCT: 0.8 % (ref 0.0–5.0)
Eosinophils Absolute: 0.1 10*3/uL (ref 0.0–0.7)
HCT: 42.6 % (ref 36.0–46.0)
Hemoglobin: 14.9 g/dL (ref 12.0–15.0)
LYMPHS ABS: 2.6 10*3/uL (ref 0.7–4.0)
Lymphocytes Relative: 35.3 % (ref 12.0–46.0)
MCHC: 34.9 g/dL (ref 30.0–36.0)
MCV: 93.1 fl (ref 78.0–100.0)
MONO ABS: 0.3 10*3/uL (ref 0.1–1.0)
Monocytes Relative: 4.7 % (ref 3.0–12.0)
NEUTROS ABS: 4.3 10*3/uL (ref 1.4–7.7)
NEUTROS PCT: 58.8 % (ref 43.0–77.0)
PLATELETS: 275 10*3/uL (ref 150.0–400.0)
RBC: 4.57 Mil/uL (ref 3.87–5.11)
RDW: 14.2 % (ref 11.5–15.5)
WBC: 7.4 10*3/uL (ref 4.0–10.5)

## 2018-08-02 LAB — H. PYLORI ANTIBODY, IGG: H PYLORI IGG: NEGATIVE

## 2018-08-02 MED ORDER — PANTOPRAZOLE SODIUM 40 MG PO TBEC: 40.0000 mg | DELAYED_RELEASE_TABLET | Freq: Every day | ORAL | 0 refills | Status: DC

## 2018-08-02 MED ORDER — LISINOPRIL-HYDROCHLOROTHIAZIDE 10-12.5 MG PO TABS: 1.0000 | ORAL_TABLET | Freq: Every day | ORAL | 0 refills | Status: DC

## 2018-08-02 NOTE — Progress Notes (Signed)
Subjective:    Patient ID: Catherine [name], female    DOB: [birth year], 47 y.o.   MRN: [id number]  HPI  47 year old female who  has a past medical history of Anxiety, Bipolar 1 disorder (HCC), Cystitis, interstitial, Depression, Hypertension, and Migraine. She presents to the office today for an acute issue of intermittent nausea and vomiting that started around one month ago. She reports that her symptoms are more prevalent after she eats a meal but she feels nauseated all day. She does report a " dull pain" in her abdomen after she eats that lasts 30 min - 1 hour. She last had an episode of vomiting four days ago. She has not noticed any blood in stool or vomit. Denies burning sensation in her throat or waking up with a sour taste in her mouth   She has tried Pepto and Imodium with resolution in her symptoms    Review of Systems See HPI   Past Medical History:  Diagnosis Date  . Anxiety   . Bipolar 1 disorder (HCC)   . Cystitis, interstitial   . Depression   . Hypertension   . Migraine     Social History   Socioeconomic History  . Marital status: Divorced    Spouse name: Not on file  . Number of children: Not on file  . Years of education: Not on file  . Highest education level: Not on file  Occupational History  . Not on file  Social Needs  . Financial resource strain: Not on file  . Food insecurity:    Worry: Not on file    Inability: Not on file  . Transportation needs:    Medical: Not on file    Non-medical: Not on file  Tobacco Use  . Smoking status: Current Every Day Smoker    Packs/day: 0.20    Types: Cigarettes  . Smokeless tobacco: Never Used  Substance and Sexual Activity  . Alcohol use: Yes    Alcohol/week: 0.0 standard drinks    Comment: occ  . Drug use: No  . Sexual activity: Never  Lifestyle  . Physical activity:    Days per week: Not on file    Minutes per session: Not on file  . Stress: Not on file  Relationships  . Social connections:     Talks on phone: Not on file    Gets together: Not on file    Attends religious service: Not on file    Active member of club or organization: Not on file    Attends meetings of clubs or organizations: Not on file    Relationship status: Not on file  . Intimate partner violence:    Fear of current or ex partner: Not on file    Emotionally abused: Not on file    Physically abused: Not on file    Forced sexual activity: Not on file  Other Topics Concern  . Not on file  Social History Narrative   Working - Works at [employer]   Divorced   Two kids  - do not live with her. 21 and 24       Past Surgical History:  Procedure Laterality Date  . ABDOMINAL HYSTERECTOMY    . CYSTOSCOPY    . TOTAL ABDOMINAL HYSTERECTOMY W/ BILATERAL SALPINGOOPHORECTOMY      Family History  Problem Relation Age of Onset  . Sudden death Mother        suicide  . Hypothyroidism Sister  No current outpatient medications on file prior to visit.   No current facility-administered medications on file prior to visit.     BP 112/84   Temp 98.2 F (36.8 C)   Wt 246 lb (111.6 kg)   BMI 35.30 kg/m       Objective:   Physical Exam  Constitutional: She is oriented to person, place, and time. She appears well-developed and well-nourished. No distress.  Cardiovascular: Normal rate, regular rhythm, normal heart sounds and intact distal pulses.  Pulmonary/Chest: Effort normal and breath sounds normal.  Abdominal: Soft. Bowel sounds are normal. She exhibits no distension and no mass. There is tenderness in the epigastric area. There is no rebound and no guarding. No hernia.  Neurological: She is alert and oriented to person, place, and time.  Skin: Capillary refill takes less than 2 seconds. She is not diaphoretic.  Psychiatric: She has a normal mood and affect. Her behavior is normal. Thought content normal.  Nursing note and vitals reviewed.     Assessment & Plan:  1. Peptic ulcer  disease - Likely GERD but concerned for PUD. Will check for h.pylori infection. Pleace on Protonix for 30 days. Follow up if no improvement in the next week. Stay away from acidic and spicy foods. Stop smoking  - H. pylori antibody, IgG - pantoprazole (PROTONIX) 40 MG tablet; Take 1 tablet (40 mg total) by mouth daily.  Dispense: 30 tablet; Refill: 0 - CBC with Differential/Platelet  2. Essential hypertension  - lisinopril-hydrochlorothiazide (PRINZIDE,ZESTORETIC) 10-12.5 MG tablet; Take 1 tablet by mouth daily.  Dispense: 90 tablet; Refill: 0  Shirline Frees, NP

## 2018-08-03 ENCOUNTER — Telehealth: Payer: Self-pay | Admitting: Adult Health

## 2018-08-03 NOTE — Telephone Encounter (Signed)
Reviewed in result notes 

## 2018-08-03 NOTE — Telephone Encounter (Unsigned)
Copied from CRM [id number]. Topic: General - Other >> Aug 03, 2018  5:31 PM Mcneil, Ja-Kwan wrote: Reason for CRM: Pt returned call for lab results. Pt requests call back. Cb# [phone]

## 2018-08-17 ENCOUNTER — Other Ambulatory Visit: Payer: Self-pay | Admitting: Gastroenterology

## 2018-08-17 DIAGNOSIS — R110 Nausea: Secondary | ICD-10-CM

## 2018-08-17 LAB — HEPATIC FUNCTION PANEL
ALK PHOS: 61 (ref 25–125)
ALT: 11 (ref 7–35)
AST: 13 (ref 13–35)
Bilirubin, Total: 0.5

## 2018-08-17 LAB — BASIC METABOLIC PANEL
BUN: 11 (ref 4–21)
CREATININE: 0.7 (ref 0.5–1.1)
Glucose: 87
Potassium: 3.9 (ref 3.4–5.3)
Sodium: 141 (ref 137–147)

## 2018-08-22 ENCOUNTER — Ambulatory Visit
Admission: RE | Admit: 2018-08-22 | Discharge: 2018-08-22 | Disposition: A | Discharge status: Home / Self Care | Payer: PRIVATE HEALTH INSURANCE | Source: Ambulatory Visit | Attending: Gastroenterology | Admitting: Gastroenterology

## 2018-08-22 DIAGNOSIS — R110 Nausea: Secondary | ICD-10-CM

## 2018-08-23 ENCOUNTER — Other Ambulatory Visit: Payer: Self-pay | Admitting: Adult Health

## 2018-08-23 DIAGNOSIS — K279 Peptic ulcer, site unspecified, unspecified as acute or chronic, without hemorrhage or perforation: Secondary | ICD-10-CM

## 2018-09-02 ENCOUNTER — Encounter: Payer: Self-pay | Admitting: Physician Assistant

## 2018-09-02 ENCOUNTER — Ambulatory Visit: Payer: PRIVATE HEALTH INSURANCE | Admitting: Physician Assistant

## 2018-09-02 VITALS — BP 110/80 | HR 78 | Temp 98.2°F | Ht 70.0 in | Wt 241.4 lb

## 2018-09-02 DIAGNOSIS — R6889 Other general symptoms and signs: Secondary | ICD-10-CM | POA: Diagnosis not present

## 2018-09-02 DIAGNOSIS — K279 Peptic ulcer, site unspecified, unspecified as acute or chronic, without hemorrhage or perforation: Secondary | ICD-10-CM | POA: Diagnosis not present

## 2018-09-02 LAB — POCT INFLUENZA A/B
INFLUENZA B, POC: NEGATIVE
Influenza A, POC: NEGATIVE

## 2018-09-02 MED ORDER — METHYLPREDNISOLONE 4 MG PO TBPK: ORAL_TABLET | ORAL | 0 refills | Status: DC

## 2018-09-02 MED ORDER — AZITHROMYCIN 250 MG PO TABS: ORAL_TABLET | ORAL | 0 refills | Status: DC

## 2018-09-02 MED ORDER — PANTOPRAZOLE SODIUM 40 MG PO TBEC: 40.0000 mg | DELAYED_RELEASE_TABLET | Freq: Every day | ORAL | 0 refills | Status: DC

## 2018-09-02 NOTE — Progress Notes (Signed)
Catherine [name] is a 48 y.o. female here for a new problem.  I acted as a Neurosurgeonscribe for Energy East CorporationSamantha Xue Low, PA-C Corky Mullonna Orphanos, LPN  History of Present Illness:   Chief Complaint  Patient presents with  . Cough    Cough  This is a new problem. Episode onset: Started in the middle of the night. The problem has been gradually worsening. The cough is productive of sputum (Expectorating light yellow/green). Associated symptoms include chills, headaches, nasal congestion, postnasal drip and a sore throat. Pertinent negatives include no ear congestion, ear pain, fever, shortness of breath or wheezing. The symptoms are aggravated by lying down. Risk factors for lung disease include occupational exposure. She has tried nothing for the symptoms. Her past medical history is significant for bronchitis and pneumonia.   Current smoker, down to 1/2 PPD.  Just finished protonix for possible PUD.  Symptoms started all of a sudden.   Past Medical History:  Diagnosis Date  . Anxiety   . Bipolar 1 disorder (HCC)   . Cystitis, interstitial   . Depression   . Hypertension   . Migraine      Social History   Socioeconomic History  . Marital status: Divorced    Spouse name: Not on file  . Number of children: Not on file  . Years of education: Not on file  . Highest education level: Not on file  Occupational History  . Not on file  Social Needs  . Financial resource strain: Not on file  . Food insecurity:    Worry: Not on file    Inability: Not on file  . Transportation needs:    Medical: Not on file    Non-medical: Not on file  Tobacco Use  . Smoking status: Current Every Day Smoker    Packs/day: 0.20    Types: Cigarettes  . Smokeless tobacco: Never Used  Substance and Sexual Activity  . Alcohol use: Yes    Alcohol/week: 0.0 standard drinks    Comment: occ  . Drug use: No  . Sexual activity: Never  Lifestyle  . Physical activity:    Days per week: Not on file    Minutes per  session: Not on file  . Stress: Not on file  Relationships  . Social connections:    Talks on phone: Not on file    Gets together: Not on file    Attends religious service: Not on file    Active member of club or organization: Not on file    Attends meetings of clubs or organizations: Not on file    Relationship status: Not on file  . Intimate partner violence:    Fear of current or ex partner: Not on file    Emotionally abused: Not on file    Physically abused: Not on file    Forced sexual activity: Not on file  Other Topics Concern  . Not on file  Social History Narrative   Working - Works at [employer]   Divorced   Two kids  - do not live with her. 21 and 24       Past Surgical History:  Procedure Laterality Date  . ABDOMINAL HYSTERECTOMY    . CYSTOSCOPY    . TOTAL ABDOMINAL HYSTERECTOMY W/ BILATERAL SALPINGOOPHORECTOMY      Family History  Problem Relation Age of Onset  . Sudden death Mother        suicide  . Hypothyroidism Sister     Allergies  Allergen Reactions  .  Amoxicillin-Pot Clavulanate     REACTION: SEVERE STOMACH CRAMPING See Penicillin entry.   . Divalproex Sodium     REACTION: nausea  . Latex     REACTION: contact dermatitis  . Penicillins     Has patient had a PCN reaction causing immediate rash, facial/tongue/throat swelling, SOB or lightheadedness with hypotension:  Has patient had a PCN reaction causing severe rash involving mucus membranes or skin necrosis: Has patient had a PCN reaction that required hospitalization Has patient had a PCN reaction occurring within the last 10 years: If all of the above answers are "NO", then may proceed with Cephalosporin use.     Current Medications:   Current Outpatient Medications:  .  lisinopril-hydrochlorothiazide (PRINZIDE,ZESTORETIC) 10-12.5 MG tablet, Take 1 tablet by mouth daily., Disp: 90 tablet, Rfl: 0 .  azithromycin (ZITHROMAX) 250 MG tablet, Take two tablets on day 1, then one  tablet daily x 4 days, Disp: 6 tablet, Rfl: 0 .  methylPREDNISolone (MEDROL DOSEPAK) 4 MG TBPK tablet, 6-5-4-3-2-1-off, Disp: 21 tablet, Rfl: 0 .  pantoprazole (PROTONIX) 40 MG tablet, Take 1 tablet (40 mg total) by mouth daily., Disp: 30 tablet, Rfl: 0   Review of Systems:   Review of Systems  Constitutional: Positive for chills. Negative for fever.  HENT: Positive for postnasal drip and sore throat. Negative for ear pain.   Respiratory: Positive for cough. Negative for shortness of breath and wheezing.   Neurological: Positive for headaches.    Vitals:   Vitals:   09/02/18 1247  BP: 110/80  Pulse: 78  Temp: 98.2 F (36.8 C)  TempSrc: Oral  SpO2: 96%  Weight: 241 lb 6.1 oz (109.5 kg)  Height: 5\' 10"  (1.778 m)     Body mass index is 34.63 kg/m.  Physical Exam:   Physical Exam Vitals signs and nursing note reviewed.  Constitutional:      General: She is not in acute distress.    Appearance: She is well-developed. She is not ill-appearing or toxic-appearing.  HENT:     Head: Normocephalic and atraumatic.     Right Ear: Tympanic membrane, ear canal and external ear normal. Tympanic membrane is not erythematous, retracted or bulging.     Left Ear: Tympanic membrane, ear canal and external ear normal. Tympanic membrane is not erythematous, retracted or bulging.     Nose: Nose normal.     Right Sinus: No maxillary sinus tenderness or frontal sinus tenderness.     Left Sinus: No maxillary sinus tenderness or frontal sinus tenderness.     Mouth/Throat:     Pharynx: Uvula midline. No posterior oropharyngeal erythema.  Eyes:     General: Lids are normal.     Conjunctiva/sclera: Conjunctivae normal.  Neck:     Trachea: Trachea normal.  Cardiovascular:     Rate and Rhythm: Normal rate and regular rhythm.     Heart sounds: Normal heart sounds, S1 normal and S2 normal.  Pulmonary:     Effort: Pulmonary effort is normal.     Breath sounds: Normal breath sounds. No decreased  breath sounds, wheezing, rhonchi or rales.  Lymphadenopathy:     Cervical: No cervical adenopathy.  Skin:    General: Skin is warm and dry.  Neurological:     Mental Status: She is alert.  Psychiatric:        Speech: Speech normal.        Behavior: Behavior normal. Behavior is cooperative.    Results for orders placed or performed in visit on  09/02/18  POCT Influenza A/B  Result Value Ref Range   Influenza A, POC Negative Negative   Influenza B, POC Negative Negative     Assessment and Plan:   Ieashia was seen today for cough.  Diagnoses and all orders for this visit:  Flu-like symptoms Flu test negative. No red flags on exam.  She has history of these symptoms turning in bronchitis quickly. Oral medrol dose pack prescribed. Provided azithromycin antibiotic to cover for atypicals given smoking status. Discussed taking medications as prescribed. Reviewed return precautions including worsening fever, SOB, worsening cough or other concerns. Push fluids and rest. I recommend that patient follow-up if symptoms worsen or persist despite treatment x 7-10 days, sooner if needed.  -     POCT Influenza A/B  Peptic ulcer disease Will extend protonix given that we are starting oral medrol. Prescribed 30 tabs but recommended she take for 1 week and reassess symptoms. -     pantoprazole (PROTONIX) 40 MG tablet; Take 1 tablet (40 mg total) by mouth daily.  Other orders -     methylPREDNISolone (MEDROL DOSEPAK) 4 MG TBPK tablet; 6-5-4-3-2-1-off -     azithromycin (ZITHROMAX) 250 MG tablet; Take two tablets on day 1, then one tablet daily x 4 days    . Reviewed expectations re: course of current medical issues. . Discussed self-management of symptoms. . Outlined signs and symptoms indicating need for more acute intervention. . Patient verbalized understanding and all questions were answered. . See orders for this visit as documented in the electronic medical record. . Patient received  an After-Visit Summary.  CMA or LPN served as scribe during this visit. History, Physical, and Plan performed by medical provider. The above documentation has been reviewed and is accurate and complete.  Jarold Motto, PA-C

## 2018-09-02 NOTE — Patient Instructions (Signed)
It was great to see you!  1. Flu looks negative! 2. Continue to work on smoking cessation. 3. Start oral steroid dose pack. 4. Because we are starting oral steroid, which can worsen your stomach issues, I have sent in protonix for you to take. Please take for at least 1 more week. 5. I have sent in azithromycin antibiotic for you to use if your cough worsens, or if you develop fever.  Push fluids and get plenty of rest. Please return if you are not improving as expected, or if you have high fevers (>101.5) or difficulty swallowing or worsening productive cough.  Call clinic with questions.  I hope you start feeling better soon!

## 2018-09-09 ENCOUNTER — Encounter: Payer: Self-pay | Admitting: Family Medicine

## 2018-09-16 ENCOUNTER — Ambulatory Visit (INDEPENDENT_AMBULATORY_CARE_PROVIDER_SITE_OTHER): Payer: PRIVATE HEALTH INSURANCE | Admitting: Adult Health

## 2018-09-16 ENCOUNTER — Encounter: Payer: Self-pay | Admitting: Adult Health

## 2018-09-16 VITALS — BP 100/60 | Temp 98.0°F | Ht 69.5 in | Wt 239.0 lb

## 2018-09-16 DIAGNOSIS — F17200 Nicotine dependence, unspecified, uncomplicated: Secondary | ICD-10-CM | POA: Diagnosis not present

## 2018-09-16 DIAGNOSIS — K219 Gastro-esophageal reflux disease without esophagitis: Secondary | ICD-10-CM

## 2018-09-16 DIAGNOSIS — Z0000 Encounter for general adult medical examination without abnormal findings: Secondary | ICD-10-CM

## 2018-09-16 DIAGNOSIS — I10 Essential (primary) hypertension: Secondary | ICD-10-CM

## 2018-09-16 DIAGNOSIS — Z23 Encounter for immunization: Secondary | ICD-10-CM | POA: Diagnosis not present

## 2018-09-16 DIAGNOSIS — J0110 Acute frontal sinusitis, unspecified: Secondary | ICD-10-CM

## 2018-09-16 DIAGNOSIS — Z1231 Encounter for screening mammogram for malignant neoplasm of breast: Secondary | ICD-10-CM

## 2018-09-16 DIAGNOSIS — Z114 Encounter for screening for human immunodeficiency virus [HIV]: Secondary | ICD-10-CM

## 2018-09-16 DIAGNOSIS — F319 Bipolar disorder, unspecified: Secondary | ICD-10-CM | POA: Diagnosis not present

## 2018-09-16 LAB — COMPREHENSIVE METABOLIC PANEL
ALT: 10 U/L (ref 0–35)
AST: 11 U/L (ref 0–37)
Albumin: 4.2 g/dL (ref 3.5–5.2)
Alkaline Phosphatase: 72 U/L (ref 39–117)
BUN: 12 mg/dL (ref 6–23)
CHLORIDE: 104 meq/L (ref 96–112)
CO2: 27 meq/L (ref 19–32)
CREATININE: 0.57 mg/dL (ref 0.40–1.20)
Calcium: 9.7 mg/dL (ref 8.4–10.5)
GFR: 113.44 mL/min (ref >60.00)
GLUCOSE: 88 mg/dL (ref 70–99)
Potassium: 4.2 mEq/L (ref 3.5–5.1)
SODIUM: 140 meq/L (ref 135–145)
Total Bilirubin: 0.6 mg/dL (ref 0.2–1.2)
Total Protein: 6.8 g/dL (ref 6.0–8.3)

## 2018-09-16 LAB — CBC WITH DIFFERENTIAL/PLATELET
Basophils Absolute: 0.1 10*3/uL (ref 0.0–0.1)
Basophils Relative: 0.6 % (ref 0.0–3.0)
EOS ABS: 0.1 10*3/uL (ref 0.0–0.7)
Eosinophils Relative: 1.1 % (ref 0.0–5.0)
HCT: 41.8 % (ref 36.0–46.0)
HEMOGLOBIN: 14.9 g/dL (ref 12.0–15.0)
Lymphocytes Relative: 28.9 % (ref 12.0–46.0)
Lymphs Abs: 2.8 10*3/uL (ref 0.7–4.0)
MCHC: 35.7 g/dL (ref 30.0–36.0)
MCV: 91.4 fl (ref 78.0–100.0)
MONO ABS: 0.5 10*3/uL (ref 0.1–1.0)
Monocytes Relative: 5.5 % (ref 3.0–12.0)
NEUTROS ABS: 6.2 10*3/uL (ref 1.4–7.7)
NEUTROS PCT: 63.9 % (ref 43.0–77.0)
PLATELETS: 259 10*3/uL (ref 150.0–400.0)
RBC: 4.57 Mil/uL (ref 3.87–5.11)
RDW: 13.9 % (ref 11.5–15.5)
WBC: 9.6 10*3/uL (ref 4.0–10.5)

## 2018-09-16 LAB — LIPID PANEL
CHOL/HDL RATIO: 4
Cholesterol: 213 mg/dL — ABNORMAL HIGH (ref 0–200)
HDL: 53.8 mg/dL (ref >39.00)
LDL CALC: 129 mg/dL — AB (ref 0–99)
NonHDL: 159.56
Triglycerides: 153 mg/dL — ABNORMAL HIGH (ref 0.0–149.0)
VLDL: 30.6 mg/dL (ref 0.0–40.0)

## 2018-09-16 LAB — TSH: TSH: 1.39 u[IU]/mL (ref 0.35–4.50)

## 2018-09-16 MED ORDER — NICOTINE POLACRILEX 4 MG MT LOZG: 4.0000 mg | LOZENGE | OROMUCOSAL | 1 refills | Status: DC | PRN

## 2018-09-16 MED ORDER — DOXYCYCLINE HYCLATE 100 MG PO CAPS: 100.0000 mg | ORAL_CAPSULE | Freq: Two times a day (BID) | ORAL | 0 refills | Status: DC

## 2018-09-16 NOTE — Patient Instructions (Signed)
It was great seeing you today   Please call the breast center for your mammogram   We will follow up with you regarding your blood work   I have sent in a prescription for doxycycline to help clear up your sinus infection   Please quit smoking

## 2018-09-16 NOTE — Progress Notes (Signed)
Subjective:    Patient ID: Catherine [name], female    DOB: Apr 08, 1971, 48 y.o.   MRN: [id number]  HPI  Patient presents for yearly preventative medicine examination. She is a pleasant 48 year old female who  has a past medical history of Anxiety, Bipolar 1 disorder (HCC), Cystitis, interstitial, Depression, Hypertension, and Migraine.  Essential Hypertension - controlled with lisinopril/HCTZ BP Readings from Last 3 Encounters:  09/16/18 100/60  09/02/18 110/80  08/02/18 112/84   BiPolar - no longer taking medications. Was on lamictal in the past but stopped over two years ago.   Tobacco use - she has cut back to 0.5 pack or less per day. Is leaving her cigarettes at home during the day and is no longer smoking in the car.   Sinusitis. - she was seen by another provider about two weeks ago for URI like symptoms. She was given a prednisone course and azithromycin which she completed. She reports that she feels slightly improved but continues to have sinus pain and pressure, productive cough, and rhinorrhea - she continues to smoke   All immunizations and health maintenance protocols were reviewed with the patient and needed orders were placed. She is due for influenza   Appropriate screening laboratory values were ordered for the patient including screening of hyperlipidemia, renal function and hepatic function.  Medication reconciliation,  past medical history, social history, problem list and allergies were reviewed in detail with the patient  Goals were established with regard to weight loss, exercise, and  diet in compliance with medications  She is due for a mammogram. She does breast exams at home and not noticed any changes    Review of Systems  Constitutional: Negative.   HENT: Positive for postnasal drip, rhinorrhea and sinus pressure. Negative for sinus pain and tinnitus.   Eyes: Negative.   Respiratory: Positive for cough.   Cardiovascular: Negative.     Gastrointestinal: Negative.   Endocrine: Negative.   Genitourinary: Negative.   Musculoskeletal: Negative.   Skin: Negative.   Allergic/Immunologic: Negative.   Neurological: Negative.   Hematological: Negative.   Psychiatric/Behavioral: Negative.    Past Medical History:  Diagnosis Date  . Anxiety   . Bipolar 1 disorder (HCC)   . Cystitis, interstitial   . Depression   . Hypertension   . Migraine     Social History   Socioeconomic History  . Marital status: Divorced    Spouse name: Not on file  . Number of children: Not on file  . Years of education: Not on file  . Highest education level: Not on file  Occupational History  . Not on file  Social Needs  . Financial resource strain: Not on file  . Food insecurity:    Worry: Not on file    Inability: Not on file  . Transportation needs:    Medical: Not on file    Non-medical: Not on file  Tobacco Use  . Smoking status: Current Every Day Smoker    Packs/day: 0.20    Types: Cigarettes  . Smokeless tobacco: Never Used  Substance and Sexual Activity  . Alcohol use: Yes    Alcohol/week: 0.0 standard drinks    Comment: occ  . Drug use: No  . Sexual activity: Never  Lifestyle  . Physical activity:    Days per week: Not on file    Minutes per session: Not on file  . Stress: Not on file  Relationships  . Social connections:    Talks  on phone: Not on file    Gets together: Not on file    Attends religious service: Not on file    Active member of club or organization: Not on file    Attends meetings of clubs or organizations: Not on file    Relationship status: Not on file  . Intimate partner violence:    Fear of current or ex partner: Not on file    Emotionally abused: Not on file    Physically abused: Not on file    Forced sexual activity: Not on file  Other Topics Concern  . Not on file  Social History Narrative   Working - Works at [employer]   Divorced   Two kids  - do not live with her. 21  and 24       Past Surgical History:  Procedure Laterality Date  . ABDOMINAL HYSTERECTOMY    . CYSTOSCOPY    . TOTAL ABDOMINAL HYSTERECTOMY W/ BILATERAL SALPINGOOPHORECTOMY      Family History  Problem Relation Age of Onset  . Sudden death Mother        suicide  . Hypothyroidism Sister     Allergies  Allergen Reactions  . Amoxicillin-Pot Clavulanate     REACTION: SEVERE STOMACH CRAMPING See Penicillin entry.   . Divalproex Sodium     REACTION: nausea  . Latex     REACTION: contact dermatitis  . Penicillins     Has patient had a PCN reaction causing immediate rash, facial/tongue/throat swelling, SOB or lightheadedness with hypotension:  Has patient had a PCN reaction causing severe rash involving mucus membranes or skin necrosis: Has patient had a PCN reaction that required hospitalization Has patient had a PCN reaction occurring within the last 10 years: If all of the above answers are "NO", then may proceed with Cephalosporin use.     Current Outpatient Medications on File Prior to Visit  Medication Sig Dispense Refill  . lisinopril-hydrochlorothiazide (PRINZIDE,ZESTORETIC) 10-12.5 MG tablet Take 1 tablet by mouth daily. 90 tablet 0  . pantoprazole (PROTONIX) 40 MG tablet Take 1 tablet (40 mg total) by mouth daily. 30 tablet 0   No current facility-administered medications on file prior to visit.     BP 100/60   Temp 98 F (36.7 C)   Ht 5' 9.5" (1.765 m)   Wt 239 lb (108.4 kg)   BMI 34.79 kg/m       Objective:   Physical Exam Vitals signs and nursing note reviewed.  Constitutional:      General: She is not in acute distress.    Appearance: Normal appearance. She is well-developed. She is obese.  HENT:     Head: Normocephalic and atraumatic.     Right Ear: Hearing, tympanic membrane, ear canal and external ear normal. There is no impacted cerumen.     Left Ear: Hearing, tympanic membrane, ear canal and external ear normal. There is no impacted cerumen.      Nose: Mucosal edema present. No congestion or rhinorrhea.     Right Turbinates: Enlarged and swollen.     Left Turbinates: Enlarged and swollen.     Right Sinus: Frontal sinus tenderness present.     Left Sinus: Frontal sinus tenderness present.     Mouth/Throat:     Mouth: Mucous membranes are moist.     Pharynx: Oropharynx is clear. Uvula midline. No pharyngeal swelling, oropharyngeal exudate, posterior oropharyngeal erythema or uvula swelling.     Tonsils: No tonsillar exudate.  Eyes:  General:        Right eye: No discharge.        Left eye: No discharge.     Extraocular Movements: Extraocular movements intact.     Conjunctiva/sclera: Conjunctivae normal.     Pupils: Pupils are equal, round, and reactive to light.  Neck:     Musculoskeletal: Normal range of motion and neck supple. No neck rigidity or muscular tenderness.     Thyroid: No thyromegaly.     Vascular: No carotid bruit.     Trachea: No tracheal deviation.  Cardiovascular:     Rate and Rhythm: Normal rate and regular rhythm.     Pulses: Normal pulses.     Heart sounds: Normal heart sounds. No murmur. No friction rub. No gallop.   Pulmonary:     Effort: Pulmonary effort is normal. No respiratory distress.     Breath sounds: Normal breath sounds. No stridor. No wheezing, rhonchi or rales.  Chest:     Chest wall: No tenderness.     Breasts:        Right: Normal.        Left: Normal.  Abdominal:     General: Bowel sounds are normal. There is no distension.     Palpations: Abdomen is soft. There is no mass.     Tenderness: There is no abdominal tenderness. There is no right CVA tenderness, left CVA tenderness, guarding or rebound.     Hernia: No hernia is present.  Musculoskeletal: Normal range of motion.        General: No swelling, tenderness, deformity or signs of injury.     Right lower leg: No edema.     Left lower leg: No edema.  Lymphadenopathy:     Cervical: No cervical adenopathy.  Skin:    General:  Skin is warm and dry.     Capillary Refill: Capillary refill takes less than 2 seconds.     Coloration: Skin is not jaundiced or pale.     Findings: No bruising, erythema, lesion or rash.  Neurological:     General: No focal deficit present.     Mental Status: She is alert and oriented to person, place, and time. Mental status is at baseline.     Cranial Nerves: No cranial nerve deficit.     Sensory: No sensory deficit.     Motor: No weakness.     Coordination: Coordination normal.     Gait: Gait normal.     Deep Tendon Reflexes: Reflexes normal.  Psychiatric:        Mood and Affect: Mood normal.        Behavior: Behavior normal.        Thought Content: Thought content normal.        Judgment: Judgment normal.        Assessment & Plan:  1. Routine general medical examination at a health care facility - needs to quit smoking  - Work on weight loss through diet and exercise  - CBC with Differential/Platelet - Comprehensive metabolic panel - Lipid panel - TSH  2. Bipolar 1 disorder (HCC) - controlled without medications   3. Essential hypertension - Well controlled. No change in medications  - CBC with Differential/Platelet - Comprehensive metabolic panel - Lipid panel - TSH  4. TOBACCO USE - nicotine polacrilex (NICOTINE MINI) 4 MG lozenge; Take 1 lozenge (4 mg total) by mouth as needed for smoking cessation.  Dispense: 108 tablet; Refill: 1  5. Gastroesophageal reflux disease without  esophagitis - Continue with protonix   6. Encounter for screening for HIV - HIV Antibody (routine testing w rflx)  7. Acute non-recurrent frontal sinusitis  - doxycycline (VIBRAMYCIN) 100 MG capsule; Take 1 capsule (100 mg total) by mouth 2 (two) times daily.  Dispense: 14 capsule; Refill: 0 - Follow up if not resolved after abx therapy  8. Need for influenza vaccination  - Flu Vaccine QUAD 6+ mos PF IM (Fluarix Quad PF)  9. Screening mammogram, encounter for  - MM DIGITAL  SCREENING BILATERAL; Future  Shirline Frees, NP

## 2018-09-17 LAB — HIV ANTIBODY (ROUTINE TESTING W REFLEX): HIV 1&2 Ab, 4th Generation: NONREACTIVE

## 2018-10-07 ENCOUNTER — Ambulatory Visit: Payer: PRIVATE HEALTH INSURANCE | Admitting: Adult Health

## 2018-10-07 ENCOUNTER — Encounter: Payer: Self-pay | Admitting: Adult Health

## 2018-10-07 VITALS — BP 120/80 | Temp 98.3°F | Wt 242.0 lb

## 2018-10-07 DIAGNOSIS — F319 Bipolar disorder, unspecified: Secondary | ICD-10-CM | POA: Diagnosis not present

## 2018-10-07 DIAGNOSIS — F17200 Nicotine dependence, unspecified, uncomplicated: Secondary | ICD-10-CM | POA: Diagnosis not present

## 2018-10-07 MED ORDER — BUPROPION HCL ER (SR) 150 MG PO TB12: 150.0000 mg | ORAL_TABLET | Freq: Two times a day (BID) | ORAL | 1 refills | Status: DC

## 2018-10-07 NOTE — Progress Notes (Signed)
Subjective:    Patient ID: Catherine [name], female    DOB: Jan 26, 1971, 48 y.o.   MRN: [id number]  HPI  48 year old female who  has a past medical history of Anxiety, Bipolar 1 disorder (HCC), Cystitis, interstitial, Depression, Hypertension, and Migraine.  She was diagnosed with Bipolar 1 about 13 years ago. Was placed on Lamictal in the past but stopped taking this medication 2-3 years ago. She has felt fine for awhile but during the last few months she has been feeling more anxious and depressed. She reports feeling as though she sleeps more than usually, is constantly fatigued and does not enjoy doing things that she once did.   She was previously on Lamictal, Abilify, and Wellbutrin - from chart review. She felt as though wellbutrin worked pretty well.   She denies mania or suicidal thoughts   She continues to smoke   Review of Systems See HPI   Past Medical History:  Diagnosis Date  . Anxiety   . Bipolar 1 disorder (HCC)   . Cystitis, interstitial   . Depression   . Hypertension   . Migraine     Social History   Socioeconomic History  . Marital status: Divorced    Spouse name: Not on file  . Number of children: Not on file  . Years of education: Not on file  . Highest education level: Not on file  Occupational History  . Not on file  Social Needs  . Financial resource strain: Not on file  . Food insecurity:    Worry: Not on file    Inability: Not on file  . Transportation needs:    Medical: Not on file    Non-medical: Not on file  Tobacco Use  . Smoking status: Current Every Day Smoker    Packs/day: 0.20    Types: Cigarettes  . Smokeless tobacco: Never Used  Substance and Sexual Activity  . Alcohol use: Yes    Alcohol/week: 0.0 standard drinks    Comment: occ  . Drug use: No  . Sexual activity: Never  Lifestyle  . Physical activity:    Days per week: Not on file    Minutes per session: Not on file  . Stress: Not on file  Relationships  .  Social connections:    Talks on phone: Not on file    Gets together: Not on file    Attends religious service: Not on file    Active member of club or organization: Not on file    Attends meetings of clubs or organizations: Not on file    Relationship status: Not on file  . Intimate partner violence:    Fear of current or ex partner: Not on file    Emotionally abused: Not on file    Physically abused: Not on file    Forced sexual activity: Not on file  Other Topics Concern  . Not on file  Social History Narrative   Working - Works at [employer]   Divorced   Two kids  - do not live with her. 21 and 24       Past Surgical History:  Procedure Laterality Date  . ABDOMINAL HYSTERECTOMY    . CYSTOSCOPY    . TOTAL ABDOMINAL HYSTERECTOMY W/ BILATERAL SALPINGOOPHORECTOMY      Family History  Problem Relation Age of Onset  . Sudden death Mother        suicide  . Hypothyroidism Sister     Allergies  Allergen Reactions  . Amoxicillin-Pot Clavulanate     REACTION: SEVERE STOMACH CRAMPING See Penicillin entry.   . Divalproex Sodium     REACTION: nausea  . Latex     REACTION: contact dermatitis  . Penicillins     Has patient had a PCN reaction causing immediate rash, facial/tongue/throat swelling, SOB or lightheadedness with hypotension:  Has patient had a PCN reaction causing severe rash involving mucus membranes or skin necrosis: Has patient had a PCN reaction that required hospitalization Has patient had a PCN reaction occurring within the last 10 years: If all of the above answers are "NO", then may proceed with Cephalosporin use.     Current Outpatient Medications on File Prior to Visit  Medication Sig Dispense Refill  . lisinopril-hydrochlorothiazide (PRINZIDE,ZESTORETIC) 10-12.5 MG tablet Take 1 tablet by mouth daily. 90 tablet 0  . nicotine polacrilex (NICOTINE MINI) 4 MG lozenge Take 1 lozenge (4 mg total) by mouth as needed for smoking cessation. 108 tablet  1  . pantoprazole (PROTONIX) 40 MG tablet Take 1 tablet (40 mg total) by mouth daily. 30 tablet 0   No current facility-administered medications on file prior to visit.     BP 120/80   Temp 98.3 F (36.8 C)   Wt 242 lb (109.8 kg)   BMI 35.22 kg/m       Objective:   Physical Exam Vitals signs and nursing note reviewed.  Constitutional:      Appearance: Normal appearance.  Neck:     Musculoskeletal: Normal range of motion and neck supple.  Cardiovascular:     Rate and Rhythm: Normal rate and regular rhythm.     Pulses: Normal pulses.     Heart sounds: Normal heart sounds.  Pulmonary:     Effort: Pulmonary effort is normal.     Breath sounds: Normal breath sounds.  Neurological:     General: No focal deficit present.     Mental Status: She is alert and oriented to person, place, and time.  Psychiatric:        Mood and Affect: Mood normal.        Thought Content: Thought content normal.        Judgment: Judgment normal.           Assessment & Plan:  Will place on Wellbutrin 150 mg BID. Hopefully this will help her quit smoking too. Follow up in one month or sooner if needed. Advised to go to the ER with any thoughts of suicide or self harm   Shirline Frees, NP

## 2018-10-29 ENCOUNTER — Other Ambulatory Visit: Payer: Self-pay | Admitting: Adult Health

## 2018-10-29 DIAGNOSIS — F17200 Nicotine dependence, unspecified, uncomplicated: Secondary | ICD-10-CM

## 2018-10-29 DIAGNOSIS — F319 Bipolar disorder, unspecified: Secondary | ICD-10-CM

## 2018-11-01 NOTE — Telephone Encounter (Signed)
Spoke to the pt and she is now scheduled for follow up on 11/09/2018.  This request denied.  Filled for 2 months in February.  Nothing further needed.

## 2018-11-09 ENCOUNTER — Ambulatory Visit: Payer: PRIVATE HEALTH INSURANCE | Admitting: Adult Health

## 2018-11-09 ENCOUNTER — Ambulatory Visit: Payer: PRIVATE HEALTH INSURANCE

## 2018-11-23 ENCOUNTER — Other Ambulatory Visit: Payer: Self-pay | Admitting: Adult Health

## 2018-11-23 DIAGNOSIS — F319 Bipolar disorder, unspecified: Secondary | ICD-10-CM

## 2018-11-23 DIAGNOSIS — I10 Essential (primary) hypertension: Secondary | ICD-10-CM

## 2018-11-23 DIAGNOSIS — F17200 Nicotine dependence, unspecified, uncomplicated: Secondary | ICD-10-CM

## 2018-11-24 ENCOUNTER — Other Ambulatory Visit: Payer: Self-pay

## 2018-11-24 ENCOUNTER — Ambulatory Visit (INDEPENDENT_AMBULATORY_CARE_PROVIDER_SITE_OTHER): Payer: PRIVATE HEALTH INSURANCE | Admitting: Adult Health

## 2018-11-24 ENCOUNTER — Encounter: Payer: Self-pay | Admitting: Adult Health

## 2018-11-24 DIAGNOSIS — F419 Anxiety disorder, unspecified: Secondary | ICD-10-CM | POA: Diagnosis not present

## 2018-11-24 DIAGNOSIS — F17200 Nicotine dependence, unspecified, uncomplicated: Secondary | ICD-10-CM | POA: Diagnosis not present

## 2018-11-24 DIAGNOSIS — F319 Bipolar disorder, unspecified: Secondary | ICD-10-CM

## 2018-11-24 DIAGNOSIS — I10 Essential (primary) hypertension: Secondary | ICD-10-CM

## 2018-11-24 DIAGNOSIS — F329 Major depressive disorder, single episode, unspecified: Secondary | ICD-10-CM

## 2018-11-24 MED ORDER — BUPROPION HCL ER (XL) 300 MG PO TB24: 300.0000 mg | ORAL_TABLET | Freq: Every day | ORAL | 0 refills | Status: DC

## 2018-11-24 MED ORDER — LISINOPRIL-HYDROCHLOROTHIAZIDE 10-12.5 MG PO TABS: 1.0000 | ORAL_TABLET | Freq: Every day | ORAL | 1 refills | Status: DC

## 2018-11-24 NOTE — Progress Notes (Signed)
Virtual Visit via Video Note  I connected with Catherine [name] on 11/24/18 at  1:00 PM EDT by a video enabled telemedicine application and verified that I am speaking with the correct person using two identifiers.  Location patient: home Location provider:work or home office Persons participating in the virtual visit: patient, provider  I discussed the limitations of evaluation and management by telemedicine and the availability of in person appointments. The patient expressed understanding and agreed to proceed.   HPI:  48 year old female who is being evaluated today for one-month follow-up regarding depression.  She was diagnosed with bipolar disorder about 13 years ago and was placed on Lamictal in the past but stopped taking this medication approximately 2 to 3 years ago due to "feeling fine".  When she was last seen she complained of depression and anxiety for 2 to 3 months prior to being seen.  She reported feeling as though she was sleeping more than usual is constantly fatigued and did not enjoy doing things that she once did.  She had been placed on Wellbutrin in the past and felt as though she responded well to this.  She was placed back on Wellbutrin 150 mg twice daily.  She reports that that since starting back on Wellbutrin she has noticed that she is " less snappy" and is starting to feel less depressed. She also reports that she has cut back significantly on smoking and is ready to quit.   She would like to try increasing Wellbutrin to 300 mg BID to see if she can get more improvement from an increased dose.   No SI/HI   ROS: See pertinent positives and negatives per HPI.  Past Medical History:  Diagnosis Date  . Anxiety   . Bipolar 1 disorder (HCC)   . Cystitis, interstitial   . Depression   . Hypertension   . Migraine     Past Surgical History:  Procedure Laterality Date  . ABDOMINAL HYSTERECTOMY    . CYSTOSCOPY    . TOTAL ABDOMINAL HYSTERECTOMY W/ BILATERAL  SALPINGOOPHORECTOMY      Family History  Problem Relation Age of Onset  . Sudden death Mother        suicide  . Hypothyroidism Sister      Current Outpatient Medications:  .  buPROPion (WELLBUTRIN SR) 150 MG 12 hr tablet, Take 1 tablet (150 mg total) by mouth 2 (two) times daily for 30 days., Disp: 60 tablet, Rfl: 1 .  buPROPion (WELLBUTRIN XL) 300 MG 24 hr tablet, Take 1 tablet (300 mg total) by mouth daily., Disp: 30 tablet, Rfl: 0 .  lisinopril-hydrochlorothiazide (PRINZIDE,ZESTORETIC) 10-12.5 MG tablet, Take 1 tablet by mouth daily., Disp: 90 tablet, Rfl: 1 .  nicotine polacrilex (NICOTINE MINI) 4 MG lozenge, Take 1 lozenge (4 mg total) by mouth as needed for smoking cessation., Disp: 108 tablet, Rfl: 1 .  pantoprazole (PROTONIX) 40 MG tablet, Take 1 tablet (40 mg total) by mouth daily., Disp: 30 tablet, Rfl: 0  EXAM:  VITALS per patient if applicable:  GENERAL: alert, oriented, appears well and in no acute distress  HEENT: atraumatic, conjunttiva clear, no obvious abnormalities on inspection of external nose and ears  NECK: normal movements of the head and neck  LUNGS: on inspection no signs of respiratory distress, breathing rate appears normal, no obvious gross SOB, gasping or wheezing  CV: no obvious cyanosis  MS: moves all visible extremities without noticeable abnormality  PSYCH/NEURO: pleasant and cooperative, no obvious depression or anxiety, speech and  thought processing grossly intact  ASSESSMENT AND PLAN:  Discussed the following assessment and plan:  Anxiety and depression - Plan: buPROPion (WELLBUTRIN XL) 300 MG 24 hr tablet  Essential hypertension - Plan: lisinopril-hydrochlorothiazide (PRINZIDE,ZESTORETIC) 10-12.5 MG tablet  TOBACCO USE - Plan: buPROPion (WELLBUTRIN XL) 300 MG 24 hr tablet  Bipolar 1 disorder (HCC)  Follow up in 30 days    I discussed the assessment and treatment plan with the patient. The patient was provided an opportunity to ask  questions and all were answered. The patient agreed with the plan and demonstrated an understanding of the instructions.   The patient was advised to call back or seek an in-person evaluation if the symptoms worsen or if the condition fails to improve as anticipated.  I provided 20 minutes of non-face-to-face time during this encounter.   Shirline Frees, NP

## 2018-11-28 NOTE — Telephone Encounter (Signed)
Denied.  Filled on 11/24/2018 in OV.  Message sent to the pharmacy.

## 2018-11-28 NOTE — Telephone Encounter (Signed)
Filled on 11/24/2018 in OV.  Message was sent to the pharmacy to check file.

## 2018-11-29 ENCOUNTER — Other Ambulatory Visit: Payer: Self-pay | Admitting: Adult Health

## 2018-11-29 DIAGNOSIS — F17200 Nicotine dependence, unspecified, uncomplicated: Secondary | ICD-10-CM

## 2018-11-29 DIAGNOSIS — F319 Bipolar disorder, unspecified: Secondary | ICD-10-CM

## 2018-11-30 NOTE — Telephone Encounter (Signed)
Denied.  Medication has been increased to 300 mg.  Message sent to the pharmacy to d/c 150 mg strength.

## 2018-12-16 ENCOUNTER — Other Ambulatory Visit: Payer: Self-pay | Admitting: Adult Health

## 2018-12-16 DIAGNOSIS — F17200 Nicotine dependence, unspecified, uncomplicated: Secondary | ICD-10-CM

## 2018-12-16 DIAGNOSIS — F419 Anxiety disorder, unspecified: Principal | ICD-10-CM

## 2018-12-16 DIAGNOSIS — F329 Major depressive disorder, single episode, unspecified: Secondary | ICD-10-CM

## 2018-12-16 DIAGNOSIS — F32A Depression, unspecified: Secondary | ICD-10-CM

## 2018-12-16 NOTE — Telephone Encounter (Signed)
Rx sent for a 30 day supply.  I left a message for the pt to return my call as she is due for a follow up appt.

## 2018-12-27 ENCOUNTER — Encounter: Payer: Self-pay | Admitting: Adult Health

## 2018-12-27 ENCOUNTER — Ambulatory Visit (INDEPENDENT_AMBULATORY_CARE_PROVIDER_SITE_OTHER): Payer: PRIVATE HEALTH INSURANCE | Admitting: Adult Health

## 2018-12-27 ENCOUNTER — Other Ambulatory Visit: Payer: Self-pay

## 2018-12-27 DIAGNOSIS — F329 Major depressive disorder, single episode, unspecified: Secondary | ICD-10-CM

## 2018-12-27 DIAGNOSIS — F17200 Nicotine dependence, unspecified, uncomplicated: Secondary | ICD-10-CM

## 2018-12-27 DIAGNOSIS — F419 Anxiety disorder, unspecified: Secondary | ICD-10-CM | POA: Diagnosis not present

## 2018-12-27 MED ORDER — SERTRALINE HCL 25 MG PO TABS: 25.0000 mg | ORAL_TABLET | Freq: Every day | ORAL | 1 refills | Status: DC

## 2018-12-27 NOTE — Progress Notes (Signed)
Virtual Visit via Video Note  I connected with Catherine [name] on 12/27/18 at  1:00 PM EDT by a video enabled telemedicine application and verified that I am speaking with the correct person using two identifiers.  Location patient: home Location provider:work or home office Persons participating in the virtual visit: patient, provider  I discussed the limitations of evaluation and management by telemedicine and the availability of in person appointments. The patient expressed understanding and agreed to proceed.   HPI:  48 year old female who is being evaluated today for 30-day follow-up regarding anxiety and depression.  During her last visit Cammie Mcgee was increased from 150 mg to 300 mg daily in the hopes that this would help further in managing her anxiety and depression.  She reports that she has noticed no change since the increase.  She continues to feel some degree of depression as well as anxiety.  She is happy to report that the increase in Wellbutrin has significantly cut back on the amount she is smoking and she is currently down to about 2 cigarettes a day.  She is also supplementing with nicotine lozenges.  ROS: See pertinent positives and negatives per HPI.  Past Medical History:  Diagnosis Date  . Anxiety   . Bipolar 1 disorder (HCC)   . Cystitis, interstitial   . Depression   . Hypertension   . Migraine     Past Surgical History:  Procedure Laterality Date  . ABDOMINAL HYSTERECTOMY    . CYSTOSCOPY    . TOTAL ABDOMINAL HYSTERECTOMY W/ BILATERAL SALPINGOOPHORECTOMY      Family History  Problem Relation Age of Onset  . Sudden death Mother        suicide  . Hypothyroidism Sister      Current Outpatient Medications:  .  buPROPion (WELLBUTRIN XL) 300 MG 24 hr tablet, TAKE 1 TABLET BY MOUTH EVERY DAY, Disp: 30 tablet, Rfl: 0 .  lisinopril-hydrochlorothiazide (PRINZIDE,ZESTORETIC) 10-12.5 MG tablet, Take 1 tablet by mouth daily., Disp: 90 tablet, Rfl: 1 .   nicotine polacrilex (NICOTINE MINI) 4 MG lozenge, Take 1 lozenge (4 mg total) by mouth as needed for smoking cessation., Disp: 108 tablet, Rfl: 1 .  pantoprazole (PROTONIX) 40 MG tablet, Take 1 tablet (40 mg total) by mouth daily., Disp: 30 tablet, Rfl: 0 .  sertraline (ZOLOFT) 25 MG tablet, Take 1 tablet (25 mg total) by mouth daily., Disp: 30 tablet, Rfl: 1  EXAM:  VITALS per patient if applicable:  GENERAL: alert, oriented, appears well and in no acute distress  HEENT: atraumatic, conjunttiva clear, no obvious abnormalities on inspection of external nose and ears  NECK: normal movements of the head and neck  LUNGS: on inspection no signs of respiratory distress, breathing rate appears normal, no obvious gross SOB, gasping or wheezing  CV: no obvious cyanosis  MS: moves all visible extremities without noticeable abnormality  PSYCH/NEURO: pleasant and cooperative, no obvious depression or anxiety, speech and thought processing grossly intact  ASSESSMENT AND PLAN:  Discussed the following assessment and plan:  Anxiety and depression - Plan: sertraline (ZOLOFT) 25 MG tablet  TOBACCO USE  We will add Zoloft 25 mg daily to her regimen.  She was congratulated on cutting back smoking.  I hope that she has quit completely by the next time we talked to each other.  We reviewed side effects of Zoloft and SSRIs.  We will follow-up in 30 days or sooner if needed   I discussed the assessment and treatment plan with the patient.  The patient was provided an opportunity to ask questions and all were answered. The patient agreed with the plan and demonstrated an understanding of the instructions.   The patient was advised to call back or seek an in-person evaluation if the symptoms worsen or if the condition fails to improve as anticipated.   Dorothyann Peng, NP

## 2019-01-18 ENCOUNTER — Other Ambulatory Visit: Payer: Self-pay | Admitting: Adult Health

## 2019-01-18 DIAGNOSIS — F329 Major depressive disorder, single episode, unspecified: Secondary | ICD-10-CM

## 2019-01-18 DIAGNOSIS — F17200 Nicotine dependence, unspecified, uncomplicated: Secondary | ICD-10-CM

## 2019-01-18 DIAGNOSIS — F419 Anxiety disorder, unspecified: Secondary | ICD-10-CM

## 2019-02-16 ENCOUNTER — Telehealth: Payer: Self-pay | Admitting: *Deleted

## 2019-02-16 ENCOUNTER — Other Ambulatory Visit: Payer: Self-pay | Admitting: Adult Health

## 2019-02-16 DIAGNOSIS — F329 Major depressive disorder, single episode, unspecified: Secondary | ICD-10-CM

## 2019-02-16 DIAGNOSIS — F32A Depression, unspecified: Secondary | ICD-10-CM

## 2019-02-16 NOTE — Telephone Encounter (Signed)
Left a message for a return call.  Pt needs virtual visit with [facility] for follow up.  CRM created.

## 2019-02-16 NOTE — Telephone Encounter (Signed)
Copied from [place] [id number]. Topic: General - Other >> Feb 16, 2019  4:33 PM Rainey Pines A wrote: Patient returned Misty call to set up an appointment.

## 2019-02-17 ENCOUNTER — Other Ambulatory Visit: Payer: Self-pay

## 2019-02-17 ENCOUNTER — Encounter: Payer: Self-pay | Admitting: Adult Health

## 2019-02-17 ENCOUNTER — Ambulatory Visit (INDEPENDENT_AMBULATORY_CARE_PROVIDER_SITE_OTHER): Payer: PRIVATE HEALTH INSURANCE | Admitting: Adult Health

## 2019-02-17 DIAGNOSIS — F17200 Nicotine dependence, unspecified, uncomplicated: Secondary | ICD-10-CM | POA: Diagnosis not present

## 2019-02-17 DIAGNOSIS — F419 Anxiety disorder, unspecified: Secondary | ICD-10-CM

## 2019-02-17 DIAGNOSIS — F329 Major depressive disorder, single episode, unspecified: Secondary | ICD-10-CM

## 2019-02-17 DIAGNOSIS — F32A Depression, unspecified: Secondary | ICD-10-CM

## 2019-02-17 MED ORDER — BUPROPION HCL ER (XL) 300 MG PO TB24: 300.0000 mg | ORAL_TABLET | Freq: Every day | ORAL | 1 refills | Status: DC

## 2019-02-17 NOTE — Telephone Encounter (Signed)
Pt now scheduled to see Tommi Rumps in virtual visit.

## 2019-02-17 NOTE — Progress Notes (Signed)
Virtual Visit via Video Note  I connected with Catherine [name] on 02/17/19 at  2:00 PM EDT by a video enabled telemedicine application and verified that I am speaking with the correct person using two identifiers.  Location patient: home Location provider:work or home office Persons participating in the virtual visit: patient, provider  I discussed the limitations of evaluation and management by telemedicine and the availability of in person appointments. The patient expressed understanding and agreed to proceed.   HPI: 48 year old female who is being evaluated today for follow-up regarding anxiety/depression and tobacco use.  She was previously placed on Wellbutrin in hopes that would help her further manage her anxiety and depression.  In the last visit she reported that she had noticed no improvement in her symptoms but that she had cut back significantly on the amount that she was smoking to about 2 cigarettes a day. At this time Zoloft 25 mg was added to her regimen.   Patient reports that since starting Zoloft she has noticed some significant improvement in anxiety and depression.  She is wondering if she can go up slightly on the medication to see if she can get any more benefit.  Unfortunately, she has been out of Wellbutrin for the last month and forgot to call in to get a refill.  She has started smoking again and the amount that she smokes per day varies depending on the stress from her job.  Per patient some days she will smoke any cigarettes and other days she will smoke a pack a day.  Would like to go back on Wellbutrin to help her cut back on smoking.  ROS: See pertinent positives and negatives per HPI.  Past Medical History:  Diagnosis Date  . Anxiety   . Bipolar 1 disorder ([place])   . Cystitis, interstitial   . Depression   . Hypertension   . Migraine     Past Surgical History:  Procedure Laterality Date  . ABDOMINAL HYSTERECTOMY    . CYSTOSCOPY    . TOTAL ABDOMINAL  HYSTERECTOMY W/ BILATERAL SALPINGOOPHORECTOMY      Family History  Problem Relation Age of Onset  . Sudden death Mother        suicide  . Hypothyroidism Sister      Current Outpatient Medications:  .  buPROPion (WELLBUTRIN XL) 300 MG 24 hr tablet, Take 1 tablet (300 mg total) by mouth daily., Disp: 90 tablet, Rfl: 1 .  lisinopril-hydrochlorothiazide (PRINZIDE,ZESTORETIC) 10-12.5 MG tablet, Take 1 tablet by mouth daily., Disp: 90 tablet, Rfl: 1 .  nicotine polacrilex (NICOTINE MINI) 4 MG lozenge, Take 1 lozenge (4 mg total) by mouth as needed for smoking cessation., Disp: 108 tablet, Rfl: 1 .  pantoprazole (PROTONIX) 40 MG tablet, Take 1 tablet (40 mg total) by mouth daily., Disp: 30 tablet, Rfl: 0 .  sertraline (ZOLOFT) 50 MG tablet, Take 1 tablet (50 mg total) by mouth daily., Disp: 90 tablet, Rfl: 1  EXAM:  VITALS per patient if applicable:  GENERAL: alert, oriented, appears well and in no acute distress  HEENT: atraumatic, conjunttiva clear, no obvious abnormalities on inspection of external nose and ears  NECK: normal movements of the head and neck  LUNGS: on inspection no signs of respiratory distress, breathing rate appears normal, no obvious gross SOB, gasping or wheezing  CV: no obvious cyanosis  MS: moves all visible extremities without noticeable abnormality  PSYCH/NEURO: pleasant and cooperative, no obvious depression or anxiety, speech and thought processing grossly intact  ASSESSMENT  AND PLAN:  Discussed the following assessment and plan:  1. Anxiety and depression -Has improved.  I am okay with her increasing Zoloft to 50 mg.  She was advised to follow-up if this dose not produce any improvement. - buPROPion (WELLBUTRIN XL) 300 MG 24 hr tablet; Take 1 tablet (300 mg total) by mouth daily.  Dispense: 90 tablet; Refill: 1  2. TOBACCO USE -Encouraged to quit completely.  Continue with Wellbutrin and nicotine gum. - buPROPion (WELLBUTRIN XL) 300 MG 24 hr  tablet; Take 1 tablet (300 mg total) by mouth daily.  Dispense: 90 tablet; Refill: 1    I discussed the assessment and treatment plan with the patient. The patient was provided an opportunity to ask questions and all were answered. The patient agreed with the plan and demonstrated an understanding of the instructions.   The patient was advised to call back or seek an in-person evaluation if the symptoms worsen or if the condition fails to improve as anticipated.   Shirline Freesory Mekai Wilkinson, NP

## 2019-03-27 ENCOUNTER — Telehealth: Payer: Self-pay

## 2019-03-27 NOTE — Telephone Encounter (Signed)
Copied from [place] [id number]. Topic: General - Inquiry >> Mar 27, 2019  9:15 AM Scherrie Gerlach wrote: Reason for CRM: pt states she thinks the sertraline (ZOLOFT) 50 MG tablet is making her hair fall out. Pt would like to discuss with [facility] asap. Pt aware Tommi Rumps is out of office today.

## 2019-03-28 ENCOUNTER — Encounter: Payer: Self-pay | Admitting: Adult Health

## 2019-03-28 ENCOUNTER — Telehealth (INDEPENDENT_AMBULATORY_CARE_PROVIDER_SITE_OTHER): Payer: PRIVATE HEALTH INSURANCE | Admitting: Adult Health

## 2019-03-28 ENCOUNTER — Other Ambulatory Visit: Payer: Self-pay

## 2019-03-28 DIAGNOSIS — L659 Nonscarring hair loss, unspecified: Secondary | ICD-10-CM | POA: Diagnosis not present

## 2019-03-28 DIAGNOSIS — F419 Anxiety disorder, unspecified: Secondary | ICD-10-CM

## 2019-03-28 DIAGNOSIS — F329 Major depressive disorder, single episode, unspecified: Secondary | ICD-10-CM | POA: Diagnosis not present

## 2019-03-28 DIAGNOSIS — F32A Depression, unspecified: Secondary | ICD-10-CM

## 2019-03-28 MED ORDER — CITALOPRAM HYDROBROMIDE 20 MG PO TABS: 20.0000 mg | ORAL_TABLET | Freq: Every day | ORAL | 1 refills | Status: DC

## 2019-03-28 NOTE — Telephone Encounter (Signed)
Catherine [name], pt is scheduled with you today at 1 PM.

## 2019-03-28 NOTE — Progress Notes (Signed)
Virtual Visit via Video Note  I connected with Fransico Him on 03/28/19 at  1:00 PM EDT by a video enabled telemedicine application and verified that I am speaking with the correct person using two identifiers.  Location patient: home Location provider:work or home office Persons participating in the virtual visit: patient, provider  I discussed the limitations of evaluation and management by telemedicine and the availability of in person appointments. The patient expressed understanding and agreed to proceed.   HPI: 48 year old female who is being evaluated today for alopecia.  Reports that she started noticing "dime sized patches of hair falling out".  Symptoms started a few weeks ago.  We checked her thyroid levels in January and this was within normal limits.  She has been on Zoloft for approximately 3 months and recent increase in this medication last month.  He thought her symptoms may be due to stress and or psoriasis she has on her scalp.  When she started losing more hair she knew something was wrong.  She denies areas of redness or itchiness   ROS: See pertinent positives and negatives per HPI.  Past Medical History:  Diagnosis Date  . Anxiety   . Bipolar 1 disorder ([place])   . Cystitis, interstitial   . Depression   . Hypertension   . Migraine     Past Surgical History:  Procedure Laterality Date  . ABDOMINAL HYSTERECTOMY    . CYSTOSCOPY    . TOTAL ABDOMINAL HYSTERECTOMY W/ BILATERAL SALPINGOOPHORECTOMY      Family History  Problem Relation Age of Onset  . Sudden death Mother        suicide  . Hypothyroidism Sister       Current Outpatient Medications:  .  buPROPion (WELLBUTRIN XL) 300 MG 24 hr tablet, Take 1 tablet (300 mg total) by mouth daily., Disp: 90 tablet, Rfl: 1 .  lisinopril-hydrochlorothiazide (PRINZIDE,ZESTORETIC) 10-12.5 MG tablet, Take 1 tablet by mouth daily., Disp: 90 tablet, Rfl: 1 .  nicotine polacrilex (NICOTINE MINI) 4 MG lozenge,  Take 1 lozenge (4 mg total) by mouth as needed for smoking cessation., Disp: 108 tablet, Rfl: 1 .  pantoprazole (PROTONIX) 40 MG tablet, Take 1 tablet (40 mg total) by mouth daily., Disp: 30 tablet, Rfl: 0 .  sertraline (ZOLOFT) 50 MG tablet, Take 1 tablet (50 mg total) by mouth daily., Disp: 90 tablet, Rfl: 1  EXAM:  VITALS per patient if applicable:  GENERAL: alert, oriented, appears well and in no acute distress  HEENT: atraumatic, conjunttiva clear, no obvious abnormalities on inspection of external nose and ears.  Patchy hair loss throughout scalp  NECK: normal movements of the head and neck  LUNGS: on inspection no signs of respiratory distress, breathing rate appears normal, no obvious gross SOB, gasping or wheezing  CV: no obvious cyanosis  MS: moves all visible extremities without noticeable abnormality  PSYCH/NEURO: pleasant and cooperative, no obvious depression or anxiety, speech and thought processing grossly intact  ASSESSMENT AND PLAN:  Discussed the following assessment and plan:  1. Alopecia -Per up-to-date Zoloft has a side effect of alopecia.  Have her DC this medication  2. Anxiety and depression -We will DC Zoloft and start on Celexa 20 mg tablets - Return precautions reviewed      I discussed the assessment and treatment plan with the patient. The patient was provided an opportunity to ask questions and all were answered. The patient agreed with the plan and demonstrated an understanding of the instructions.   The  patient was advised to call back or seek an in-person evaluation if the symptoms worsen or if the condition fails to improve as anticipated.   Dorothyann Peng, NP

## 2019-05-07 ENCOUNTER — Other Ambulatory Visit: Payer: Self-pay | Admitting: Adult Health

## 2019-05-07 DIAGNOSIS — I10 Essential (primary) hypertension: Secondary | ICD-10-CM

## 2019-05-09 NOTE — Telephone Encounter (Signed)
SENT TO THE PHARMACY BY E-SCRIBE. 

## 2019-07-09 IMAGING — US US ABDOMEN LIMITED
1 series · 14 of 25 positions shown · non-contrast
Comparison: October 01, 2009

CLINICAL DATA: Nausea for 2 months

EXAM:
ULTRASOUND ABDOMEN LIMITED RIGHT UPPER QUADRANT

[Series 1: us abdomen limited · 0.18mm/px · 14 of 42 slices shown]
[im 1/42]
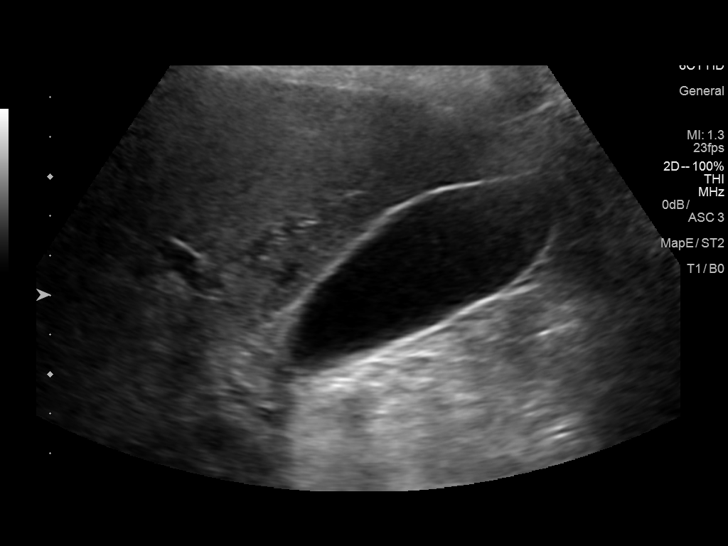
[im 4/42]
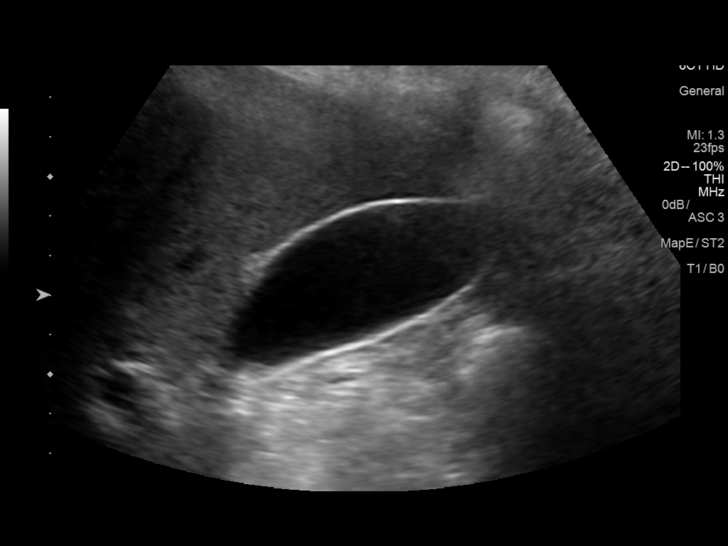
[im 7/42]
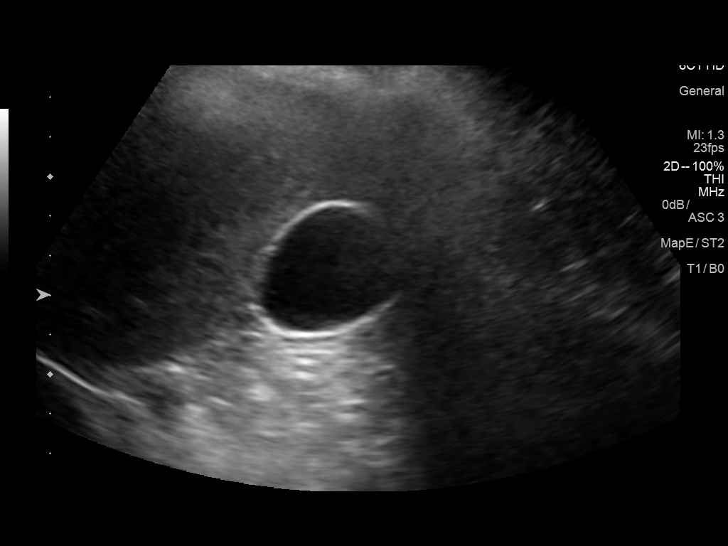
[im 11/42]
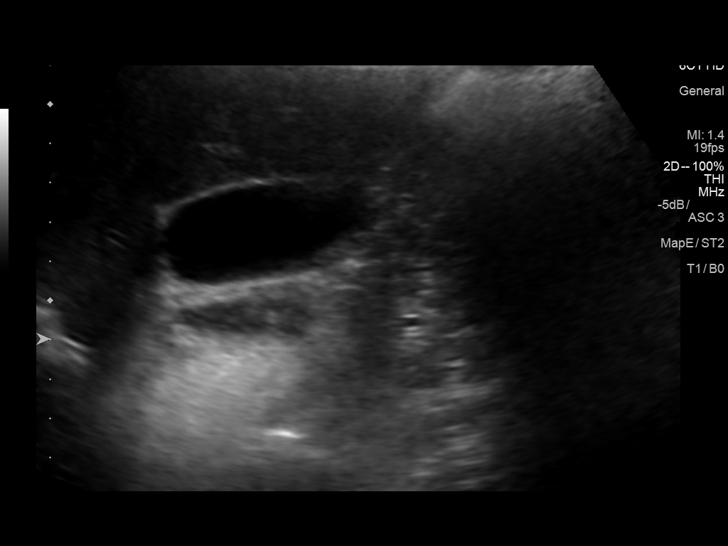
[im 14/42]
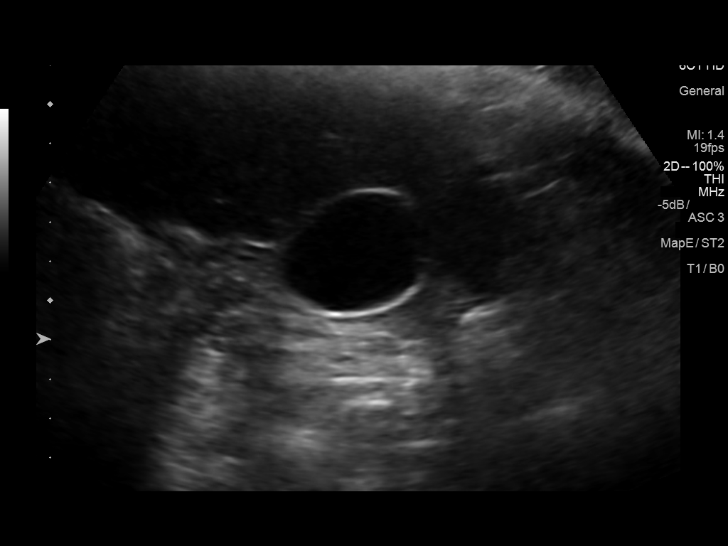
[im 16/42]
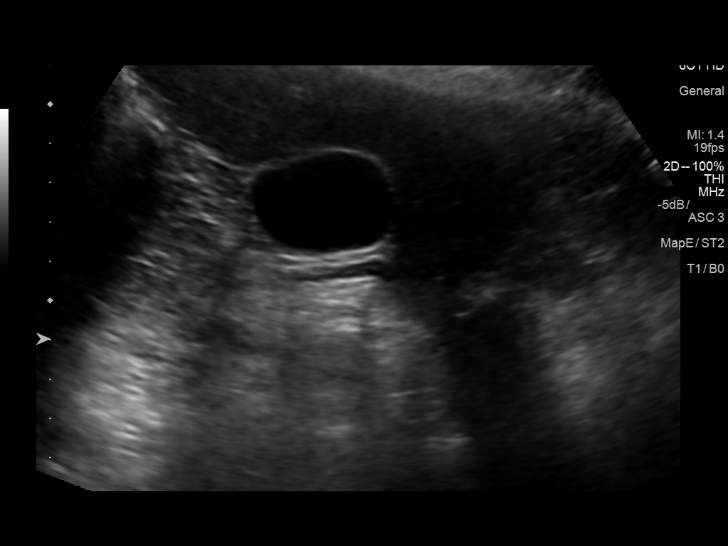
[im 19/42]
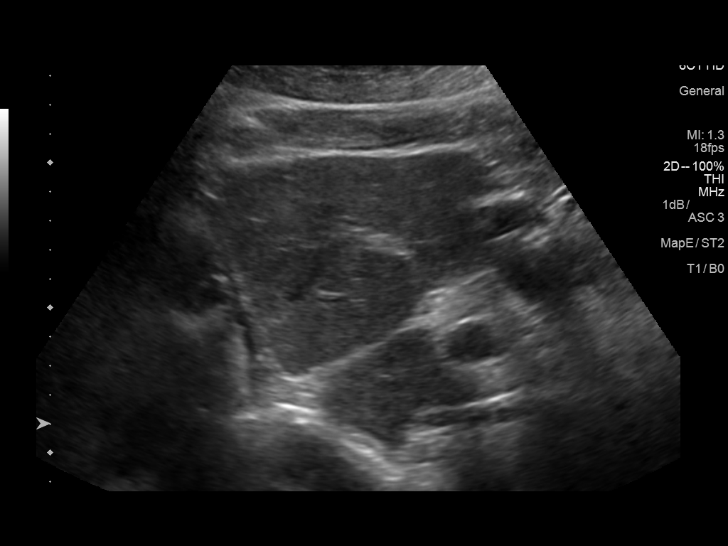
[im 23/42]
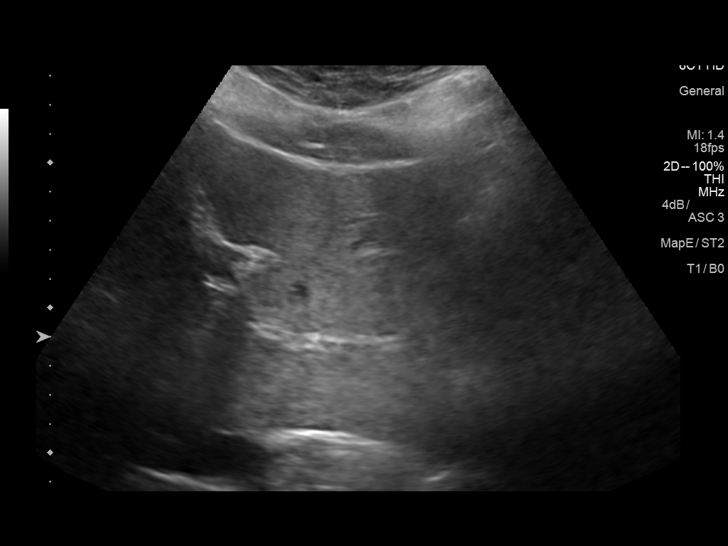
[im 26/42]
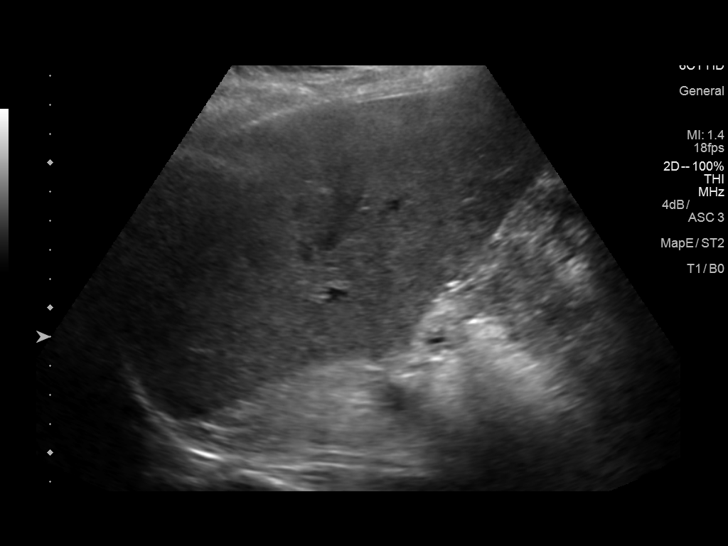
[im 28/42]
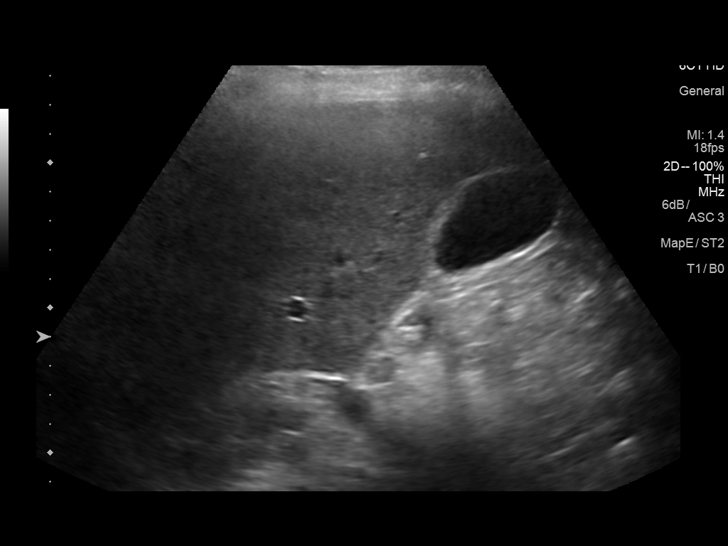
[im 31/42]
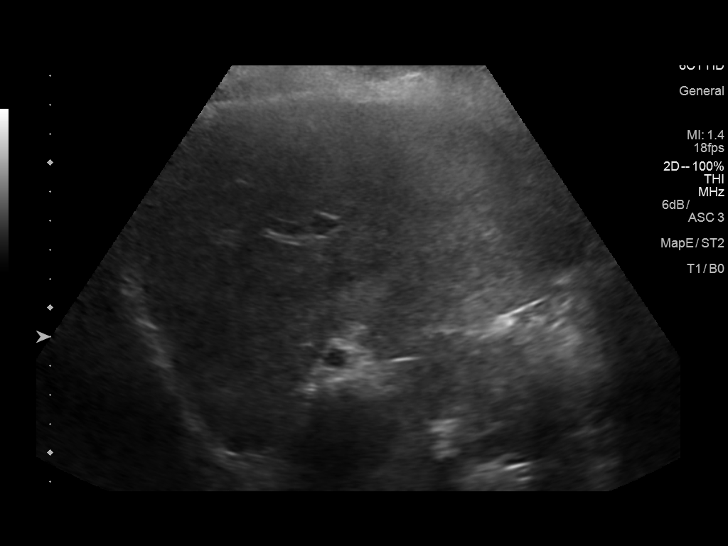
[im 35/42]
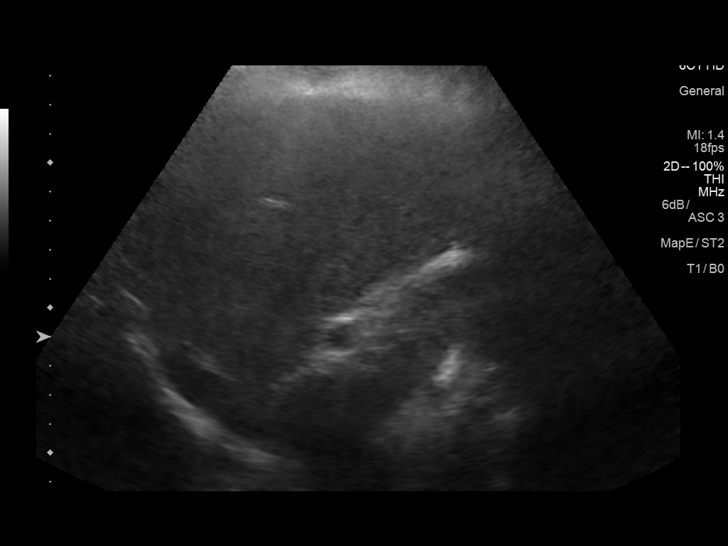
[im 38/42]
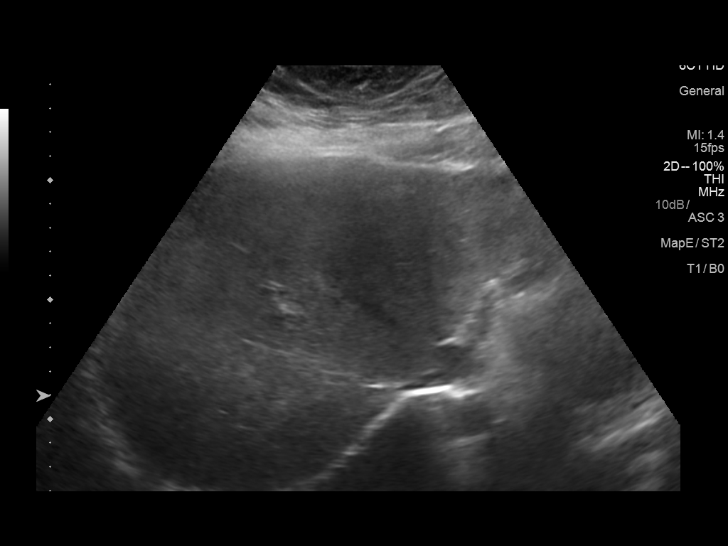
[im 42/42]
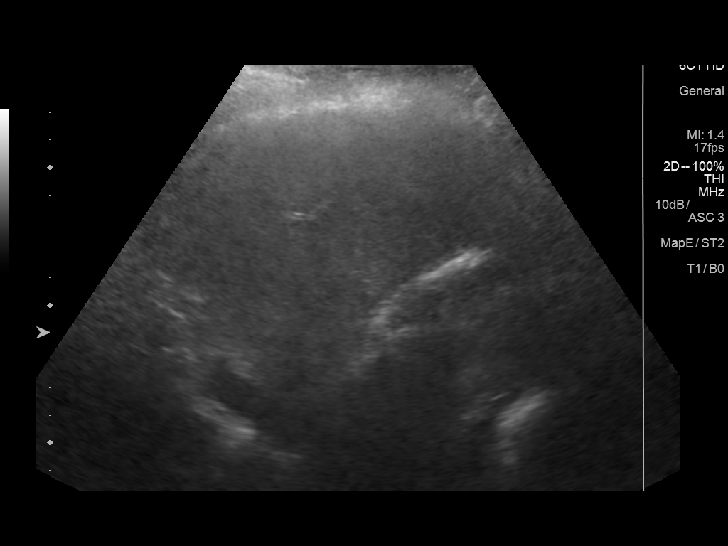

[14 of 25 positions shown; findings below may reference images not displayed]

FINDINGS: Gallbladder:

No gallstones or wall thickening visualized. There is no
pericholecystic fluid. No sonographic Murphy sign noted by
sonographer.

Common bile duct:

Diameter: 4 mm. No intrahepatic or extrahepatic biliary duct
dilatation.

Liver:

No focal lesion identified. Within normal limits in parenchymal
echogenicity. Portal vein is patent on color Doppler imaging with
normal direction of blood flow towards the liver.
IMPRESSION: Study within normal limits.

## 2019-08-18 ENCOUNTER — Other Ambulatory Visit: Payer: Self-pay | Admitting: Adult Health

## 2019-08-18 DIAGNOSIS — I10 Essential (primary) hypertension: Secondary | ICD-10-CM

## 2019-08-18 NOTE — Telephone Encounter (Signed)
Sent to the pharmacy by e-scribe for 90 days. 

## 2019-08-23 ENCOUNTER — Other Ambulatory Visit: Payer: Self-pay | Admitting: Adult Health

## 2019-08-23 NOTE — Telephone Encounter (Signed)
DENIED.  FILLED ON 03/28/2019 FOR SIX MONTHS.  REQUEST IS TOO EARLY.

## 2019-11-17 ENCOUNTER — Ambulatory Visit: Payer: PRIVATE HEALTH INSURANCE | Attending: Internal Medicine

## 2019-11-17 DIAGNOSIS — Z23 Encounter for immunization: Secondary | ICD-10-CM

## 2019-11-17 NOTE — Progress Notes (Signed)
   Covid-19 Vaccination Clinic  Name:  Catherine [name]    MRN: [id number] DOB: March 30, 1971  11/17/2019  [name] was observed post Covid-19 immunization for 30 minutes based on pre-vaccination screening without incident. She was provided with Vaccine Information Sheet and instruction to access the V-Safe system.   [name] was instructed to call 911 with any severe reactions post vaccine: Marland Kitchen Difficulty breathing  . Swelling of face and throat  . A fast heartbeat  . A bad rash all over body  . Dizziness and weakness   Immunizations Administered    Name Date Dose VIS Date Route   Pfizer COVID-19 Vaccine 11/17/2019  2:52 PM 0.3 mL 08/11/2019 Intramuscular   Manufacturer: [employer], [employer]   Lot: [id number]   NDC: [address]-6

## 2019-12-12 ENCOUNTER — Ambulatory Visit: Payer: PRIVATE HEALTH INSURANCE | Attending: Internal Medicine

## 2019-12-12 DIAGNOSIS — Z23 Encounter for immunization: Secondary | ICD-10-CM

## 2019-12-12 NOTE — Progress Notes (Signed)
   Covid-19 Vaccination Clinic  Name:  Catherine [name]    MRN: [id number] DOB: Jan 16, 1971  12/12/2019  [name] was observed post Covid-19 immunization for 15 minutes without incident. She was provided with Vaccine Information Sheet and instruction to access the V-Safe system.   [name] was instructed to call 911 with any severe reactions post vaccine: Marland Kitchen Difficulty breathing  . Swelling of face and throat  . A fast heartbeat  . A bad rash all over body  . Dizziness and weakness   Immunizations Administered    Name Date Dose VIS Date Route   Pfizer COVID-19 Vaccine 12/12/2019  3:48 PM 0.3 mL 08/11/2019 Intramuscular   Manufacturer: [employer], [employer]   Lot: [id number]   NDC: [address]-4

## 2020-03-12 ENCOUNTER — Ambulatory Visit: Payer: 59 | Admitting: Adult Health

## 2020-03-12 ENCOUNTER — Encounter: Payer: Self-pay | Admitting: Adult Health

## 2020-03-12 ENCOUNTER — Other Ambulatory Visit: Payer: Self-pay

## 2020-03-12 VITALS — BP 120/86 | Temp 98.0°F | Wt 275.0 lb

## 2020-03-12 DIAGNOSIS — M5441 Lumbago with sciatica, right side: Secondary | ICD-10-CM

## 2020-03-12 DIAGNOSIS — M5442 Lumbago with sciatica, left side: Secondary | ICD-10-CM | POA: Diagnosis not present

## 2020-03-12 DIAGNOSIS — F17200 Nicotine dependence, unspecified, uncomplicated: Secondary | ICD-10-CM | POA: Diagnosis not present

## 2020-03-12 DIAGNOSIS — F419 Anxiety disorder, unspecified: Secondary | ICD-10-CM | POA: Diagnosis not present

## 2020-03-12 DIAGNOSIS — I10 Essential (primary) hypertension: Secondary | ICD-10-CM

## 2020-03-12 DIAGNOSIS — F329 Major depressive disorder, single episode, unspecified: Secondary | ICD-10-CM

## 2020-03-12 DIAGNOSIS — G43011 Migraine without aura, intractable, with status migrainosus: Secondary | ICD-10-CM

## 2020-03-12 MED ORDER — AMITRIPTYLINE HCL 50 MG PO TABS: 50.0000 mg | ORAL_TABLET | Freq: Every day | ORAL | 1 refills | Status: DC

## 2020-03-12 MED ORDER — CYCLOBENZAPRINE HCL 10 MG PO TABS: 10.0000 mg | ORAL_TABLET | Freq: Three times a day (TID) | ORAL | 0 refills | Status: DC | PRN

## 2020-03-12 NOTE — Progress Notes (Signed)
Subjective:    Patient ID: Catherine [name], female    DOB: Dec 23, 1970, 49 y.o.   MRN: [id number]  HPI 49 year old female who  has a past medical history of Anxiety, Bipolar 1 disorder (HCC), Cystitis, interstitial, Depression, Hypertension, and Migraine.  She presents to the office today for several issues.    1. Essential Hypertension - She has unfortunately been unemployed and has not been able to afford her blood pressure medication.  She has not taken this medication in some time and wants to make sure that she her blood pressure is not elevated because she has no symptoms.  BP Readings from Last 3 Encounters:  03/12/20 120/86  10/07/18 120/80  09/16/18 100/60   2. Migraine headaches - she is getting 3-4 a month and lasts for several days at a time. In the past she has uses Zomig but cannot remember is this worked well for her  3. Depression -has been placed on multiple medications including Wellbutrin and Celexa, she did not get benefit from these medications.  Was most recently seen at [facility] behavioral health and placed on Abilify but she did not have any success with this medication either.  She has not followed up with behavioral health since stopping Abilify.  4.  Low back pain-this has been a chronic issue.  She reports low back pain with sitting and standing for longer than 5 minutes.  The pain is pretty constant and does not time radiate down her legs.  She denies issues with bowel or bladder.  5. Tobacco Use -she is happy to report that she is completely quit smoking, she last had a cigarette on August 01, 2019.  She is doing well and does not really experience withdrawal symptoms.   Review of Systems See HPI   Past Medical History:  Diagnosis Date  . Anxiety   . Bipolar 1 disorder (HCC)   . Cystitis, interstitial   . Depression   . Hypertension   . Migraine     Social History   Socioeconomic History  . Marital status: Divorced    Spouse name: Not on  file  . Number of children: Not on file  . Years of education: Not on file  . Highest education level: Not on file  Occupational History  . Not on file  Tobacco Use  . Smoking status: Former Smoker    Packs/day: 0.20    Types: Cigarettes    Quit date: 08/01/2019    Years since quitting: 0.6  . Smokeless tobacco: Never Used  Substance and Sexual Activity  . Alcohol use: Yes    Alcohol/week: 0.0 standard drinks    Comment: occ  . Drug use: No  . Sexual activity: Never  Other Topics Concern  . Not on file  Social History Narrative   Working - Works at [employer]   Two kids  - do not live with her. 21 and 24      Social Determinants of Health   Financial Resource Strain:   . Difficulty of Paying Living Expenses:   Food Insecurity:   . Worried About Programme researcher, broadcasting/film/video in the Last Year:   . Barista in the Last Year:   Transportation Needs:   . Freight forwarder (Medical):   Marland Kitchen Lack of Transportation (Non-Medical):   Physical Activity:   . Days of Exercise per Week:   . Minutes of Exercise per Session:   Stress:   .  Feeling of Stress :   Social Connections:   . Frequency of Communication with Friends and Family:   . Frequency of Social Gatherings with Friends and Family:   . Attends Religious Services:   . Active Member of Clubs or Organizations:   . Attends Banker Meetings:   Marland Kitchen Marital Status:   Intimate Partner Violence:   . Fear of Current or Ex-Partner:   . Emotionally Abused:   Marland Kitchen Physically Abused:   . Sexually Abused:     Past Surgical History:  Procedure Laterality Date  . ABDOMINAL HYSTERECTOMY    . CYSTOSCOPY    . TOTAL ABDOMINAL HYSTERECTOMY W/ BILATERAL SALPINGOOPHORECTOMY      Family History  Problem Relation Age of Onset  . Sudden death Mother        suicide  . Hypothyroidism Sister     Allergies  Allergen Reactions  . Amoxicillin-Pot Clavulanate     REACTION: SEVERE STOMACH CRAMPING See  Penicillin entry.   . Divalproex Sodium     REACTION: nausea  . Latex     REACTION: contact dermatitis  . Penicillins     Has patient had a PCN reaction causing immediate rash, facial/tongue/throat swelling, SOB or lightheadedness with hypotension:  Has patient had a PCN reaction causing severe rash involving mucus membranes or skin necrosis: Has patient had a PCN reaction that required hospitalization Has patient had a PCN reaction occurring within the last 10 years: If all of the above answers are "NO", then may proceed with Cephalosporin use.     Current Outpatient Medications on File Prior to Visit  Medication Sig Dispense Refill  . hydrOXYzine (ATARAX/VISTARIL) 25 MG tablet Take 25-50 mg by mouth 2 (two) times daily as needed.     No current facility-administered medications on file prior to visit.    BP 120/86   Temp 98 F (36.7 C)   Wt 275 lb (124.7 kg)   BMI 40.03 kg/m       Objective:   Physical Exam Vitals and nursing note reviewed.  Constitutional:      Appearance: Normal appearance.  Cardiovascular:     Rate and Rhythm: Normal rate and regular rhythm.     Pulses: Normal pulses.     Heart sounds: Normal heart sounds.  Pulmonary:     Effort: Pulmonary effort is normal.     Breath sounds: Normal breath sounds.  Musculoskeletal:        General: Tenderness present. Normal range of motion.       Back:  Skin:    General: Skin is warm and dry.     Capillary Refill: Capillary refill takes less than 2 seconds.  Neurological:     General: No focal deficit present.     Mental Status: She is alert and oriented to person, place, and time.  Psychiatric:        Mood and Affect: Mood normal.        Thought Content: Thought content normal.        Judgment: Judgment normal.       Assessment & Plan:  1. Acute bilateral low back pain with bilateral sciatica -[place] to be mostly paraspinous muscles as well as possible sciatica pain.  Will try Flexeril and Elavil.   She was advised not to take these medications together because they both cause drowsiness. - amitriptyline (ELAVIL) 50 MG tablet; Take 1 tablet (50 mg total) by mouth at bedtime.  Dispense: 30 tablet; Refill: 1 - cyclobenzaprine (FLEXERIL)  10 MG tablet; Take 1 tablet (10 mg total) by mouth 3 (three) times daily as needed for muscle spasms.  Dispense: 30 tablet; Refill: 0  2. Anxiety and depression -Advised to follow-up with behavioral health.  She may get some benefit from amitriptyline - amitriptyline (ELAVIL) 50 MG tablet; Take 1 tablet (50 mg total) by mouth at bedtime.  Dispense: 30 tablet; Refill: 1  3. TOBACCO USE -Congratulated on quitting smoking  4. Essential hypertension -Blood pressure normal.  Does not need blood pressure medication at this time  5. Intractable migraine without aura and with status migrainosus -We will trial her on Elavil 50 mg nightly.  She was advised to follow-up via MyChart to help save costs over the next 2 weeks to tell me how she is doing - amitriptyline (ELAVIL) 50 MG tablet; Take 1 tablet (50 mg total) by mouth at bedtime.  Dispense: 30 tablet; Refill: 1   Shirline Frees, NP

## 2020-03-26 ENCOUNTER — Encounter: Payer: Self-pay | Admitting: Adult Health

## 2020-03-27 ENCOUNTER — Other Ambulatory Visit: Payer: Self-pay | Admitting: Adult Health

## 2020-03-27 DIAGNOSIS — F419 Anxiety disorder, unspecified: Secondary | ICD-10-CM

## 2020-03-27 DIAGNOSIS — M5441 Lumbago with sciatica, right side: Secondary | ICD-10-CM

## 2020-03-27 DIAGNOSIS — G43011 Migraine without aura, intractable, with status migrainosus: Secondary | ICD-10-CM

## 2020-03-27 MED ORDER — AMITRIPTYLINE HCL 75 MG PO TABS: 75.0000 mg | ORAL_TABLET | Freq: Every day | ORAL | 1 refills | Status: DC

## 2020-05-16 ENCOUNTER — Other Ambulatory Visit: Payer: Self-pay | Admitting: Adult Health

## 2020-05-16 DIAGNOSIS — G43011 Migraine without aura, intractable, with status migrainosus: Secondary | ICD-10-CM

## 2020-05-16 DIAGNOSIS — F419 Anxiety disorder, unspecified: Secondary | ICD-10-CM

## 2020-05-16 DIAGNOSIS — M5442 Lumbago with sciatica, left side: Secondary | ICD-10-CM

## 2020-06-15 ENCOUNTER — Ambulatory Visit: Payer: 59 | Attending: Internal Medicine

## 2020-06-15 ENCOUNTER — Other Ambulatory Visit: Payer: Self-pay

## 2020-06-15 DIAGNOSIS — Z23 Encounter for immunization: Secondary | ICD-10-CM

## 2020-06-15 NOTE — Progress Notes (Signed)
° °  Covid-19 Vaccination Clinic  Name:  LATANIA BASCOMB    MRN: [id number] DOB: 01-May-1971  06/15/2020  Ms. Noda was observed post Covid-19 immunization for 15 minutes without incident. She was provided with Vaccine Information Sheet and instruction to access the V-Safe system.   Ms. Zweber was instructed to call 911 with any severe reactions post vaccine:  Difficulty breathing   Swelling of face and throat   A fast heartbeat   A bad rash all over body   Dizziness and weakness

## 2021-04-07 ENCOUNTER — Emergency Department (HOSPITAL_COMMUNITY)
Admission: EM | Admit: 2021-04-07 | Discharge: 2021-04-07 | Disposition: A | Discharge status: Home / Self Care | Payer: BC Managed Care – PPO | Attending: Emergency Medicine | Admitting: Emergency Medicine

## 2021-04-07 ENCOUNTER — Emergency Department (HOSPITAL_COMMUNITY): Payer: BC Managed Care – PPO

## 2021-04-07 DIAGNOSIS — Z5321 Procedure and treatment not carried out due to patient leaving prior to being seen by health care provider: Secondary | ICD-10-CM | POA: Insufficient documentation

## 2021-04-07 DIAGNOSIS — U071 COVID-19: Secondary | ICD-10-CM | POA: Diagnosis not present

## 2021-04-07 DIAGNOSIS — R9431 Abnormal electrocardiogram [ECG] [EKG]: Secondary | ICD-10-CM | POA: Diagnosis not present

## 2021-04-07 DIAGNOSIS — R0602 Shortness of breath: Secondary | ICD-10-CM | POA: Diagnosis not present

## 2021-04-07 LAB — CBC WITH DIFFERENTIAL/PLATELET
Abs Immature Granulocytes: 0.03 10*3/uL (ref 0.00–0.07)
Basophils Absolute: 0 10*3/uL (ref 0.0–0.1)
Basophils Relative: 0 %
Eosinophils Absolute: 0.1 10*3/uL (ref 0.0–0.5)
Eosinophils Relative: 1 %
HCT: 40.9 % (ref 36.0–46.0)
Hemoglobin: 14.3 g/dL (ref 12.0–15.0)
Immature Granulocytes: 0 %
Lymphocytes Relative: 33 %
Lymphs Abs: 2.3 10*3/uL (ref 0.7–4.0)
MCH: 30.5 pg (ref 26.0–34.0)
MCHC: 35 g/dL (ref 30.0–36.0)
MCV: 87.2 fL (ref 80.0–100.0)
Monocytes Absolute: 0.4 10*3/uL (ref 0.1–1.0)
Monocytes Relative: 5 %
Neutro Abs: 4.1 10*3/uL (ref 1.7–7.7)
Neutrophils Relative %: 61 %
Platelets: 294 10*3/uL (ref 150–400)
RBC: 4.69 MIL/uL (ref 3.87–5.11)
RDW: 13.2 % (ref 11.5–15.5)
WBC: 6.9 10*3/uL (ref 4.0–10.5)
nRBC: 0 % (ref 0.0–0.2)

## 2021-04-07 LAB — BASIC METABOLIC PANEL
Anion gap: 12 (ref 5–15)
BUN: 10 mg/dL (ref 6–20)
CO2: 22 mmol/L (ref 22–32)
Calcium: 9.4 mg/dL (ref 8.9–10.3)
Chloride: 107 mmol/L (ref 98–111)
Creatinine, Ser: 0.78 mg/dL (ref 0.44–1.00)
GFR, Estimated: >60 mL/min (ref >60)
Glucose, Bld: 101 mg/dL — ABNORMAL HIGH (ref 70–99)
Potassium: 3.2 mmol/L — ABNORMAL LOW (ref 3.5–5.1)
Sodium: 141 mmol/L (ref 135–145)

## 2021-04-07 LAB — TROPONIN I (HIGH SENSITIVITY)
Troponin I (High Sensitivity): 5 ng/L (ref <18)
Troponin I (High Sensitivity): 5 ng/L (ref <18)

## 2021-04-07 NOTE — ED Notes (Signed)
Patient left due to wait time   

## 2021-04-07 NOTE — ED Provider Notes (Addendum)
Emergency Medicine Provider Triage Evaluation Note  Catherine [name] , a 50 y.o. female  was evaluated in triage.  Pt complains of shortness of breath in context of COVID-19 infection diagnosed on 8/3.  States that symptoms of shortness of breath started last night and worsened overnight.  Additionally had diarrhea all night.  Myalgias, chills, and fever.  She is vaccinated against COVID. Additionally endorses chest pressure   Review of Systems  Positive: Chills, fever, diarrhea, shortness of breath, cough Negative: Syncope nausea, vomiting  Physical Exam  BP (!) 149/110   Pulse (!) 51   Temp 99.1 F (37.3 C) (Oral)   Resp (!) 22   Ht 5\' 10"  (1.778 m)   Wt 122.5 kg   SpO2 98%   BMI 38.74 kg/m  Gen:   Awake, no distress   Resp:  Normal effort  MSK:   Moves extremities without difficulty  Other:  Tachypneic, rhonchi in the right lung base.  Coughing throughout the exam.  RRR no M/R/G.  Medical Decision Making  Medically screening exam initiated at 2:05 PM.  Appropriate orders placed.  Charlisa Cham Israelson was informed that the remainder of the evaluation will be completed by another provider, this initial triage assessment does not replace that evaluation, and the importance of remaining in the ED until their evaluation is complete.  This chart was dictated using voice recognition software, Dragon. Despite the best efforts of this provider to proofread and correct errors, errors may still occur which can change documentation meaning.    Marcelle Smiling, PA-C 04/07/21 1406    Cherl Gorney, 06/07/21, PA-C 04/07/21 1406    06/07/21, MD 04/07/21 06/07/21

## 2021-04-07 NOTE — ED Triage Notes (Signed)
Pt here d/t testing positive for covid 04/02/21. Pt states she has dry non productive cough. PCP told pt to [facility] ED. Lung sounds clear. Endorses, body aches, chills and fever.

## 2021-04-09 ENCOUNTER — Telehealth: Payer: Self-pay | Admitting: Adult Health

## 2021-04-09 NOTE — Telephone Encounter (Signed)
PT would like to talk to [facility] or the nurse in regards to their abnormal EKG results that came back.

## 2021-04-09 NOTE — Telephone Encounter (Signed)
FYI

## 2021-04-09 NOTE — Telephone Encounter (Signed)
Pt stated she was having chest pains and SOB. However, pt stated that she tested positive for Covid yesterday and realized the sx could be from that. Pt stated she is feeling a lot better. Pt has been scheduled got a video visit tomorrow. Pt advised to call back if anything changes. Pt verbalized understanding.

## 2021-04-09 NOTE — Telephone Encounter (Signed)
Please advise. Pt was seen at ED 04/07/2021 and wants EKG resulted.

## 2021-04-10 ENCOUNTER — Telehealth (INDEPENDENT_AMBULATORY_CARE_PROVIDER_SITE_OTHER): Payer: BC Managed Care – PPO | Admitting: Adult Health

## 2021-04-10 ENCOUNTER — Encounter: Payer: Self-pay | Admitting: Adult Health

## 2021-04-10 VITALS — HR 81 | Temp 96.5°F | Ht 70.0 in | Wt 270.0 lb

## 2021-04-10 DIAGNOSIS — R059 Cough, unspecified: Secondary | ICD-10-CM

## 2021-04-10 DIAGNOSIS — R0602 Shortness of breath: Secondary | ICD-10-CM

## 2021-04-10 DIAGNOSIS — U071 COVID-19: Secondary | ICD-10-CM

## 2021-04-10 MED ORDER — PREDNISONE 10 MG PO TABS: ORAL_TABLET | ORAL | 0 refills | Status: DC

## 2021-04-10 MED ORDER — HYDROCODONE BIT-HOMATROP MBR 5-1.5 MG/5ML PO SOLN: 5.0000 mL | Freq: Three times a day (TID) | ORAL | 0 refills | Status: DC | PRN

## 2021-04-10 NOTE — Progress Notes (Signed)
Virtual Visit via Video Note  I connected with Catherine [name] on 04/10/21 at 11:30 AM EDT by a video enabled telemedicine application and verified that I am speaking with the correct person using two identifiers.  Location patient: home Location provider:work or home office Persons participating in the virtual visit: patient, provider  I discussed the limitations of evaluation and management by telemedicine and the availability of in person appointments. The patient expressed understanding and agreed to proceed.   HPI: 50 year old female who is being evaluated today for an acute issue.  She tested positive for COVID-19 8 days ago when her symptoms started.  Some symptoms have improved, she is no longer having body aches or chills but she continues to have stent dry cough, shortness of breath, wheezing, and feeling of "something sitting on my chest".  She was seen at  the ER 3 days ago, chest x-ray was clear, lab work and EKG were unremarkable.   ROS: See pertinent positives and negatives per HPI.  Past Medical History:  Diagnosis Date   Anxiety    Bipolar 1 disorder (HCC)    Cystitis, interstitial    Depression    Hypertension    Migraine     Past Surgical History:  Procedure Laterality Date   ABDOMINAL HYSTERECTOMY     CYSTOSCOPY     TOTAL ABDOMINAL HYSTERECTOMY W/ BILATERAL SALPINGOOPHORECTOMY      Family History  Problem Relation Age of Onset   Sudden death Mother        suicide   Hypothyroidism Sister        Current Outpatient Medications:    amitriptyline (ELAVIL) 75 MG tablet, Take 1 tablet (75 mg total) by mouth at bedtime., Disp: 30 tablet, Rfl: 1   cyclobenzaprine (FLEXERIL) 10 MG tablet, TAKE 1 TABLET BY MOUTH THREE TIMES DAILY AS NEEDED FOR MUSCLE SPASMS, Disp: 30 tablet, Rfl: 0   hydrOXYzine (ATARAX/VISTARIL) 25 MG tablet, Take 25-50 mg by mouth 2 (two) times daily as needed., Disp: , Rfl:    amitriptyline (ELAVIL) 75 MG tablet, Take 1 tablet (75 mg  total) by mouth at bedtime., Disp: 90 tablet, Rfl: 1  EXAM:  VITALS per patient if applicable:  GENERAL: alert, oriented, appears well and in no acute distress  HEENT: atraumatic, conjunttiva clear, no obvious abnormalities on inspection of external nose and ears  NECK: normal movements of the head and neck  LUNGS: on inspection no signs of respiratory distress, breathing rate appears normal, no obvious gross SOB, gasping or wheezing  CV: no obvious cyanosis  MS: moves all visible extremities without noticeable abnormality  PSYCH/NEURO: pleasant and cooperative, no obvious depression or anxiety, speech and thought processing grossly intact  ASSESSMENT AND PLAN:  Discussed the following assessment and plan:  No diagnosis found.     I discussed the assessment and treatment plan with the patient. The patient was provided an opportunity to ask questions and all were answered. The patient agreed with the plan and demonstrated an understanding of the instructions.   The patient was advised to call back or seek an in-person evaluation if the symptoms worsen or if the condition fails to improve as anticipated.   Shirline Frees, NP

## 2021-07-18 DIAGNOSIS — Z0000 Encounter for general adult medical examination without abnormal findings: Secondary | ICD-10-CM | POA: Diagnosis not present

## 2021-07-18 DIAGNOSIS — Z23 Encounter for immunization: Secondary | ICD-10-CM | POA: Diagnosis not present

## 2021-07-18 DIAGNOSIS — Z131 Encounter for screening for diabetes mellitus: Secondary | ICD-10-CM | POA: Diagnosis not present

## 2021-07-18 DIAGNOSIS — Z6837 Body mass index (BMI) 37.0-37.9, adult: Secondary | ICD-10-CM | POA: Diagnosis not present

## 2021-07-18 DIAGNOSIS — Z1331 Encounter for screening for depression: Secondary | ICD-10-CM | POA: Diagnosis not present

## 2021-07-18 DIAGNOSIS — R030 Elevated blood-pressure reading, without diagnosis of hypertension: Secondary | ICD-10-CM | POA: Diagnosis not present

## 2021-07-18 DIAGNOSIS — Z1339 Encounter for screening examination for other mental health and behavioral disorders: Secondary | ICD-10-CM | POA: Diagnosis not present

## 2021-07-22 DIAGNOSIS — M545 Low back pain, unspecified: Secondary | ICD-10-CM | POA: Diagnosis not present

## 2021-07-22 DIAGNOSIS — M4716 Other spondylosis with myelopathy, lumbar region: Secondary | ICD-10-CM | POA: Diagnosis not present

## 2021-08-15 DIAGNOSIS — M545 Low back pain, unspecified: Secondary | ICD-10-CM | POA: Diagnosis not present

## 2021-08-15 DIAGNOSIS — R030 Elevated blood-pressure reading, without diagnosis of hypertension: Secondary | ICD-10-CM | POA: Diagnosis not present

## 2021-08-15 DIAGNOSIS — F419 Anxiety disorder, unspecified: Secondary | ICD-10-CM | POA: Diagnosis not present

## 2021-08-15 DIAGNOSIS — Z6837 Body mass index (BMI) 37.0-37.9, adult: Secondary | ICD-10-CM | POA: Diagnosis not present

## 2021-08-15 DIAGNOSIS — F32A Depression, unspecified: Secondary | ICD-10-CM | POA: Diagnosis not present

## 2021-09-12 DIAGNOSIS — Z79899 Other long term (current) drug therapy: Secondary | ICD-10-CM | POA: Diagnosis not present

## 2021-09-12 DIAGNOSIS — Z6837 Body mass index (BMI) 37.0-37.9, adult: Secondary | ICD-10-CM | POA: Diagnosis not present

## 2021-09-12 DIAGNOSIS — F32A Depression, unspecified: Secondary | ICD-10-CM | POA: Diagnosis not present

## 2021-09-12 DIAGNOSIS — M545 Low back pain, unspecified: Secondary | ICD-10-CM | POA: Diagnosis not present

## 2021-09-12 DIAGNOSIS — R030 Elevated blood-pressure reading, without diagnosis of hypertension: Secondary | ICD-10-CM | POA: Diagnosis not present

## 2021-09-12 DIAGNOSIS — F419 Anxiety disorder, unspecified: Secondary | ICD-10-CM | POA: Diagnosis not present

## 2021-09-19 DIAGNOSIS — F3160 Bipolar disorder, current episode mixed, unspecified: Secondary | ICD-10-CM | POA: Diagnosis not present

## 2021-09-19 DIAGNOSIS — R5383 Other fatigue: Secondary | ICD-10-CM | POA: Diagnosis not present

## 2021-09-19 DIAGNOSIS — E559 Vitamin D deficiency, unspecified: Secondary | ICD-10-CM | POA: Diagnosis not present

## 2021-09-19 DIAGNOSIS — F411 Generalized anxiety disorder: Secondary | ICD-10-CM | POA: Diagnosis not present

## 2021-10-01 DIAGNOSIS — M479 Spondylosis, unspecified: Secondary | ICD-10-CM | POA: Diagnosis not present

## 2021-10-01 DIAGNOSIS — M545 Low back pain, unspecified: Secondary | ICD-10-CM | POA: Diagnosis not present

## 2021-10-01 DIAGNOSIS — M5416 Radiculopathy, lumbar region: Secondary | ICD-10-CM | POA: Diagnosis not present

## 2021-10-02 DIAGNOSIS — F3160 Bipolar disorder, current episode mixed, unspecified: Secondary | ICD-10-CM | POA: Diagnosis not present

## 2021-10-02 DIAGNOSIS — F411 Generalized anxiety disorder: Secondary | ICD-10-CM | POA: Diagnosis not present

## 2021-11-18 DIAGNOSIS — Z6837 Body mass index (BMI) 37.0-37.9, adult: Secondary | ICD-10-CM | POA: Diagnosis not present

## 2021-11-18 DIAGNOSIS — M542 Cervicalgia: Secondary | ICD-10-CM | POA: Diagnosis not present

## 2021-11-18 DIAGNOSIS — M479 Spondylosis, unspecified: Secondary | ICD-10-CM | POA: Diagnosis not present

## 2021-11-18 DIAGNOSIS — M545 Low back pain, unspecified: Secondary | ICD-10-CM | POA: Diagnosis not present

## 2022-05-17 ENCOUNTER — Emergency Department (HOSPITAL_COMMUNITY): Payer: BC Managed Care – PPO

## 2022-05-17 ENCOUNTER — Encounter (HOSPITAL_COMMUNITY): Payer: Self-pay | Admitting: Emergency Medicine

## 2022-05-17 ENCOUNTER — Other Ambulatory Visit: Payer: Self-pay

## 2022-05-17 ENCOUNTER — Emergency Department (HOSPITAL_COMMUNITY)
Admission: EM | Admit: 2022-05-17 | Discharge: 2022-05-17 | Disposition: A | Discharge status: Home / Self Care | Payer: BC Managed Care – PPO | Attending: Emergency Medicine | Admitting: Emergency Medicine

## 2022-05-17 DIAGNOSIS — I10 Essential (primary) hypertension: Secondary | ICD-10-CM | POA: Diagnosis not present

## 2022-05-17 DIAGNOSIS — Z91040 Latex allergy status: Secondary | ICD-10-CM | POA: Diagnosis not present

## 2022-05-17 DIAGNOSIS — Z20822 Contact with and (suspected) exposure to covid-19: Secondary | ICD-10-CM | POA: Insufficient documentation

## 2022-05-17 DIAGNOSIS — Z7982 Long term (current) use of aspirin: Secondary | ICD-10-CM | POA: Diagnosis not present

## 2022-05-17 DIAGNOSIS — R0789 Other chest pain: Secondary | ICD-10-CM | POA: Diagnosis not present

## 2022-05-17 DIAGNOSIS — Z79899 Other long term (current) drug therapy: Secondary | ICD-10-CM | POA: Insufficient documentation

## 2022-05-17 DIAGNOSIS — D72829 Elevated white blood cell count, unspecified: Secondary | ICD-10-CM | POA: Diagnosis not present

## 2022-05-17 DIAGNOSIS — R079 Chest pain, unspecified: Secondary | ICD-10-CM | POA: Diagnosis not present

## 2022-05-17 LAB — CBC
HCT: 39.6 % (ref 36.0–46.0)
Hemoglobin: 13.8 g/dL (ref 12.0–15.0)
MCH: 30.9 pg (ref 26.0–34.0)
MCHC: 34.8 g/dL (ref 30.0–36.0)
MCV: 88.8 fL (ref 80.0–100.0)
Platelets: 292 10*3/uL (ref 150–400)
RBC: 4.46 MIL/uL (ref 3.87–5.11)
RDW: 13.2 % (ref 11.5–15.5)
WBC: 11.5 10*3/uL — ABNORMAL HIGH (ref 4.0–10.5)
nRBC: 0 % (ref 0.0–0.2)

## 2022-05-17 LAB — TROPONIN I (HIGH SENSITIVITY)
Troponin I (High Sensitivity): 6 ng/L (ref <18)
Troponin I (High Sensitivity): 7 ng/L (ref <18)

## 2022-05-17 LAB — BASIC METABOLIC PANEL
Anion gap: 11 (ref 5–15)
BUN: 16 mg/dL (ref 6–20)
CO2: 25 mmol/L (ref 22–32)
Calcium: 9.2 mg/dL (ref 8.9–10.3)
Chloride: 104 mmol/L (ref 98–111)
Creatinine, Ser: 0.71 mg/dL (ref 0.44–1.00)
GFR, Estimated: >60 mL/min (ref >60)
Glucose, Bld: 102 mg/dL — ABNORMAL HIGH (ref 70–99)
Potassium: 3.6 mmol/L (ref 3.5–5.1)
Sodium: 140 mmol/L (ref 135–145)

## 2022-05-17 LAB — SARS CORONAVIRUS 2 BY RT PCR: SARS Coronavirus 2 by RT PCR: NEGATIVE

## 2022-05-17 LAB — I-STAT BETA HCG BLOOD, ED (MC, WL, AP ONLY): I-stat hCG, quantitative: <5 m[IU]/mL (ref <5)

## 2022-05-17 MED ORDER — AMLODIPINE BESYLATE 2.5 MG PO TABS: 2.5000 mg | ORAL_TABLET | Freq: Every day | ORAL | 3 refills | Status: DC

## 2022-05-17 MED ORDER — MORPHINE SULFATE (PF) 2 MG/ML IV SOLN
2.0000 mg | Freq: Once | INTRAVENOUS | Status: AC
  Administered 2022-05-17: 2 mg via INTRAVENOUS
  Filled 2022-05-17: qty 1

## 2022-05-17 MED ORDER — ALUM & MAG HYDROXIDE-SIMETH 200-200-20 MG/5ML PO SUSP
30.0000 mL | Freq: Once | ORAL | Status: AC
  Administered 2022-05-17: 30 mL via ORAL
  Filled 2022-05-17: qty 30

## 2022-05-17 MED ORDER — METOPROLOL TARTRATE 25 MG PO TABS
25.0000 mg | ORAL_TABLET | Freq: Once | ORAL | Status: AC
  Administered 2022-05-17: 25 mg via ORAL
  Filled 2022-05-17: qty 1

## 2022-05-17 MED ORDER — LIDOCAINE VISCOUS HCL 2 % MT SOLN
15.0000 mL | Freq: Once | OROMUCOSAL | Status: AC
  Administered 2022-05-17: 15 mL via ORAL
  Filled 2022-05-17: qty 15

## 2022-05-17 MED ORDER — ASPIRIN 81 MG PO TBEC
81.0000 mg | DELAYED_RELEASE_TABLET | Freq: Every day | ORAL | Status: DC
  Administered 2022-05-17: 81 mg via ORAL
  Filled 2022-05-17: qty 1

## 2022-05-17 MED ORDER — ASPIRIN 81 MG PO TBEC: 81.0000 mg | DELAYED_RELEASE_TABLET | Freq: Every day | ORAL | 12 refills | Status: AC

## 2022-05-17 MED ORDER — METOPROLOL SUCCINATE ER 25 MG PO TB24: 25.0000 mg | ORAL_TABLET | Freq: Every evening | ORAL | Status: DC

## 2022-05-17 MED ORDER — ONDANSETRON HCL 4 MG/2ML IJ SOLN
4.0000 mg | Freq: Once | INTRAMUSCULAR | Status: AC
  Administered 2022-05-17: 4 mg via INTRAVENOUS
  Filled 2022-05-17: qty 2

## 2022-05-17 MED ORDER — AMLODIPINE BESYLATE 5 MG PO TABS: 2.5000 mg | ORAL_TABLET | Freq: Every day | ORAL | Status: DC

## 2022-05-17 MED ORDER — METOPROLOL SUCCINATE ER 25 MG PO TB24: 25.0000 mg | ORAL_TABLET | Freq: Every evening | ORAL | 3 refills | Status: AC

## 2022-05-17 NOTE — Consult Note (Signed)
Referring Physician: Varney Biles, MD  Catherine [name] is an 51 y.o. female.                       Chief Complaint: Chest pain  HPI: 51 years old white female with h/o HTN, anxiety and obesity has substernal chest pressure since last night. She had flu like symptoms last week. She has PMH of migraine headaches and bipolar disorder. Her Troponin I levels are normal x 2. Her EKG shows sinus rhythm with chronic inferior and anterior ischemia.  Past Medical History:  Diagnosis Date   Anxiety    Bipolar 1 disorder ([place])    Cystitis, interstitial    Depression    Hypertension    Migraine       Past Surgical History:  Procedure Laterality Date   ABDOMINAL HYSTERECTOMY     CYSTOSCOPY     TOTAL ABDOMINAL HYSTERECTOMY W/ BILATERAL SALPINGOOPHORECTOMY      Family History  Problem Relation Age of Onset   Sudden death Mother        suicide   Hypothyroidism Sister    Social History:  reports that she quit smoking about 2 years ago. Her smoking use included cigarettes. She smoked an average of .2 packs per day. She has never used smokeless tobacco. She reports current alcohol use. She reports that she does not use drugs.  Allergies:  Allergies  Allergen Reactions   Penicillins Anaphylaxis and Rash    Has patient had a PCN reaction causing immediate rash, facial/tongue/throat swelling, SOB or lightheadedness with hypotension:  Has patient had a PCN reaction causing severe rash involving mucus membranes or skin necrosis: Has patient had a PCN reaction that required hospitalization Has patient had a PCN reaction occurring within the last 10 years: If all of the above answers are "NO", then may proceed with Cephalosporin use.    Amoxicillin-Pot Clavulanate     REACTION: SEVERE STOMACH CRAMPING See Penicillin entry.    Divalproex Sodium Nausea And Vomiting   Latex Hives, Swelling and Rash    Swelling of throat    (Not in a hospital admission)   Results for orders placed or  performed during the hospital encounter of 05/17/22 (from the past 48 hour(s))  Basic metabolic panel     Status: Abnormal   Collection Time: 05/17/22  4:00 AM  Result Value Ref Range   Sodium 140 135 - 145 mmol/L   Potassium 3.6 3.5 - 5.1 mmol/L   Chloride 104 98 - 111 mmol/L   CO2 25 22 - 32 mmol/L   Glucose, Bld 102 (H) 70 - 99 mg/dL    Comment: Glucose reference range applies only to samples taken after fasting for at least 8 hours.   BUN 16 6 - 20 mg/dL   Creatinine, Ser 0.71 0.44 - 1.00 mg/dL   Calcium 9.2 8.9 - 10.3 mg/dL   GFR, Estimated >60 >60 mL/min    Comment: (NOTE) Calculated using the CKD-EPI Creatinine Equation (2021)    Anion gap 11 5 - 15    Comment: Performed at [facility] [address] [place] [address]  CBC     Status: Abnormal   Collection Time: 05/17/22  4:00 AM  Result Value Ref Range   WBC 11.5 (H) 4.0 - 10.5 K/uL   RBC 4.46 3.87 - 5.11 MIL/uL   Hemoglobin 13.8 12.0 - 15.0 g/dL   HCT 39.6 36.0 - 46.0 %   MCV 88.8 80.0 -  100.0 fL   MCH 30.9 26.0 - 34.0 pg   MCHC 34.8 30.0 - 36.0 g/dL   RDW 13.2 11.5 - 15.5 %   Platelets 292 150 - 400 K/uL   nRBC 0.0 0.0 - 0.2 %    Comment: Performed at [facility], [place] [address] [place], [place] [address]  Troponin I (High Sensitivity)     Status: None   Collection Time: 05/17/22  4:00 AM  Result Value Ref Range   Troponin I (High Sensitivity) 6 <18 ng/L    Comment: (NOTE) Elevated high sensitivity troponin I (hsTnI) values and significant  changes across serial measurements may suggest ACS but many other  chronic and acute conditions are known to elevate hsTnI results.  Refer to the "Links" section for chest pain algorithms and additional  guidance. Performed at [facility], [place] [address], [place], [place] [address]   SARS Coronavirus 2 by RT PCR (hospital order, performed in [facility] hospital lab) *cepheid single result test* Anterior Nasal Swab     Status: None    Collection Time: 05/17/22  4:04 AM   Specimen: Anterior Nasal Swab  Result Value Ref Range   SARS Coronavirus 2 by RT PCR NEGATIVE NEGATIVE    Comment: (NOTE) SARS-CoV-2 target nucleic acids are NOT DETECTED.  The SARS-CoV-2 RNA is generally detectable in upper and lower respiratory specimens during the acute phase of infection. The lowest concentration of SARS-CoV-2 viral copies this assay can detect is 250 copies / mL. A negative result does not preclude SARS-CoV-2 infection and should not be used as the sole basis for treatment or other patient management decisions.  A negative result may occur with improper specimen collection / handling, submission of specimen other than nasopharyngeal swab, presence of viral mutation(s) within the areas targeted by this assay, and inadequate number of viral copies (<250 copies / mL). A negative result must be combined with clinical observations, patient history, and epidemiological information.  Fact Sheet for Patients:   https://www.patel.info/  Fact Sheet for Healthcare Providers: https://hall.com/  This test is not yet approved or  cleared by the [place] FDA and has been authorized for detection and/or diagnosis of SARS-CoV-2 by FDA under an Emergency Use Authorization (EUA).  This EUA will remain in effect (meaning this test can be used) for the duration of the COVID-19 declaration under Section 564(b)(1) of the Act, 21 U.S.C. section 360bbb-3(b)(1), unless the authorization is terminated or revoked sooner.  Performed at [facility], [place] [address] [place], [place] [address]   I-Stat beta hCG blood, ED     Status: None   Collection Time: 05/17/22  4:55 AM  Result Value Ref Range   I-stat hCG, quantitative <5.0 <5 mIU/mL   Comment 3            Comment:   GEST. AGE      CONC.  (mIU/mL)   <=1 WEEK        5 - 50     2 WEEKS       50 - 500     3 WEEKS       100 - 10,000     4  WEEKS     1,000 - 30,000        FEMALE AND NON-PREGNANT FEMALE:     LESS THAN 5 mIU/mL   Troponin I (High Sensitivity)     Status: None   Collection Time: 05/17/22  5:45 AM  Result  Value Ref Range   Troponin I (High Sensitivity) 7 <18 ng/L    Comment: (NOTE) Elevated high sensitivity troponin I (hsTnI) values and significant  changes across serial measurements may suggest ACS but many other  chronic and acute conditions are known to elevate hsTnI results.  Refer to the "Links" section for chest pain algorithms and additional  guidance. Performed at [facility], [place] [address], [place], [place] [address]    DG Chest 2 View  Result Date: 05/17/2022 CLINICAL DATA:  Chest pain EXAM: CHEST - 2 VIEW COMPARISON:  Chest x-ray 04/07/2021 FINDINGS: The heart size and mediastinal contours are within normal limits. Both lungs are clear. The visualized skeletal structures are unremarkable. IMPRESSION: No active cardiopulmonary disease. Electronically Signed   By: Ronney Asters M.D.   On: 05/17/2022 03:42    Review Of Systems Constitutional: No fever, chills, weight loss or gain. Eyes: No vision change, wears reading glasses. No discharge or pain. Ears: No hearing loss, No tinnitus. Respiratory: No asthma, COPD, pneumonias. No shortness of breath. No hemoptysis. Cardiovascular: Positive chest pain, no palpitation, leg edema. Gastrointestinal: No nausea, vomiting, diarrhea, constipation. No GI bleed. No hepatitis. Genitourinary: No dysuria, hematuria, kidney stone. No incontinance. Neurological: Positive migraine headache, no stroke, seizures.  Psychiatry: No psych facility admission for anxiety, depression, suicide. No detox. Skin: No rash. Musculoskeletal: Positive joint pain, fibromyalgia. No neck pain, back pain. Lymphadenopathy: No lymphadenopathy. Hematology: No anemia or easy bruising.   Blood pressure (!) 143/91, pulse 70, temperature 98.2 F (36.8 C), temperature source Oral,  resp. rate 19, height 5\' 9"  (1.753 m), weight 108.4 kg, SpO2 99 %. Body mass index is 35.29 kg/m. General appearance: alert, cooperative, appears stated age and no distress Head: Normocephalic, atraumatic. Eyes: Blue eyes, pink conjunctiva, corneas clear.  Neck: No adenopathy, no carotid bruit, no JVD, supple, symmetrical, trachea midline and thyroid not enlarged. Resp: Clear to auscultation bilaterally. Cardio: Regular rate and rhythm, S1, S2 normal, II/VI systolic murmur, no click, rub or gallop GI: Soft, non-tender; bowel sounds normal; no organomegaly. Extremities: No edema, cyanosis or clubbing. Skin: Warm and dry.  Neurologic: Alert and oriented X 3, normal strength. Normal coordination.  Assessment/Plan Chest pain HTN Obesity Anxiety and depression  Plan: Discussed medications, nuclear stress test, CT coronary angiography or possible cardiac catheterization. Patient prefers medication use first. Will start amlodipine 2.5 mg. In AM and Metoprolol 25 mg. XL in PM with one dose of 25 mg. metoprolol now. May discharge home with f/u by me in 2 weeks.  Time spent: Review of old records, Lab, x-rays, EKG, other cardiac tests, examination, discussion with patient/Doctor over 70 minutes.  Birdie Riddle, MD  05/17/2022, 12:28 PM

## 2022-05-17 NOTE — ED Provider Notes (Signed)
[facility] EMERGENCY DEPARTMENT Provider Note   CSN: [id number] Arrival date & time: 05/17/22  0303     History  Chief Complaint  Patient presents with   Chest Pain    Catherine [name] is a 51 y.o. female with past medical history significant for previous alcohol use, tobacco use, bipolar disorder, anxiety, depression, hypertension who presents with concern for central chest pain, dizziness, nausea.  Patient has had flulike illness on and off for 4 weeks that she was initially suspicious was COVID or flu.  Patient reports that last night she began having central chest pain that she describes as pressure or heavy weight on her chest.  It is not exertional in nature but is associated with nausea, she denies diaphoresis.  Patient reports that she does not currently smoke cigarettes but still uses tobacco vape products, denies recent heavy alcohol use.  Patient reports that she was initially suspicious of some indigestion, but did not seem similar to previous acid reflux or ulcers.  She does report some increased stress with watching movies last night and some emotional distress over character doubts but no significant life stressors compared to her baseline.  No personal previous history of ACS, stroke, strong family history of ACS.  Patient denies recent increasing chest pain, shortness of breath with exertion or previous similar symptoms.   Chest Pain      Home Medications Prior to Admission medications   Medication Sig Start Date End Date Taking? Authorizing Provider  Ascorbic Acid (VITAMIN C PO) Take 2 tablets by mouth daily.   Yes [provider]  aspirin-acetaminophen-caffeine (EXCEDRIN MIGRAINE) [id number] MG tablet Take 2 tablets by mouth 2 (two) times daily as needed for headache or migraine.   Yes [provider]  ibuprofen (ADVIL) 200 MG tablet Take 400-600 mg by mouth as needed (back pain).   Yes [provider]   lisinopril-hydrochlorothiazide (ZESTORETIC) 10-12.5 MG tablet Take 1 tablet by mouth daily. 02/06/22  Yes [provider]  amLODipine (NORVASC) 2.5 MG tablet Take 1 tablet (2.5 mg total) by mouth daily. 05/18/22   Dixie Dials, MD  aspirin EC 81 MG tablet Take 1 tablet (81 mg total) by mouth daily. Swallow whole. 05/17/22   Dixie Dials, MD  metoprolol succinate (TOPROL-XL) 25 MG 24 hr tablet Take 1 tablet (25 mg total) by mouth every evening. 05/17/22   Dixie Dials, MD      Allergies    Penicillins, Amoxicillin-pot clavulanate, Divalproex sodium, and Latex    Review of Systems   Review of Systems  Cardiovascular:  Positive for chest pain.  All other systems reviewed and are negative.   Physical Exam Updated Vital Signs BP (!) 144/95   Pulse (!) 101   Temp 98.2 F (36.8 C) (Oral)   Resp 17   Ht 5\' 9"  (1.753 m)   Wt 108.4 kg   SpO2 98%   BMI 35.29 kg/m  Physical Exam Vitals and nursing note reviewed.  Constitutional:      General: She is not in acute distress.    Appearance: Normal appearance.  HENT:     Head: Normocephalic and atraumatic.  Eyes:     General:        Right eye: No discharge.        Left eye: No discharge.  Cardiovascular:     Rate and Rhythm: Normal rate and regular rhythm.     Heart sounds: No murmur heard.    No friction rub. No gallop.  Pulmonary:     Effort: Pulmonary effort is normal.     Breath sounds: Normal breath sounds.     Comments: Clear breath sounds bilaterally, no respiratory distress Chest:     Comments: No significant TTP chest wall Abdominal:     General: Bowel sounds are normal.     Palpations: Abdomen is soft.  Skin:    General: Skin is warm and dry.     Capillary Refill: Capillary refill takes less than 2 seconds.  Neurological:     Mental Status: She is alert and oriented to person, place, and time.  Psychiatric:        Mood and Affect: Mood normal.        Behavior: Behavior normal.     ED Results /  Procedures / Treatments   Labs (all labs ordered are listed, but only abnormal results are displayed) Labs Reviewed  BASIC METABOLIC PANEL - Abnormal; Notable for the following components:      Result Value   Glucose, Bld 102 (*)    All other components within normal limits  CBC - Abnormal; Notable for the following components:   WBC 11.5 (*)    All other components within normal limits  SARS CORONAVIRUS 2 BY RT PCR  I-STAT BETA HCG BLOOD, ED ([facility], [facility], [facility] ONLY)  TROPONIN I (HIGH SENSITIVITY)  TROPONIN I (HIGH SENSITIVITY)    EKG EKG Interpretation  Date/Time:  Sunday May 17 2022 03:50:34 EDT Ventricular Rate:  88 PR Interval:  160 QRS Duration: 72 QT Interval:  342 QTC Calculation: 413 R Axis:   56 Text Interpretation: Normal sinus rhythm Cannot rule out Anterior infarct , age undetermined Abnormal ECG When compared with ECG of 07-Apr-2021 13:43, PREVIOUS ECG IS PRESENT No significant change since last tracing Confirmed by Catherine LefevreHaviland, Catherine [name] [phone]([id number]) on 05/17/2022 9:04:11 AM  Radiology DG Chest 2 View  Result Date: 05/17/2022 CLINICAL DATA:  Chest pain EXAM: CHEST - 2 VIEW COMPARISON:  Chest x-ray 04/07/2021 FINDINGS: The heart size and mediastinal contours are within normal limits. Both lungs are clear. The visualized skeletal structures are unremarkable. IMPRESSION: No active cardiopulmonary disease. Electronically Signed   By: Darliss CheneyAmy  [name] M.D.   On: 05/17/2022 03:42    Procedures Procedures    Medications Ordered in ED Medications  amLODipine (NORVASC) tablet 2.5 mg (has no administration in time range)  metoprolol succinate (TOPROL-XL) 24 hr tablet 25 mg (has no administration in time range)  aspirin EC tablet 81 mg (81 mg Oral Given 05/17/22 1252)  morphine (PF) 2 MG/ML injection 2 mg (2 mg Intravenous Given 05/17/22 1010)  alum & mag hydroxide-simeth (MAALOX/MYLANTA) 200-200-20 MG/5ML suspension 30 mL (30 mLs Oral Given 05/17/22 1010)    And  lidocaine (XYLOCAINE) 2  % viscous mouth solution 15 mL (15 mLs Oral Given 05/17/22 1010)  ondansetron (ZOFRAN) injection 4 mg (4 mg Intravenous Given 05/17/22 1008)  metoprolol tartrate (LOPRESSOR) tablet 25 mg (25 mg Oral Given 05/17/22 1252)    ED Course/ Medical Decision Making/ A&P Clinical Course as of 05/17/22 1302  Sun May 17, 2022  1056 Patient reports minimal improvement of chest pain after medications provided. Still 7/10, described as pressure. Nausea improved. [CP]  [id number] [phone] -- Catherine [name] will see and likely admit [CP]    Clinical Course User Index [CP] Olene FlossProsperi, Catherine Korte H, PA-C                           Medical Decision  Making Amount and/or Complexity of Data Reviewed Labs: ordered. Radiology: ordered.  Risk OTC drugs. Prescription drug management.   This patient is a 51 y.o. female who presents to the ED for concern of chest pain, this involves an extensive number of treatment options, and is a complaint that carries with it a high risk of complications and morbidity. The emergent differential diagnosis prior to evaluation includes, but is not limited to,  ACS, AAS, PE, Mallory-Weiss, Boerhaave's, Pneumonia, acute bronchitis, asthma or COPD exacerbation, anxiety, MSK pain or traumatic injury to the chest, acid reflux versus other .   This is not an exhaustive differential.   Past Medical History / Co-morbidities / Social History: Alcohol abuse, tobacco abuse, bipolar, HTN  Additional history: Chart reviewed. Pertinent results include: reviewed outpatient PCP visits, remote labwork, imagine from ED visit around one year ago  Physical Exam: Physical exam performed. The pertinent findings include: mild hypertension  Lab Tests: I ordered, and personally interpreted labs.  The pertinent results include:  mild leukocytosis, 11.5, favor likely stress. BMP with no significant abnormalities. Covid negative, troponin negative x2 without delta.   Imaging Studies: I ordered imaging studies  including plain film chest xray. I independently visualized and interpreted imaging which showed no significant intrathoracic abnormality. I agree with the radiologist interpretation.   Cardiac Monitoring:  The patient was maintained on a cardiac monitor.  My attending physician Dr. Rhunette Croft viewed and interpreted the cardiac monitored which showed an underlying rhythm of: Sinus rhythm, evidence of possible ischemia, but no acute ischemic changes noted. I agree with this interpretation.   Medications: I ordered medication including GI cocktail, morphine, Zofran for possible GI related chest pain versus cardiac chest pain, after Dr. Algie Coffer recommendations, patient given first dose of aspirin, Norvasc, Toprol at this time . Reevaluation of the patient after these medicines showed that the patient stayed the same. I have reviewed the patients home medicines and have made adjustments as needed.  Patient with mild improvement of chest pain from 10/10 to 7/10 after medications administered but still having ongoing chest pain which feels like pressure  Consultations Obtained: I requested consultation with the cardiologist, spoke with Dr. Sharyn Lull who requested I consult Dr. Algie Coffer,  and discussed lab and imaging findings as well as pertinent plan - they recommend: Norvasc, metoprolol daily, follow-up with cardiology outpatient   Disposition: After consideration of the diagnostic results and the patients response to treatment, I feel that patient has some components of high risk chest pain with possible exertional component, description of pressure on chest, episode of isolated diaphoresis, nausea, although she does not have significant risk factors other than history of tobacco use, hypertension.  She is a heart score of 3 today.  Given her ongoing chest pain we consulted cardiology who feels comfortable with follow-up on an outpatient basis, low suspicion for active ischemic chest pain based on duration of  symptoms and other risk factors.   emergency department workup does not suggest an emergent condition requiring admission or immediate intervention beyond what has been performed at this time. The plan is: as above. The patient is safe for discharge and has been instructed to return immediately for worsening symptoms, change in symptoms or any other concerns.  I discussed this case with my attending physician Dr. Rhunette Croft who cosigned this note including patient's presenting symptoms, physical exam, and planned diagnostics and interventions. Attending physician stated agreement with plan or made changes to plan which were implemented.    Final Clinical Impression(s) /  ED Diagnoses Final diagnoses:  Chest wall pain    Rx / DC Orders ED Discharge Orders          Ordered    amLODipine (NORVASC) 2.5 MG tablet  Daily        05/17/22 1241    aspirin EC 81 MG tablet  Daily        05/17/22 1241    metoprolol succinate (TOPROL-XL) 25 MG 24 hr tablet  Every evening        05/17/22 1241              Amonte Brookover, [place], PA-C 05/17/22 1302    Catherine Lefevre, MD 05/17/22 1502

## 2022-05-17 NOTE — Discharge Instructions (Addendum)
Take the norvasc, toprol that you were prescribed by the cardiologist, and call to schedule follow up appointment for around 2 weeks from now with Dr. Doylene Canard. If worsening symptoms, shortness of breath, fever, chills, please return to ED for further evaluation.

## 2022-05-17 NOTE — ED Triage Notes (Signed)
Pt reported to ED with c/o central chest pain since approximately 1030 last night. Pt states she is also experiencing dizziness and nausea. States she is recently recovering from a flulike illness.

## 2022-05-17 NOTE — ED Triage Notes (Deleted)
Pt brought to ED by [facility] with c/o severe substernal chest pain that does not radiate. Pt recently had increase in phentermine dose. Pt experiencing nausea. Nitro 0.4mg  SL administered and did not affect pain.   EMS Interventions Zofran 4mg  IVP Nitro 0.4mg  SL 20g IV LAC  EMS Vitals BP 120/68 HR 74 RR 22 SPO2 100% RA CBG 148

## 2022-05-26 DIAGNOSIS — M545 Low back pain, unspecified: Secondary | ICD-10-CM | POA: Diagnosis not present

## 2022-05-26 DIAGNOSIS — M479 Spondylosis, unspecified: Secondary | ICD-10-CM | POA: Diagnosis not present

## 2022-06-22 DIAGNOSIS — Z6838 Body mass index (BMI) 38.0-38.9, adult: Secondary | ICD-10-CM | POA: Diagnosis not present

## 2022-06-22 DIAGNOSIS — M545 Low back pain, unspecified: Secondary | ICD-10-CM | POA: Diagnosis not present

## 2022-06-22 DIAGNOSIS — I10 Essential (primary) hypertension: Secondary | ICD-10-CM | POA: Diagnosis not present

## 2022-06-22 DIAGNOSIS — R7309 Other abnormal glucose: Secondary | ICD-10-CM | POA: Diagnosis not present

## 2022-06-22 DIAGNOSIS — R030 Elevated blood-pressure reading, without diagnosis of hypertension: Secondary | ICD-10-CM | POA: Diagnosis not present

## 2022-06-22 DIAGNOSIS — R0789 Other chest pain: Secondary | ICD-10-CM | POA: Diagnosis not present

## 2022-06-29 DIAGNOSIS — R7309 Other abnormal glucose: Secondary | ICD-10-CM | POA: Diagnosis not present

## 2022-06-29 DIAGNOSIS — L241 Irritant contact dermatitis due to oils and greases: Secondary | ICD-10-CM | POA: Diagnosis not present

## 2022-06-29 DIAGNOSIS — M545 Low back pain, unspecified: Secondary | ICD-10-CM | POA: Diagnosis not present

## 2022-06-29 DIAGNOSIS — R030 Elevated blood-pressure reading, without diagnosis of hypertension: Secondary | ICD-10-CM | POA: Diagnosis not present

## 2022-06-29 DIAGNOSIS — I10 Essential (primary) hypertension: Secondary | ICD-10-CM | POA: Diagnosis not present

## 2022-06-29 DIAGNOSIS — Z6838 Body mass index (BMI) 38.0-38.9, adult: Secondary | ICD-10-CM | POA: Diagnosis not present

## 2022-08-03 DIAGNOSIS — I10 Essential (primary) hypertension: Secondary | ICD-10-CM | POA: Diagnosis not present

## 2022-08-03 DIAGNOSIS — Z1339 Encounter for screening examination for other mental health and behavioral disorders: Secondary | ICD-10-CM | POA: Diagnosis not present

## 2022-08-03 DIAGNOSIS — Z131 Encounter for screening for diabetes mellitus: Secondary | ICD-10-CM | POA: Diagnosis not present

## 2022-08-03 DIAGNOSIS — R030 Elevated blood-pressure reading, without diagnosis of hypertension: Secondary | ICD-10-CM | POA: Diagnosis not present

## 2022-08-03 DIAGNOSIS — Z23 Encounter for immunization: Secondary | ICD-10-CM | POA: Diagnosis not present

## 2022-08-03 DIAGNOSIS — Z0000 Encounter for general adult medical examination without abnormal findings: Secondary | ICD-10-CM | POA: Diagnosis not present

## 2022-08-03 DIAGNOSIS — R072 Precordial pain: Secondary | ICD-10-CM | POA: Diagnosis not present

## 2022-08-03 DIAGNOSIS — Z6837 Body mass index (BMI) 37.0-37.9, adult: Secondary | ICD-10-CM | POA: Diagnosis not present

## 2022-08-17 DIAGNOSIS — H6123 Impacted cerumen, bilateral: Secondary | ICD-10-CM | POA: Diagnosis not present

## 2022-09-18 DIAGNOSIS — L989 Disorder of the skin and subcutaneous tissue, unspecified: Secondary | ICD-10-CM | POA: Diagnosis not present

## 2022-09-18 DIAGNOSIS — R0789 Other chest pain: Secondary | ICD-10-CM | POA: Diagnosis not present

## 2022-10-11 ENCOUNTER — Emergency Department (HOSPITAL_COMMUNITY): Payer: BC Managed Care – PPO

## 2022-10-11 ENCOUNTER — Encounter (HOSPITAL_COMMUNITY): Payer: Self-pay

## 2022-10-11 ENCOUNTER — Emergency Department (HOSPITAL_COMMUNITY)
Admission: EM | Admit: 2022-10-11 | Discharge: 2022-10-11 | Disposition: A | Discharge status: Home / Self Care | Payer: BC Managed Care – PPO | Attending: Emergency Medicine | Admitting: Emergency Medicine

## 2022-10-11 DIAGNOSIS — Z91040 Latex allergy status: Secondary | ICD-10-CM | POA: Diagnosis not present

## 2022-10-11 DIAGNOSIS — Z79899 Other long term (current) drug therapy: Secondary | ICD-10-CM | POA: Insufficient documentation

## 2022-10-11 DIAGNOSIS — R079 Chest pain, unspecified: Secondary | ICD-10-CM | POA: Diagnosis not present

## 2022-10-11 DIAGNOSIS — E876 Hypokalemia: Secondary | ICD-10-CM | POA: Diagnosis not present

## 2022-10-11 DIAGNOSIS — R0789 Other chest pain: Secondary | ICD-10-CM | POA: Diagnosis not present

## 2022-10-11 DIAGNOSIS — I10 Essential (primary) hypertension: Secondary | ICD-10-CM | POA: Insufficient documentation

## 2022-10-11 DIAGNOSIS — Z7982 Long term (current) use of aspirin: Secondary | ICD-10-CM | POA: Insufficient documentation

## 2022-10-11 DIAGNOSIS — R457 State of emotional shock and stress, unspecified: Secondary | ICD-10-CM | POA: Diagnosis not present

## 2022-10-11 DIAGNOSIS — R202 Paresthesia of skin: Secondary | ICD-10-CM | POA: Diagnosis not present

## 2022-10-11 LAB — COMPREHENSIVE METABOLIC PANEL
ALT: 126 U/L — ABNORMAL HIGH (ref 0–44)
AST: 246 U/L — ABNORMAL HIGH (ref 15–41)
Albumin: 2.9 g/dL — ABNORMAL LOW (ref 3.5–5.0)
Alkaline Phosphatase: 76 U/L (ref 38–126)
Anion gap: 10 (ref 5–15)
BUN: 11 mg/dL (ref 6–20)
CO2: 21 mmol/L — ABNORMAL LOW (ref 22–32)
Calcium: 8.2 mg/dL — ABNORMAL LOW (ref 8.9–10.3)
Chloride: 106 mmol/L (ref 98–111)
Creatinine, Ser: 0.57 mg/dL (ref 0.44–1.00)
GFR, Estimated: >60 mL/min (ref >60)
Glucose, Bld: 81 mg/dL (ref 70–99)
Potassium: 2.8 mmol/L — ABNORMAL LOW (ref 3.5–5.1)
Sodium: 137 mmol/L (ref 135–145)
Total Bilirubin: 1.2 mg/dL (ref 0.3–1.2)
Total Protein: 5.7 g/dL — ABNORMAL LOW (ref 6.5–8.1)

## 2022-10-11 LAB — CBC WITH DIFFERENTIAL/PLATELET
Abs Immature Granulocytes: 0.1 10*3/uL — ABNORMAL HIGH (ref 0.00–0.07)
Basophils Absolute: 0.1 10*3/uL (ref 0.0–0.1)
Basophils Relative: 0 %
Eosinophils Absolute: 0.1 10*3/uL (ref 0.0–0.5)
Eosinophils Relative: 1 %
HCT: 35.3 % — ABNORMAL LOW (ref 36.0–46.0)
Hemoglobin: 12.1 g/dL (ref 12.0–15.0)
Immature Granulocytes: 1 %
Lymphocytes Relative: 13 %
Lymphs Abs: 1.7 10*3/uL (ref 0.7–4.0)
MCH: 30.9 pg (ref 26.0–34.0)
MCHC: 34.3 g/dL (ref 30.0–36.0)
MCV: 90.1 fL (ref 80.0–100.0)
Monocytes Absolute: 0.7 10*3/uL (ref 0.1–1.0)
Monocytes Relative: 5 %
Neutro Abs: 10.2 10*3/uL — ABNORMAL HIGH (ref 1.7–7.7)
Neutrophils Relative %: 80 %
Platelets: 264 10*3/uL (ref 150–400)
RBC: 3.92 MIL/uL (ref 3.87–5.11)
RDW: 13.6 % (ref 11.5–15.5)
WBC: 12.8 10*3/uL — ABNORMAL HIGH (ref 4.0–10.5)
nRBC: 0 % (ref 0.0–0.2)

## 2022-10-11 LAB — TROPONIN I (HIGH SENSITIVITY)
Troponin I (High Sensitivity): 6 ng/L (ref <18)
Troponin I (High Sensitivity): 5 ng/L (ref <18)

## 2022-10-11 LAB — MAGNESIUM: Magnesium: 1.5 mg/dL — ABNORMAL LOW (ref 1.7–2.4)

## 2022-10-11 MED ORDER — POTASSIUM CHLORIDE ER 20 MEQ PO TBCR: 20.0000 meq | EXTENDED_RELEASE_TABLET | Freq: Every day | ORAL | 0 refills | Status: AC

## 2022-10-11 MED ORDER — LORAZEPAM 1 MG PO TABS
0.5000 mg | ORAL_TABLET | Freq: Once | ORAL | Status: AC
  Administered 2022-10-11: 0.5 mg via ORAL
  Filled 2022-10-11: qty 1

## 2022-10-11 MED ORDER — ALUM & MAG HYDROXIDE-SIMETH 200-200-20 MG/5ML PO SUSP
30.0000 mL | Freq: Once | ORAL | Status: AC
  Administered 2022-10-11: 30 mL via ORAL
  Filled 2022-10-11: qty 30

## 2022-10-11 MED ORDER — SODIUM CHLORIDE 0.9 % IV BOLUS
1000.0000 mL | Freq: Once | INTRAVENOUS | Status: AC
  Administered 2022-10-11: 1000 mL via INTRAVENOUS

## 2022-10-11 MED ORDER — SUCRALFATE 1 G PO TABS: 1.0000 g | ORAL_TABLET | Freq: Three times a day (TID) | ORAL | 0 refills | Status: AC

## 2022-10-11 MED ORDER — LIDOCAINE VISCOUS HCL 2 % MT SOLN
15.0000 mL | Freq: Once | OROMUCOSAL | Status: AC
  Administered 2022-10-11: 15 mL via ORAL
  Filled 2022-10-11: qty 15

## 2022-10-11 MED ORDER — PANTOPRAZOLE SODIUM 40 MG IV SOLR
40.0000 mg | Freq: Once | INTRAVENOUS | Status: AC
  Administered 2022-10-11: 40 mg via INTRAVENOUS
  Filled 2022-10-11: qty 10

## 2022-10-11 MED ORDER — PANTOPRAZOLE SODIUM 40 MG PO TBEC: 40.0000 mg | DELAYED_RELEASE_TABLET | Freq: Every day | ORAL | 0 refills | Status: AC

## 2022-10-11 MED ORDER — POTASSIUM CHLORIDE CRYS ER 20 MEQ PO TBCR
40.0000 meq | EXTENDED_RELEASE_TABLET | Freq: Once | ORAL | Status: AC
  Administered 2022-10-11: 40 meq via ORAL
  Filled 2022-10-11: qty 2

## 2022-10-11 MED ORDER — MAGNESIUM SULFATE 2 GM/50ML IV SOLN
2.0000 g | Freq: Once | INTRAVENOUS | Status: AC
  Administered 2022-10-11: 2 g via INTRAVENOUS
  Filled 2022-10-11: qty 50

## 2022-10-11 NOTE — ED Notes (Signed)
Patient transported to X-ray 

## 2022-10-11 NOTE — Discharge Instructions (Signed)
Symptoms today seem consistent with a possible GI or esophageal cause of your chest pain, your workup did not show any evidence of abnormality in your heart or lungs.  Your chest pain seem to improve after increasing medicines that are aimed at helping stomach irritation, I would begin taking medications as prescribed, follow-up closely with your cardiologist as well as schedule an appointment with the GI doctors contact information I provided to discuss further treatment, and possible endoscopy if you continue to have persistent chest pain with no clear explanation.

## 2022-10-11 NOTE — ED Provider Notes (Signed)
[facility] Provider Note   CSN: [id number] Arrival date & time: 10/11/22  1222     History  Chief Complaint  Patient presents with   Chest Pain    Catherine [name] is a 52 y.o. female. With past medical history of alcohol abuse, bipolar 1 disorder, hypertension, migraine who presents to the emergency department with chest pain.  States she has been having worsening chest pain over the past 4 days.  She states that over many months she has had constant generalized chest pressure that is always there.  She states that she was here for chest pain in September of last year and was seen by Dr. Doylene Canard in the emergency department and placed on amlodipine and metoprolol in addition to her lisinopril-hydrochlorothiazide.  She states that about 4 days ago her primary care discontinued her amlodipine and metoprolol after she had a normal coronary calcium scan.  She states that since then she has had worsening chest pressure and central, sharp chest pain.  She states that it is intermittently radiated into her right arm with some numbness and tingling and she has had 1 episode of diaphoresis.  She also endorses palpitations.  Denies shortness of breath.  Denies cough or fever or syncope.   Chest Pain Associated symptoms: palpitations        Home Medications Prior to Admission medications   Medication Sig Start Date End Date Taking? Authorizing Provider  amLODipine (NORVASC) 2.5 MG tablet Take 1 tablet (2.5 mg total) by mouth daily. 05/18/22   Dixie Dials, MD  Ascorbic Acid (VITAMIN C PO) Take 2 tablets by mouth daily.    [provider]  aspirin EC 81 MG tablet Take 1 tablet (81 mg total) by mouth daily. Swallow whole. 05/17/22   Dixie Dials, MD  aspirin-acetaminophen-caffeine (EXCEDRIN MIGRAINE) [id number] MG tablet Take 2 tablets by mouth 2 (two) times daily as needed for headache or migraine.    [provider]  ibuprofen  (ADVIL) 200 MG tablet Take 400-600 mg by mouth as needed (back pain).    [provider]  lisinopril-hydrochlorothiazide (ZESTORETIC) 10-12.5 MG tablet Take 1 tablet by mouth daily. 02/06/22   [provider]  metoprolol succinate (TOPROL-XL) 25 MG 24 hr tablet Take 1 tablet (25 mg total) by mouth every evening. 05/17/22   Dixie Dials, MD      Allergies    Penicillins, Amoxicillin-pot clavulanate, Divalproex sodium, and Latex    Review of Systems   Review of Systems  Cardiovascular:  Positive for chest pain and palpitations.  All other systems reviewed and are negative.   Physical Exam Updated Vital Signs BP 116/77   Pulse 90   Temp (!) 96.7 F (35.9 C) (Axillary)   Resp 14   SpO2 100%  Physical Exam Vitals and nursing note reviewed.  Constitutional:      Appearance: Normal appearance. She is well-developed. She is obese. She is not ill-appearing.  HENT:     Head: Normocephalic.  Eyes:     General: No scleral icterus. Neck:     Vascular: No JVD.  Cardiovascular:     Rate and Rhythm: Normal rate and regular rhythm.     Pulses:          Radial pulses are 2+ on the right side and 2+ on the left side.     Heart sounds: Normal heart sounds. No murmur heard. Pulmonary:     Effort: Pulmonary effort is normal. No tachypnea  or respiratory distress.     Breath sounds: Normal breath sounds.  Chest:     Chest wall: No tenderness.  Abdominal:     General: Bowel sounds are normal.     Palpations: Abdomen is soft.  Musculoskeletal:     Right lower leg: No edema.     Left lower leg: No edema.  Skin:    General: Skin is warm and dry.     Capillary Refill: Capillary refill takes less than 2 seconds.  Neurological:     General: No focal deficit present.     Mental Status: She is alert and oriented to person, place, and time.  Psychiatric:        Mood and Affect: Mood normal.        Behavior: Behavior normal.     ED Results / Procedures / Treatments    Labs (all labs ordered are listed, but only abnormal results are displayed) Labs Reviewed  COMPREHENSIVE METABOLIC PANEL - Abnormal; Notable for the following components:      Result Value   Potassium 2.8 (*)    CO2 21 (*)    Calcium 8.2 (*)    Total Protein 5.7 (*)    Albumin 2.9 (*)    AST 246 (*)    ALT 126 (*)    All other components within normal limits  CBC WITH DIFFERENTIAL/PLATELET - Abnormal; Notable for the following components:   WBC 12.8 (*)    HCT 35.3 (*)    Neutro Abs 10.2 (*)    Abs Immature Granulocytes 0.10 (*)    All other components within normal limits  MAGNESIUM  TROPONIN I (HIGH SENSITIVITY)  TROPONIN I (HIGH SENSITIVITY)    EKG EKG Interpretation  Date/Time:  Sunday October 11 2022 13:23:51 EST Ventricular Rate:  93 PR Interval:  144 QRS Duration: 91 QT Interval:  387 QTC Calculation: 482 R Axis:   25 Text Interpretation: Sinus rhythm Low voltage, precordial leads Nonspecific T abnormalities, diffuse leads Confirmed by Dene Gentry [phone]) on 10/11/2022 2:46:33 PM  Radiology DG Chest 2 View  Result Date: 10/11/2022 CLINICAL DATA:  Acute chest pain. EXAM: CHEST - 2 VIEW COMPARISON:  05/17/2022 and prior studies FINDINGS: Telemetry leads overlie the chest. The cardiomediastinal silhouette is unremarkable. There is no evidence of focal airspace disease, pulmonary edema, suspicious pulmonary nodule/mass, pleural effusion, or pneumothorax. No acute bony abnormalities are identified. IMPRESSION: No active cardiopulmonary disease. Electronically Signed   By: Margarette Canada M.D.   On: 10/11/2022 13:07    Procedures Procedures   Medications Ordered in ED Medications  alum & mag hydroxide-simeth (MAALOX/MYLANTA) 200-200-20 MG/5ML suspension 30 mL (has no administration in time range)    And  lidocaine (XYLOCAINE) 2 % viscous mouth solution 15 mL (has no administration in time range)  pantoprazole (PROTONIX) injection 40 mg (has no administration in time  range)  potassium chloride SA (KLOR-CON M) CR tablet 40 mEq (has no administration in time range)  sodium chloride 0.9 % bolus 1,000 mL (has no administration in time range)  LORazepam (ATIVAN) tablet 0.5 mg (0.5 mg Oral Given 10/11/22 1357)    ED Course/ Medical Decision Making/ A&P Clinical Course as of 10/11/22 1506  Sun Oct 11, 2022  1459 H.o etoh abuse, HTN, coming in with chest pain. Always with chest pressure, normally there. Worse over last few days. CT coronary score that was very low. Recently discontinued from several blood pressure medications. Need potassium, maybe mag.  [CP]    Clinical  Course User Index [CP] Anselmo Pickler, PA-C   Care of patient being handed off to [place], PA-C at change of shift. Here with acute on chronic chest pain. Recently stopped antihypertensives, but BP stable in ED. Troponin x1 negative so far. K low, obtaining mag for replacements. Overall, from cardiac standpoint feel she is lower risk. Can get ambulatory referral to cards if delta trop is negative. Additionally, significant LFT elevation in setting of alcohol use. She may have alcoholic gastritis. She was given ativan when she arrived as she seemed anxious. Anticipate discharge with reassuring work up.  Final Clinical Impression(s) / ED Diagnoses Final diagnoses:  None    Rx / DC Orders ED Discharge Orders     None         Mickie Hillier, PA-C 10/11/22 1508    Valarie Merino, MD 10/11/22 [phone]

## 2022-10-11 NOTE — ED Provider Notes (Signed)
Accepted handoff at shift change from Theodis Blaze PA-C. Please see prior provider note for more detail.   Briefly: Patient is 52 y.o.   DDX: concern for chest pain  Plan: H.o etoh abuse, HTN, coming in with chest pain. Always with chest pressure, normally there. Worse over last few days. CT coronary score that was very low. Recently discontinued from several blood pressure medications. Need potassium, maybe mag.   And apparently interpreted lab work showing hypomagnesemia, magnesium 1.5, along with potassium 2.8.  We will orally replete potassium, and administer IV mag, administer GI cocktail, Protonix, and patient reports improvement of symptoms on reassessment.  Her delta troponin is negative, her chest pain at this time does not seem exertional in nature, low suspicion for any occult cardiac chest pain.  But she is stable for discharge from emergency department perspective, I have some suspicion of alcoholic hepatitis versus other hepatitis with AST greater than ALT, but patient without any evidence of acute liver failure, will discharge with potassium supplementation, Protonix, Carafate, encourage close GI follow-up as well as cardiology follow-up.      RISR  EDTHIS    Dorien Chihuahua 10/11/22 1708    Dorie Rank, MD 10/12/22 [phone]

## 2022-10-11 NOTE — ED Triage Notes (Signed)
Pt BIB [facility] from home d/t CP that started around 1000. Central, no radiation, took 2 Nitro, & her Amlodipine & Metoprolol. Pt states her doctor told her to take the HTN meds if she gets CP, EMS arrived & her BP was 104/56, & reports her CP has gradually gotten better. 100 bpm, 28 resp, 100% on RA, CBG 123, GCS 15. EMS also gave 324 ASA & 250 cc NS in 20g PIV in Lt hand. Pt also reports this happened to her in August. Also reports she has been off the HTN meds for 1 wk since when she took them prior it made her have bad side effects such as confusion & acting altered (per pt).

## 2022-10-16 DIAGNOSIS — R945 Abnormal results of liver function studies: Secondary | ICD-10-CM | POA: Diagnosis not present

## 2022-10-16 DIAGNOSIS — R0789 Other chest pain: Secondary | ICD-10-CM | POA: Diagnosis not present

## 2022-10-16 DIAGNOSIS — J069 Acute upper respiratory infection, unspecified: Secondary | ICD-10-CM | POA: Diagnosis not present

## 2022-10-17 ENCOUNTER — Emergency Department (HOSPITAL_COMMUNITY): Payer: BC Managed Care – PPO

## 2022-10-17 ENCOUNTER — Other Ambulatory Visit: Payer: Self-pay

## 2022-10-17 ENCOUNTER — Inpatient Hospital Stay (HOSPITAL_COMMUNITY)
Admission: EM | Admit: 2022-10-17 | Discharge: 2022-10-25 | DRG: 418 | Disposition: A | Discharge status: Home / Self Care | Payer: BC Managed Care – PPO | Attending: Internal Medicine | Admitting: Internal Medicine

## 2022-10-17 ENCOUNTER — Encounter (HOSPITAL_COMMUNITY): Payer: Self-pay

## 2022-10-17 DIAGNOSIS — Z880 Allergy status to penicillin: Secondary | ICD-10-CM

## 2022-10-17 DIAGNOSIS — E871 Hypo-osmolality and hyponatremia: Secondary | ICD-10-CM | POA: Diagnosis not present

## 2022-10-17 DIAGNOSIS — Z6838 Body mass index (BMI) 38.0-38.9, adult: Secondary | ICD-10-CM

## 2022-10-17 DIAGNOSIS — Z9079 Acquired absence of other genital organ(s): Secondary | ICD-10-CM | POA: Diagnosis not present

## 2022-10-17 DIAGNOSIS — K8021 Calculus of gallbladder without cholecystitis with obstruction: Secondary | ICD-10-CM | POA: Diagnosis not present

## 2022-10-17 DIAGNOSIS — Z90722 Acquired absence of ovaries, bilateral: Secondary | ICD-10-CM | POA: Diagnosis not present

## 2022-10-17 DIAGNOSIS — Z7982 Long term (current) use of aspirin: Secondary | ICD-10-CM

## 2022-10-17 DIAGNOSIS — I10 Essential (primary) hypertension: Secondary | ICD-10-CM | POA: Diagnosis not present

## 2022-10-17 DIAGNOSIS — R7989 Other specified abnormal findings of blood chemistry: Secondary | ICD-10-CM | POA: Diagnosis not present

## 2022-10-17 DIAGNOSIS — K8020 Calculus of gallbladder without cholecystitis without obstruction: Secondary | ICD-10-CM | POA: Diagnosis present

## 2022-10-17 DIAGNOSIS — K449 Diaphragmatic hernia without obstruction or gangrene: Secondary | ICD-10-CM | POA: Diagnosis not present

## 2022-10-17 DIAGNOSIS — E876 Hypokalemia: Secondary | ICD-10-CM | POA: Diagnosis not present

## 2022-10-17 DIAGNOSIS — K8050 Calculus of bile duct without cholangitis or cholecystitis without obstruction: Secondary | ICD-10-CM | POA: Diagnosis not present

## 2022-10-17 DIAGNOSIS — F419 Anxiety disorder, unspecified: Secondary | ICD-10-CM | POA: Diagnosis present

## 2022-10-17 DIAGNOSIS — K8066 Calculus of gallbladder and bile duct with acute and chronic cholecystitis without obstruction: Secondary | ICD-10-CM | POA: Diagnosis not present

## 2022-10-17 DIAGNOSIS — K8689 Other specified diseases of pancreas: Secondary | ICD-10-CM | POA: Diagnosis not present

## 2022-10-17 DIAGNOSIS — F1021 Alcohol dependence, in remission: Secondary | ICD-10-CM | POA: Diagnosis present

## 2022-10-17 DIAGNOSIS — K859 Acute pancreatitis without necrosis or infection, unspecified: Secondary | ICD-10-CM | POA: Diagnosis not present

## 2022-10-17 DIAGNOSIS — F319 Bipolar disorder, unspecified: Secondary | ICD-10-CM | POA: Diagnosis not present

## 2022-10-17 DIAGNOSIS — R748 Abnormal levels of other serum enzymes: Secondary | ICD-10-CM | POA: Diagnosis not present

## 2022-10-17 DIAGNOSIS — K838 Other specified diseases of biliary tract: Secondary | ICD-10-CM | POA: Diagnosis not present

## 2022-10-17 DIAGNOSIS — R6881 Early satiety: Secondary | ICD-10-CM | POA: Diagnosis not present

## 2022-10-17 DIAGNOSIS — Z881 Allergy status to other antibiotic agents status: Secondary | ICD-10-CM | POA: Diagnosis not present

## 2022-10-17 DIAGNOSIS — Z90710 Acquired absence of both cervix and uterus: Secondary | ICD-10-CM

## 2022-10-17 DIAGNOSIS — R079 Chest pain, unspecified: Secondary | ICD-10-CM | POA: Diagnosis not present

## 2022-10-17 DIAGNOSIS — G4489 Other headache syndrome: Secondary | ICD-10-CM | POA: Diagnosis not present

## 2022-10-17 DIAGNOSIS — G43909 Migraine, unspecified, not intractable, without status migrainosus: Secondary | ICD-10-CM | POA: Diagnosis present

## 2022-10-17 DIAGNOSIS — R000 Tachycardia, unspecified: Secondary | ICD-10-CM | POA: Diagnosis present

## 2022-10-17 DIAGNOSIS — R109 Unspecified abdominal pain: Secondary | ICD-10-CM | POA: Diagnosis not present

## 2022-10-17 DIAGNOSIS — K567 Ileus, unspecified: Secondary | ICD-10-CM | POA: Diagnosis present

## 2022-10-17 DIAGNOSIS — K660 Peritoneal adhesions (postprocedural) (postinfection): Secondary | ICD-10-CM | POA: Diagnosis present

## 2022-10-17 DIAGNOSIS — E162 Hypoglycemia, unspecified: Secondary | ICD-10-CM | POA: Diagnosis present

## 2022-10-17 DIAGNOSIS — E669 Obesity, unspecified: Secondary | ICD-10-CM | POA: Diagnosis not present

## 2022-10-17 DIAGNOSIS — K8000 Calculus of gallbladder with acute cholecystitis without obstruction: Secondary | ICD-10-CM | POA: Diagnosis not present

## 2022-10-17 DIAGNOSIS — R0789 Other chest pain: Secondary | ICD-10-CM | POA: Diagnosis not present

## 2022-10-17 DIAGNOSIS — K851 Biliary acute pancreatitis without necrosis or infection: Secondary | ICD-10-CM

## 2022-10-17 DIAGNOSIS — R188 Other ascites: Secondary | ICD-10-CM | POA: Diagnosis not present

## 2022-10-17 DIAGNOSIS — K812 Acute cholecystitis with chronic cholecystitis: Secondary | ICD-10-CM | POA: Diagnosis not present

## 2022-10-17 DIAGNOSIS — Z91040 Latex allergy status: Secondary | ICD-10-CM

## 2022-10-17 DIAGNOSIS — R1011 Right upper quadrant pain: Secondary | ICD-10-CM | POA: Diagnosis not present

## 2022-10-17 DIAGNOSIS — Z79899 Other long term (current) drug therapy: Secondary | ICD-10-CM

## 2022-10-17 DIAGNOSIS — R1013 Epigastric pain: Secondary | ICD-10-CM | POA: Diagnosis not present

## 2022-10-17 DIAGNOSIS — F17290 Nicotine dependence, other tobacco product, uncomplicated: Secondary | ICD-10-CM | POA: Diagnosis present

## 2022-10-17 DIAGNOSIS — K828 Other specified diseases of gallbladder: Secondary | ICD-10-CM | POA: Diagnosis not present

## 2022-10-17 DIAGNOSIS — K8065 Calculus of gallbladder and bile duct with chronic cholecystitis with obstruction: Secondary | ICD-10-CM | POA: Diagnosis not present

## 2022-10-17 DIAGNOSIS — R110 Nausea: Secondary | ICD-10-CM | POA: Diagnosis not present

## 2022-10-17 LAB — CBC
HCT: 44.7 % (ref 36.0–46.0)
Hemoglobin: 16 g/dL — ABNORMAL HIGH (ref 12.0–15.0)
MCH: 31.5 pg (ref 26.0–34.0)
MCHC: 35.8 g/dL (ref 30.0–36.0)
MCV: 88 fL (ref 80.0–100.0)
Platelets: 259 10*3/uL (ref 150–400)
RBC: 5.08 MIL/uL (ref 3.87–5.11)
RDW: 14 % (ref 11.5–15.5)
WBC: 17.3 10*3/uL — ABNORMAL HIGH (ref 4.0–10.5)
nRBC: 0 % (ref 0.0–0.2)

## 2022-10-17 LAB — URINALYSIS, ROUTINE W REFLEX MICROSCOPIC
Glucose, UA: 100 mg/dL — AB
Ketones, ur: 15 mg/dL — AB
Leukocytes,Ua: NEGATIVE
Nitrite: NEGATIVE
Protein, ur: NEGATIVE mg/dL
Specific Gravity, Urine: 1.02 (ref 1.005–1.030)
pH: 5.5 (ref 5.0–8.0)

## 2022-10-17 LAB — LIPID PANEL
Cholesterol: 299 mg/dL — ABNORMAL HIGH (ref 0–200)
HDL: 92 mg/dL (ref >40)
LDL Cholesterol: 185 mg/dL — ABNORMAL HIGH (ref 0–99)
Total CHOL/HDL Ratio: 3.3 RATIO
Triglycerides: 112 mg/dL (ref <150)
VLDL: 22 mg/dL (ref 0–40)

## 2022-10-17 LAB — COMPREHENSIVE METABOLIC PANEL
ALT: 613 U/L — ABNORMAL HIGH (ref 0–44)
AST: 285 U/L — ABNORMAL HIGH (ref 15–41)
Albumin: 3.6 g/dL (ref 3.5–5.0)
Alkaline Phosphatase: 362 U/L — ABNORMAL HIGH (ref 38–126)
Anion gap: 16 — ABNORMAL HIGH (ref 5–15)
BUN: 15 mg/dL (ref 6–20)
CO2: 20 mmol/L — ABNORMAL LOW (ref 22–32)
Calcium: 9.1 mg/dL (ref 8.9–10.3)
Chloride: 95 mmol/L — ABNORMAL LOW (ref 98–111)
Creatinine, Ser: 0.81 mg/dL (ref 0.44–1.00)
GFR, Estimated: >60 mL/min (ref >60)
Glucose, Bld: 117 mg/dL — ABNORMAL HIGH (ref 70–99)
Potassium: 3.4 mmol/L — ABNORMAL LOW (ref 3.5–5.1)
Sodium: 131 mmol/L — ABNORMAL LOW (ref 135–145)
Total Bilirubin: 6 mg/dL — ABNORMAL HIGH (ref 0.3–1.2)
Total Protein: 7.1 g/dL (ref 6.5–8.1)

## 2022-10-17 LAB — URINALYSIS, MICROSCOPIC (REFLEX)

## 2022-10-17 LAB — LIPASE, BLOOD: Lipase: 1478 U/L — ABNORMAL HIGH (ref 11–51)

## 2022-10-17 LAB — I-STAT BETA HCG BLOOD, ED (MC, WL, AP ONLY): I-stat hCG, quantitative: 6.1 m[IU]/mL — ABNORMAL HIGH (ref <5)

## 2022-10-17 MED ORDER — ACETAMINOPHEN 650 MG RE SUPP: 650.0000 mg | Freq: Four times a day (QID) | RECTAL | Status: DC | PRN

## 2022-10-17 MED ORDER — POTASSIUM CHLORIDE 10 MEQ/100ML IV SOLN
10.0000 meq | INTRAVENOUS | Status: AC
  Filled 2022-10-17: qty 100

## 2022-10-17 MED ORDER — ACETAMINOPHEN 325 MG PO TABS
650.0000 mg | ORAL_TABLET | Freq: Four times a day (QID) | ORAL | Status: DC | PRN
  Administered 2022-10-19 – 2022-10-23 (×4): 650 mg via ORAL
  Filled 2022-10-17 (×4): qty 2

## 2022-10-17 MED ORDER — SODIUM CHLORIDE 0.9 % IV SOLN: INTRAVENOUS | Status: DC

## 2022-10-17 MED ORDER — MAGNESIUM SULFATE 4 GM/100ML IV SOLN
4.0000 g | Freq: Once | INTRAVENOUS | Status: AC
  Administered 2022-10-17: 4 g via INTRAVENOUS
  Filled 2022-10-17: qty 100

## 2022-10-17 MED ORDER — HYDROMORPHONE HCL 1 MG/ML IJ SOLN
1.0000 mg | Freq: Once | INTRAMUSCULAR | Status: AC
  Administered 2022-10-17: 1 mg via INTRAVENOUS
  Filled 2022-10-17: qty 1

## 2022-10-17 MED ORDER — ONDANSETRON HCL 4 MG/2ML IJ SOLN
4.0000 mg | Freq: Four times a day (QID) | INTRAMUSCULAR | Status: DC | PRN
  Administered 2022-10-18 – 2022-10-23 (×3): 4 mg via INTRAVENOUS
  Filled 2022-10-17 (×3): qty 2

## 2022-10-17 MED ORDER — ALBUTEROL SULFATE (2.5 MG/3ML) 0.083% IN NEBU: 2.5000 mg | INHALATION_SOLUTION | RESPIRATORY_TRACT | Status: DC | PRN

## 2022-10-17 MED ORDER — POTASSIUM CHLORIDE 10 MEQ/100ML IV SOLN
10.0000 meq | INTRAVENOUS | Status: AC
  Administered 2022-10-17 (×4): 10 meq via INTRAVENOUS
  Filled 2022-10-17 (×3): qty 100

## 2022-10-17 MED ORDER — HYDROMORPHONE HCL 1 MG/ML IJ SOLN
0.5000 mg | INTRAMUSCULAR | Status: DC | PRN
  Administered 2022-10-17 – 2022-10-18 (×7): 1 mg via INTRAVENOUS
  Filled 2022-10-17 (×7): qty 1

## 2022-10-17 MED ORDER — HYDRALAZINE HCL 20 MG/ML IJ SOLN: 5.0000 mg | Freq: Four times a day (QID) | INTRAMUSCULAR | Status: DC | PRN

## 2022-10-17 MED ORDER — ENOXAPARIN SODIUM 60 MG/0.6ML IJ SOSY
0.5000 mg/kg | PREFILLED_SYRINGE | INTRAMUSCULAR | Status: DC
  Administered 2022-10-17: 60 mg via SUBCUTANEOUS
  Filled 2022-10-17: qty 0.6

## 2022-10-17 MED ORDER — IOHEXOL 350 MG/ML SOLN
100.0000 mL | Freq: Once | INTRAVENOUS | Status: AC | PRN
  Administered 2022-10-17: 100 mL via INTRAVENOUS

## 2022-10-17 MED ORDER — ONDANSETRON HCL 4 MG/2ML IJ SOLN
4.0000 mg | Freq: Once | INTRAMUSCULAR | Status: AC
  Administered 2022-10-17: 4 mg via INTRAVENOUS
  Filled 2022-10-17: qty 2

## 2022-10-17 MED ORDER — POTASSIUM CHLORIDE IN NACL 20-0.9 MEQ/L-% IV SOLN
INTRAVENOUS | Status: DC
  Filled 2022-10-17 (×2): qty 1000

## 2022-10-17 MED ORDER — ONDANSETRON HCL 4 MG PO TABS
4.0000 mg | ORAL_TABLET | Freq: Four times a day (QID) | ORAL | Status: DC | PRN
  Administered 2022-10-20: 4 mg via ORAL
  Filled 2022-10-17: qty 1

## 2022-10-17 NOTE — ED Triage Notes (Signed)
Abdominal Pain radiates to flank and shoulder, rates pain 15/10. Catherine [name] to PCP yesterday and had elevated enzymes   CBG 104

## 2022-10-17 NOTE — ED Notes (Signed)
ED TO INPATIENT HANDOFF REPORT  ED Nurse Name and Phone #: Sherryll Burger [place] Name/Age/Gender Genevie Ann 52 y.o. female Room/Bed: 016C/016C  Code Status   Code Status: Full Code  Home/SNF/Other Home Patient oriented to: self, place, time, and situation Is this baseline? Yes   Triage Complete: Triage complete  Chief Complaint Acute biliary pancreatitis [K85.10]  Triage Note Abdominal Pain radiates to flank and shoulder, rates pain 15/10. Jeris Penta to PCP yesterday and had elevated enzymes   CBG 104    Allergies Allergies  Allergen Reactions   Penicillins Anaphylaxis and Rash    Has patient had a PCN reaction causing immediate rash, facial/tongue/throat swelling, SOB or lightheadedness with hypotension:  Has patient had a PCN reaction causing severe rash involving mucus membranes or skin necrosis: Has patient had a PCN reaction that required hospitalization Has patient had a PCN reaction occurring within the last 10 years: If all of the above answers are "NO", then may proceed with Cephalosporin use.    Amoxicillin-Pot Clavulanate     REACTION: SEVERE STOMACH CRAMPING See Penicillin entry.    Divalproex Sodium Nausea And Vomiting   Latex Hives, Swelling and Rash    Swelling of throat    Level of Care/Admitting Diagnosis ED Disposition     ED Disposition  Admit   Condition  --   Comment  Hospital Area: [facility] [[id number]]  Level of Care: Med-Surg [16]  May admit patient to [facility] or [facility] if equivalent level of care is available:: No  Covid Evaluation: Asymptomatic - no recent exposure (last 10 days) testing not required  Diagnosis: Acute biliary pancreatitis [id number]  Admitting Physician: Bonnielee Haff [[id number]]  Attending Physician: Bonnielee Haff [id number]  Certification:: I certify this patient will need inpatient services for at least 2 midnights  Estimated Length of Stay: 5          B Medical/Surgery History Past  Medical History:  Diagnosis Date   Anxiety    Bipolar 1 disorder ([place])    Cystitis, interstitial    Depression    Hypertension    Migraine    Past Surgical History:  Procedure Laterality Date   ABDOMINAL HYSTERECTOMY     CYSTOSCOPY     TOTAL ABDOMINAL HYSTERECTOMY W/ BILATERAL SALPINGOOPHORECTOMY       A IV Location/Drains/Wounds Patient Lines/Drains/Airways Status     Active Line/Drains/Airways     Name Placement date Placement time Site Days   Peripheral IV 10/17/22 20 G Anterior;Left Antecubital 10/17/22  0925  Antecubital  less than 1            Intake/Output Last 24 hours No intake or output data in the 24 hours ending 10/17/22 1132  Labs/Imaging Results for orders placed or performed during the hospital encounter of 10/17/22 (from the past 48 hour(s))  Lipase, blood     Status: Abnormal   Collection Time: 10/17/22  9:28 AM  Result Value Ref Range   Lipase 1,478 (H) 11 - 51 U/L    Comment: RESULT CONFIRMED BY MANUAL DILUTION Performed at [facility], [address] [place], [place] [address]   Comprehensive metabolic panel     Status: Abnormal   Collection Time: 10/17/22  9:28 AM  Result Value Ref Range   Sodium 131 (L) 135 - 145 mmol/L   Potassium 3.4 (L) 3.5 - 5.1 mmol/L    Comment: HEMOLYSIS AT THIS LEVEL MAY AFFECT RESULT   Chloride 95 (L) 98 -  111 mmol/L   CO2 20 (L) 22 - 32 mmol/L   Glucose, Bld 117 (H) 70 - 99 mg/dL    Comment: Glucose reference range applies only to samples taken after fasting for at least 8 hours.   BUN 15 6 - 20 mg/dL   Creatinine, Ser 0.81 0.44 - 1.00 mg/dL   Calcium 9.1 8.9 - 10.3 mg/dL   Total Protein 7.1 6.5 - 8.1 g/dL   Albumin 3.6 3.5 - 5.0 g/dL   AST 285 (H) 15 - 41 U/L    Comment: HEMOLYSIS AT THIS LEVEL MAY AFFECT RESULT   ALT 613 (H) 0 - 44 U/L    Comment: HEMOLYSIS AT THIS LEVEL MAY AFFECT RESULT   Alkaline Phosphatase 362 (H) 38 - 126 U/L   Total Bilirubin 6.0 (H) 0.3 - 1.2 mg/dL    Comment: HEMOLYSIS  AT THIS LEVEL MAY AFFECT RESULT   GFR, Estimated >60 >60 mL/min    Comment: (NOTE) Calculated using the CKD-EPI Creatinine Equation (2021)    Anion gap 16 (H) 5 - 15    Comment: Performed at [facility], [place] [address] [place], [place] [address]  CBC     Status: Abnormal   Collection Time: 10/17/22  9:28 AM  Result Value Ref Range   WBC 17.3 (H) 4.0 - 10.5 K/uL   RBC 5.08 3.87 - 5.11 MIL/uL   Hemoglobin 16.0 (H) 12.0 - 15.0 g/dL   HCT 44.7 36.0 - 46.0 %   MCV 88.0 80.0 - 100.0 fL   MCH 31.5 26.0 - 34.0 pg   MCHC 35.8 30.0 - 36.0 g/dL   RDW 14.0 11.5 - 15.5 %   Platelets 259 150 - 400 K/uL   nRBC 0.0 0.0 - 0.2 %    Comment: Performed at [facility], [place] [address], [place], [place] [address]  I-Stat beta hCG blood, ED     Status: Abnormal   Collection Time: 10/17/22  9:35 AM  Result Value Ref Range   I-stat hCG, quantitative 6.1 (H) <5 mIU/mL   Comment 3            Comment:   GEST. AGE      CONC.  (mIU/mL)   <=1 WEEK        5 - 50     2 WEEKS       50 - 500     3 WEEKS       100 - 10,000     4 WEEKS     1,000 - 30,000        FEMALE AND NON-PREGNANT FEMALE:     LESS THAN 5 mIU/mL    US Abdomen Limited RUQ (LIVER/GB)  Result Date: 10/17/2022 CLINICAL DATA:  Right upper quadrant pain. EXAM: ULTRASOUND ABDOMEN LIMITED RIGHT UPPER QUADRANT COMPARISON:  08/22/2018. FINDINGS: Gallbladder: Multiple gallstones. Small amount of sludge. No wall thickening or pericholecystic fluid. Common bile duct: Diameter: Dilated to 13 mm.  No visualized duct stone. Liver: Increased parenchymal echogenicity. Mild intrahepatic bile duct dilation. No liver mass. Portal vein is patent on color Doppler imaging with normal direction of blood flow towards the liver. Other: Trace ascites. IMPRESSION: 1. Dilated common bile duct. Mild intrahepatic bile duct dilation. Duct dilation is new compared to the abdomen ultrasound from 08/22/2018. Consider a distal duct obstruction/stone, which could be  further assessed with either ERCP or MRCP. 2. Multiple mobile gallstones with some gallbladder sludge, but no evidence of acute cholecystitis. 3. Increased  liver parenchymal echogenicity suggests hepatic steatosis. Electronically Signed   By: Lajean Manes M.D.   On: 10/17/2022 10:15    Pending Labs Unresulted Labs (From admission, onward)     Start     Ordered   10/18/22 0500  HIV Antibody (routine testing w rflx)  (HIV Antibody (Routine testing w reflex) panel)  Tomorrow morning,   R        10/17/22 1122   10/18/22 0500  Hepatitis panel, acute  Tomorrow morning,   R        10/17/22 1122   10/18/22 0500  Comprehensive metabolic panel  Daily,   R      10/17/22 1122   10/18/22 0500  CBC  Daily,   R      10/17/22 1122   10/18/22 0500  Magnesium  Tomorrow morning,   R        10/17/22 1122   10/17/22 0916  Urinalysis, Routine w reflex microscopic -Urine, Random  Once,   URGENT       Question:  Specimen Source  Answer:  Urine, Random   10/17/22 0915            Vitals/Pain Today's Vitals   10/17/22 1030 10/17/22 1045 10/17/22 1046 10/17/22 1100  BP: (!) 122/93 (!) 118/91  (!) 119/91  Pulse: 99 (!) 102 97 98  Resp: 20 (!) 21 18 17  $ Temp:      TempSrc:      SpO2: 95% 96% 96% 96%  Weight:      Height:      PainSc:        Isolation Precautions No active isolations  Medications Medications  enoxaparin (LOVENOX) injection 60 mg (has no administration in time range)  0.9 % NaCl with KCl 20 mEq/ L  infusion (has no administration in time range)  acetaminophen (TYLENOL) tablet 650 mg (has no administration in time range)    Or  acetaminophen (TYLENOL) suppository 650 mg (has no administration in time range)  HYDROmorphone (DILAUDID) injection 0.5-1 mg (has no administration in time range)  ondansetron (ZOFRAN) tablet 4 mg (has no administration in time range)    Or  ondansetron (ZOFRAN) injection 4 mg (has no administration in time range)  albuterol (PROVENTIL) (2.5 MG/3ML)  0.083% nebulizer solution 2.5 mg (has no administration in time range)  hydrALAZINE (APRESOLINE) injection 5 mg (has no administration in time range)  HYDROmorphone (DILAUDID) injection 1 mg (1 mg Intravenous Given 10/17/22 0945)  ondansetron (ZOFRAN) injection 4 mg (4 mg Intravenous Given 10/17/22 0945)  iohexol (OMNIPAQUE) 350 MG/ML injection 100 mL (100 mLs Intravenous Contrast Given 10/17/22 1130)    Mobility walks     Focused Assessments    R Recommendations: See Admitting Provider Note  Report given to:   Additional Notes:

## 2022-10-17 NOTE — ED Provider Notes (Signed)
[facility] Provider Note   CSN: [id number] Arrival date & time: 10/17/22  [id number]     History {Add pertinent medical, surgical, social history, OB history to HPI:1} Chief Complaint  Patient presents with   Abdominal Pain    Catherine [name] is a 52 y.o. female.   Abdominal Pain  This is a 52 year old female with no prior abdominal surgical history other than a hysterectomy, she states that she used to drink alcohol heavily but it was over 4 years ago and now she just has the occasional drink.  She actually went to her doctor's office yesterday, she was not having any abdominal pain, she had some blood work done and was shared that she had some elevated liver enzymes.  Last night she had rather acute onset of abdominal discomfort radiating to the right shoulder, it is making her nauseated and dry heaving but no fevers or chills.  Pain is quite severe, she arrives by paramedic transport.  Noted to be borderline tachycardic.  Blood sugar was normal.    Home Medications Prior to Admission medications   Medication Sig Start Date End Date Taking? Authorizing Provider  amLODipine (NORVASC) 2.5 MG tablet Take 1 tablet (2.5 mg total) by mouth daily. 05/18/22   Dixie Dials, MD  Ascorbic Acid (VITAMIN C PO) Take 2 tablets by mouth daily.    [provider]  aspirin EC 81 MG tablet Take 1 tablet (81 mg total) by mouth daily. Swallow whole. 05/17/22   Dixie Dials, MD  aspirin-acetaminophen-caffeine (EXCEDRIN MIGRAINE) [phone] MG tablet Take 2 tablets by mouth 2 (two) times daily as needed for headache or migraine.    [provider]  ibuprofen (ADVIL) 200 MG tablet Take 400-600 mg by mouth as needed (back pain).    [provider]  lisinopril-hydrochlorothiazide (ZESTORETIC) 10-12.5 MG tablet Take 1 tablet by mouth daily. 02/06/22   [provider]  metoprolol succinate (TOPROL-XL) 25 MG 24 hr tablet Take 1 tablet  (25 mg total) by mouth every evening. 05/17/22   Dixie Dials, MD  pantoprazole (PROTONIX) 40 MG tablet Take 1 tablet (40 mg total) by mouth daily. 10/11/22   Prosperi, Christian H, PA-C  potassium chloride 20 MEQ TBCR Take 1 tablet (20 mEq total) by mouth daily. 10/11/22   Prosperi, Christian H, PA-C  sucralfate (CARAFATE) 1 g tablet Take 1 tablet (1 g total) by mouth 4 (four) times daily -  with meals and at bedtime. 10/11/22   Prosperi, Christian H, PA-C      Allergies    Penicillins, Amoxicillin-pot clavulanate, Divalproex sodium, and Latex    Review of Systems   Review of Systems  Gastrointestinal:  Positive for abdominal pain.  All other systems reviewed and are negative.   Physical Exam Updated Vital Signs BP (!) 118/91   Pulse 97   Temp 98.4 F (36.9 C) (Oral)   Resp 18   Ht 1.753 m (5' 9"$ )   Wt 119.7 kg   SpO2 96%   BMI 38.99 kg/m  Physical Exam Vitals and nursing note reviewed.  Constitutional:      General: She is not in acute distress.    Appearance: She is well-developed.  HENT:     Head: Normocephalic and atraumatic.     Mouth/Throat:     Pharynx: No oropharyngeal exudate.  Eyes:     General: No scleral icterus.       Right eye: No discharge.  Left eye: No discharge.     Conjunctiva/sclera: Conjunctivae normal.     Pupils: Pupils are equal, round, and reactive to light.  Neck:     Thyroid: No thyromegaly.     Vascular: No JVD.  Cardiovascular:     Rate and Rhythm: Regular rhythm. Tachycardia present.     Heart sounds: Normal heart sounds. No murmur heard.    No friction rub. No gallop.  Pulmonary:     Effort: Pulmonary effort is normal. No respiratory distress.     Breath sounds: Normal breath sounds. No wheezing or rales.  Abdominal:     General: Bowel sounds are normal. There is no distension.     Palpations: Abdomen is soft. There is no mass.     Tenderness: There is abdominal tenderness in the right upper quadrant, epigastric area and  periumbilical area.  Musculoskeletal:        General: No tenderness. Normal range of motion.     Cervical back: Normal range of motion and neck supple.     Right lower leg: No edema.     Left lower leg: No edema.  Lymphadenopathy:     Cervical: No cervical adenopathy.  Skin:    General: Skin is warm and dry.     Findings: No erythema or rash.  Neurological:     Mental Status: She is alert.     Coordination: Coordination normal.  Psychiatric:        Behavior: Behavior normal.     ED Results / Procedures / Treatments   Labs (all labs ordered are listed, but only abnormal results are displayed) Labs Reviewed  LIPASE, BLOOD - Abnormal; Notable for the following components:      Result Value   Lipase 1,478 (*)    All other components within normal limits  COMPREHENSIVE METABOLIC PANEL - Abnormal; Notable for the following components:   Sodium 131 (*)    Potassium 3.4 (*)    Chloride 95 (*)    CO2 20 (*)    Glucose, Bld 117 (*)    AST 285 (*)    ALT 613 (*)    Alkaline Phosphatase 362 (*)    Total Bilirubin 6.0 (*)    Anion gap 16 (*)    All other components within normal limits  CBC - Abnormal; Notable for the following components:   WBC 17.3 (*)    Hemoglobin 16.0 (*)    All other components within normal limits  I-STAT BETA HCG BLOOD, ED ([facility], [facility], [facility] ONLY) - Abnormal; Notable for the following components:   I-stat hCG, quantitative 6.1 (*)    All other components within normal limits  URINALYSIS, ROUTINE W REFLEX MICROSCOPIC    EKG EKG Interpretation  Date/Time:  Saturday October 17 2022 09:22:05 EST Ventricular Rate:  97 PR Interval:  157 QRS Duration: 77 QT Interval:  410 QTC Calculation: 521 R Axis:   64 Text Interpretation: Sinus rhythm Low voltage, precordial leads Borderline T abnormalities, anterior leads Prolonged QT interval Since last tracing T wave inversion have improved in precordial leads Confirmed by Noemi Chapel [phone]) on 10/17/2022 9:27:02  AM  Radiology US Abdomen Limited RUQ (LIVER/GB)  Result Date: 10/17/2022 CLINICAL DATA:  Right upper quadrant pain. EXAM: ULTRASOUND ABDOMEN LIMITED RIGHT UPPER QUADRANT COMPARISON:  08/22/2018. FINDINGS: Gallbladder: Multiple gallstones. Small amount of sludge. No wall thickening or pericholecystic fluid. Common bile duct: Diameter: Dilated to 13 mm.  No visualized duct stone. Liver: Increased parenchymal echogenicity. Mild intrahepatic bile duct  dilation. No liver mass. Portal vein is patent on color Doppler imaging with normal direction of blood flow towards the liver. Other: Trace ascites. IMPRESSION: 1. Dilated common bile duct. Mild intrahepatic bile duct dilation. Duct dilation is new compared to the abdomen ultrasound from 08/22/2018. Consider a distal duct obstruction/stone, which could be further assessed with either ERCP or MRCP. 2. Multiple mobile gallstones with some gallbladder sludge, but no evidence of acute cholecystitis. 3. Increased liver parenchymal echogenicity suggests hepatic steatosis. Electronically Signed   By: Lajean Manes M.D.   On: 10/17/2022 10:15    Procedures Procedures  {Document cardiac monitor, telemetry assessment procedure when appropriate:1}  Medications Ordered in ED Medications  0.9 %  sodium chloride infusion ( Intravenous New Bag/Given 10/17/22 0947)  0.9 %  sodium chloride infusion ( Intravenous Not Given 10/17/22 1047)  HYDROmorphone (DILAUDID) injection 1 mg (1 mg Intravenous Given 10/17/22 0945)  ondansetron (ZOFRAN) injection 4 mg (4 mg Intravenous Given 10/17/22 0945)    ED Course/ Medical Decision Making/ A&P   {   Click here for ABCD2, HEART and other calculatorsREFRESH Note before signing :1}                          Medical Decision Making Amount and/or Complexity of Data Reviewed Labs: ordered. Radiology: ordered.  Risk Prescription drug management. Decision regarding hospitalization.   This patient presents to the ED for concern of  abdominal pain, this involves an extensive number of treatment options, and is a complaint that carries with it a high risk of complications and morbidity.  The differential diagnosis includes cholecystitis, pancreatitis, choledocholithiasis, less likely to be kidney stone though the patient has had a history of that in the past.   Co morbidities that complicate the patient evaluation  Obesity, prior alcohol use, hypertension   Additional history obtained:  Additional history obtained from electronic medical record External records from outside source obtained and reviewed including lab workup done by the patient's primary care doctor yesterday with a bilirubin of 3.7 and LFTs in the several 100 range   Lab Tests:  I Ordered, and personally interpreted labs.  The pertinent results include: Bilirubin of 6, elevated LFTs and a lipase of over 1400, my interpretation of these results is that the patient likely has gallstone pancreatitis.  White blood cell count of 17,000 further confirms the inflammatory response   Imaging Studies ordered:  I ordered imaging studies including ultrasound of the right upper quadrant and gallbladder I independently visualized and interpreted imaging which showed dilated ducts consistent with obstructive biliary process I agree with the radiologist interpretation   Cardiac Monitoring: / EKG:  The patient was maintained on a cardiac monitor.  I personally viewed and interpreted the cardiac monitored which showed an underlying rhythm of: Normal sinus rhythm with a borderline tachycardia   Consultations Obtained:  I requested consultation with the history neurologist and hospitalist,  and discussed lab and imaging findings as well as pertinent plan - they recommend: Admission, consideration for ERCP   Problem List / ED Course / Critical interventions / Medication management  The patient was kept n.p.o., she is significantly better with a dose of  hydromorphone and Zofran with IV fluids, suspect she has gallstone pancreatitis and may need ERCP. I ordered medication including hydromorphone for pain Reevaluation of the patient after these medicines showed that the patient improved I have reviewed the patients home medicines and have made adjustments as needed  Social Determinants of Health:  Prior alcohol use   Test / Admission - Considered:  Will admit to the hospital   {Document critical care time when appropriate:1} {Document review of labs and clinical decision tools ie heart score, Chads2Vasc2 etc:1}  {Document your independent review of radiology images, and any outside records:1} {Document your discussion with family members, caretakers, and with consultants:1} {Document social determinants of health affecting pt's care:1} {Document your decision making why or why not admission, treatments were needed:1} Final Clinical Impression(s) / ED Diagnoses Final diagnoses:  Gall stone pancreatitis    Rx / DC Orders ED Discharge Orders     None

## 2022-10-17 NOTE — ED Notes (Signed)
2W aware patient enroute to 2W 11-no questions

## 2022-10-17 NOTE — Consult Note (Signed)
Referring Provider: [facility] Primary Care Physician:  Aura Dials, MD Primary Gastroenterologist: [facility]/Dr. Oletta Lamas  Reason for Consultation: Abdominal pain  HPI: Catherine [name] is a 52 y.o. female with past medical history of anxiety and bipolar disorder, presented to the hospital for further evaluation of abnormal LFTs.  Also started noticing abdominal pain and nausea yesterday.  Patient with mildly elevated LFTs since October 11, 2022.  AST of 246 and ALT was 126. Normal T. bili and alk phos.  Blood work today showed AST 285, ALT 613, alk phos 362 and T. bili of 6.  CBC showed leukocytosis with white count of 17.3.  elevated lipase at 1478.  CT abdomen pelvis with IV contrast showed moderate intrahepatic and common bile duct dilation without any obvious CBD stone.  Also showed inflammation around pancreas concerning for acute pancreatitis.  Ultrasound showed dilated CBD up to 13 mm with multiple gallstone.  Patient has been having intermittent substernal pressure and the chest pain sensation since August 2023.  Yesterday she started noticing epigastric abdominal pain radiating towards back along with nausea.  Denies any diarrhea or constipation.  Denies similar symptoms in the past. Past Medical History:  Diagnosis Date   Anxiety    Bipolar 1 disorder ([place])    Cystitis, interstitial    Depression    Hypertension    Migraine     Past Surgical History:  Procedure Laterality Date   ABDOMINAL HYSTERECTOMY     CYSTOSCOPY     TOTAL ABDOMINAL HYSTERECTOMY W/ BILATERAL SALPINGOOPHORECTOMY      Prior to Admission medications   Medication Sig Start Date End Date Taking? Authorizing Provider  amLODipine (NORVASC) 2.5 MG tablet Take 1 tablet (2.5 mg total) by mouth daily. 05/18/22   Dixie Dials, MD  Ascorbic Acid (VITAMIN C PO) Take 2 tablets by mouth daily.    [provider]  aspirin EC 81 MG tablet Take 1 tablet (81 mg total) by mouth daily. Swallow whole.  05/17/22   Dixie Dials, MD  aspirin-acetaminophen-caffeine (EXCEDRIN MIGRAINE) [phone] MG tablet Take 2 tablets by mouth 2 (two) times daily as needed for headache or migraine.    [provider]  ibuprofen (ADVIL) 200 MG tablet Take 400-600 mg by mouth as needed (back pain).    [provider]  lisinopril-hydrochlorothiazide (ZESTORETIC) 10-12.5 MG tablet Take 1 tablet by mouth daily. 02/06/22   [provider]  metoprolol succinate (TOPROL-XL) 25 MG 24 hr tablet Take 1 tablet (25 mg total) by mouth every evening. 05/17/22   Dixie Dials, MD  pantoprazole (PROTONIX) 40 MG tablet Take 1 tablet (40 mg total) by mouth daily. 10/11/22   Prosperi, Christian H, PA-C  potassium chloride 20 MEQ TBCR Take 1 tablet (20 mEq total) by mouth daily. 10/11/22   Prosperi, Christian H, PA-C  sucralfate (CARAFATE) 1 g tablet Take 1 tablet (1 g total) by mouth 4 (four) times daily -  with meals and at bedtime. 10/11/22   Prosperi, Christian H, PA-C    Scheduled Meds:  enoxaparin (LOVENOX) injection  0.5 mg/kg Subcutaneous Q24H   Continuous Infusions:  0.9 % NaCl with KCl 20 mEq / L     magnesium sulfate bolus IVPB     potassium chloride     PRN Meds:.acetaminophen **OR** acetaminophen, albuterol, hydrALAZINE, HYDROmorphone (DILAUDID) injection, ondansetron **OR** ondansetron (ZOFRAN) IV  Allergies as of 10/17/2022 - Review Complete 10/17/2022  Allergen Reaction Noted   Penicillins Anaphylaxis and Rash 11/05/2011   Amoxicillin-pot clavulanate  Divalproex sodium Nausea And Vomiting    Latex Hives, Swelling, and Rash     Family History  Problem Relation Age of Onset   Sudden death Mother        suicide   Hypothyroidism Sister     Social History   Socioeconomic History   Marital status: Divorced    Spouse name: Not on file   Number of children: Not on file   Years of education: Not on file   Highest education level: Not on file  Occupational History   Not on file   Tobacco Use   Smoking status: Some Days    Types: E-cigarettes   Smokeless tobacco: Never  Vaping Use   Vaping Use: Some days  Substance and Sexual Activity   Alcohol use: Yes    Comment: social   Drug use: No   Sexual activity: Never  Other Topics Concern   Not on file  Social History Narrative   Working - Works at [employer]   Two kids  - do not live with her. 21 and 24      Social Determinants of Health   Financial Resource Strain: Not on file  Food Insecurity: Not on file  Transportation Needs: Not on file  Physical Activity: Not on file  Stress: Not on file  Social Connections: Not on file  Intimate Partner Violence: Not on file    Review of Systems: All negative except as stated above in HPI.  Physical Exam: Vital signs: Vitals:   10/17/22 1046 10/17/22 1100  BP:  (!) 119/91  Pulse: 97 98  Resp: 18 17  Temp:    SpO2: 96% 96%     General:   Alert,  Well-developed, well-nourished, pleasant and cooperative in NAD Mild scleral icterus noted Lungs: No visible respiratory distress Heart:  Regular rate and rhythm; no murmurs, clicks, rubs,  or gallops. Abdomen: Gastric tenderness to palpation with guarding, no peritoneal signs, bowel sounds present, abdomen is otherwise soft. Neuro -alert and oriented x 3 Psych - mood and Affect normal Rectal:  Deferred  GI:  Lab Results: Recent Labs    10/17/22 0928  WBC 17.3*  HGB 16.0*  HCT 44.7  PLT 259   BMET Recent Labs    10/17/22 0928  NA 131*  K 3.4*  CL 95*  CO2 20*  GLUCOSE 117*  BUN 15  CREATININE 0.81  CALCIUM 9.1   LFT Recent Labs    10/17/22 0928  PROT 7.1  ALBUMIN 3.6  AST 285*  ALT 613*  ALKPHOS 362*  BILITOT 6.0*   PT/INR No results for input(s): "LABPROT", "INR" in the last 72 hours.   Studies/Results: CT ABDOMEN PELVIS W CONTRAST  Result Date: 10/17/2022 CLINICAL DATA:  Biliary dilatation seen on ultrasound examination. Right-sided abdominal pain.  EXAM: CT ABDOMEN AND PELVIS WITH CONTRAST TECHNIQUE: Multidetector CT imaging of the abdomen and pelvis was performed using the standard protocol following bolus administration of intravenous contrast. RADIATION DOSE REDUCTION: This exam was performed according to the departmental dose-optimization program which includes automated exposure control, adjustment of the mA and/or kV according to patient size and/or use of iterative reconstruction technique. CONTRAST:  175m OMNIPAQUE IOHEXOL 350 MG/ML SOLN COMPARISON:  Abdominal ultrasound, same date. FINDINGS: Lower chest: Status breathing motion artifact but no pulmonary lesions or pleural effusions. Heart is normal in size. No pericardial effusion. Small hiatal hernia. Hepatobiliary: Moderate intrahepatic biliary dilatation and common bile duct dilatation. Gallbladder demonstrates mild wall  thickening. Gallstones are difficult to identified but were present on the ultrasound. No hepatic lesions. The portal and hepatic veins are patent. No obvious obstructing common bile duct stone but recommend MRI abdomen/MRCP without with contrast for further evaluation. Pancreas: Inflammation around the pancreas but no CT findings to suggest acute pancreatitis. No pancreatic ductal dilatation or pancreatic head mass to account for the patient's biliary obstruction. No ampullary lesion is identified either. Spleen: Normal size.  No focal lesions. Adrenals/Urinary Tract: Adrenal glands and kidneys are normal. The bladder is normal. Stomach/Bowel: The stomach is unremarkable. There is inflammation surrounding the second and third portions of the duodenum but I do not think this is a primary duodenal process. The small bowel and colon are unremarkable. The terminal ileum and appendix are normal. No colonic mass or obstruction. Vascular/Lymphatic: The aorta and branch vessels are patent. The major venous structures are patent. No mesenteric or retroperitoneal mass or adenopathy. Small  scattered lymph nodes are noted. Reproductive: Surgically absent. Other: No pelvic mass or adenopathy. Small amount of fluid in the anterior pararenal spaces and also in the left pericolic gutter. No inguinal mass or adenopathy. No abdominal wall hernia or subcutaneous lesions. Musculoskeletal: No significant bony findings. IMPRESSION: 1. Moderate intrahepatic biliary dilatation and common bile duct dilatation. No obvious obstructing common bile duct stone but this is still most likely. No pancreatic head mass or ampullary lesion. Recommend MRI abdomen/MRCP without with contrast for further evaluation. 2. Inflammation around the pancreas and duodenum but no CT findings to suggest acute pancreatitis. 3. Mild gallbladder wall thickening but no pericholecystic fluid. Gallstones were noted on the abdominal ultrasound examination. 4. No other significant abdominal/pelvic findings. Electronically Signed   By: Marijo Sanes M.D.   On: 10/17/2022 11:45   US Abdomen Limited RUQ (LIVER/GB)  Result Date: 10/17/2022 CLINICAL DATA:  Right upper quadrant pain. EXAM: ULTRASOUND ABDOMEN LIMITED RIGHT UPPER QUADRANT COMPARISON:  08/22/2018. FINDINGS: Gallbladder: Multiple gallstones. Small amount of sludge. No wall thickening or pericholecystic fluid. Common bile duct: Diameter: Dilated to 13 mm.  No visualized duct stone. Liver: Increased parenchymal echogenicity. Mild intrahepatic bile duct dilation. No liver mass. Portal vein is patent on color Doppler imaging with normal direction of blood flow towards the liver. Other: Trace ascites. IMPRESSION: 1. Dilated common bile duct. Mild intrahepatic bile duct dilation. Duct dilation is new compared to the abdomen ultrasound from 08/22/2018. Consider a distal duct obstruction/stone, which could be further assessed with either ERCP or MRCP. 2. Multiple mobile gallstones with some gallbladder sludge, but no evidence of acute cholecystitis. 3. Increased liver parenchymal echogenicity  suggests hepatic steatosis. Electronically Signed   By: Lajean Manes M.D.   On: 10/17/2022 10:15    Impression/Plan: -Epigastric abdominal pain radiating to back.  Elevated lipase.  CT scan showing pancreatitis as well as gallstone and dilated CBD.  Likely gallstone pancreatitis.  Patient with occasional alcohol use. -Abnormal LFTs with jaundice.  Likely from above.  Need to rule out CBD stone given biliary dilatation seen on the ultrasound and CT scan.  No obvious choledocholithiasis seen on the CT scan.  Recommendations -------------------------- -Keep n.p.o. for now -MRI MRCP for further evaluation -Start IV normal saline at 250 cc/h for pancreatitis. -Check lipid panel -Further management based on MRI MRCP findings.    LOS: 0 days   Otis Brace  MD, FACP 10/17/2022, 12:36 PM  Contact #  [phone]

## 2022-10-17 NOTE — H&P (Signed)
[facility] History and Physical  Catherine [name] [id number] DOB: [birth year] DOA: 10/17/2022   PCP: Aura Dials, MD  Specialists: None  Chief Complaint: Abdominal pain  HPI: Catherine [name] is a 52 y.o. female with past medical history of essential hypertension, previous history of alcoholism but none in the last 4 years who initially presented to the emergency department about a week ago with concern for chest pain.  During that evaluation she was found to have abnormal LFTs.  However it does not appear the patient was complaining of abdominal pain at that time.  She was asked to see her primary care provider.  She went to her primary care provider's office yesterday and had blood work done which showed elevated liver enzymes.  And then overnight she started developing abdominal discomfort radiating to the right shoulder.  This was associated with nausea and dry heaves.  No fever or chills.  Abdominal pain was 10 out of 10 in intensity.  She has never had the symptoms previously.  She decided to seek attention.   In the emergency department she was found to have elevated lipase level.  Ultrasound raised concern for cholelithiasis.  Patient will be hospitalized for further management.  Home Medications: Prior to Admission medications   Medication Sig Start Date End Date Taking? Authorizing Provider  amLODipine (NORVASC) 2.5 MG tablet Take 1 tablet (2.5 mg total) by mouth daily. 05/18/22   Dixie Dials, MD  Ascorbic Acid (VITAMIN C PO) Take 2 tablets by mouth daily.    [provider]  aspirin EC 81 MG tablet Take 1 tablet (81 mg total) by mouth daily. Swallow whole. 05/17/22   Dixie Dials, MD  aspirin-acetaminophen-caffeine (EXCEDRIN MIGRAINE) [phone] MG tablet Take 2 tablets by mouth 2 (two) times daily as needed for headache or migraine.    [provider]  ibuprofen (ADVIL) 200 MG tablet Take 400-600 mg by mouth as needed (back pain).    [provider]  lisinopril-hydrochlorothiazide (ZESTORETIC) 10-12.5 MG tablet Take 1 tablet by mouth daily. 02/06/22   [provider]  metoprolol succinate (TOPROL-XL) 25 MG 24 hr tablet Take 1 tablet (25 mg total) by mouth every evening. 05/17/22   Dixie Dials, MD  pantoprazole (PROTONIX) 40 MG tablet Take 1 tablet (40 mg total) by mouth daily. 10/11/22   Prosperi, Christian H, PA-C  potassium chloride 20 MEQ TBCR Take 1 tablet (20 mEq total) by mouth daily. 10/11/22   Prosperi, Christian H, PA-C  sucralfate (CARAFATE) 1 g tablet Take 1 tablet (1 g total) by mouth 4 (four) times daily -  with meals and at bedtime. 10/11/22   Prosperi, Darrick Meigs H, PA-C    Allergies:  Allergies  Allergen Reactions   Penicillins Anaphylaxis and Rash    Has patient had a PCN reaction causing immediate rash, facial/tongue/throat swelling, SOB or lightheadedness with hypotension:  Has patient had a PCN reaction causing severe rash involving mucus membranes or skin necrosis: Has patient had a PCN reaction that required hospitalization Has patient had a PCN reaction occurring within the last 10 years: If all of the above answers are "NO", then may proceed with Cephalosporin use.    Amoxicillin-Pot Clavulanate     REACTION: SEVERE STOMACH CRAMPING See Penicillin entry.    Divalproex Sodium Nausea And Vomiting   Latex Hives, Swelling and Rash    Swelling of throat    Past Medical History: Past Medical History:  Diagnosis Date   Anxiety    Bipolar 1  disorder ([place])    Cystitis, interstitial    Depression    Hypertension    Migraine     Past Surgical History:  Procedure Laterality Date   ABDOMINAL HYSTERECTOMY     CYSTOSCOPY     TOTAL ABDOMINAL HYSTERECTOMY W/ BILATERAL SALPINGOOPHORECTOMY      Social History: She uses vape.  No cigarette smoking about 3 years.  Used to be a heavy alcohol drinker but not in the last 4 years.  No illicit drug use.  Family History:  Family History  Problem  Relation Age of Onset   Sudden death Mother        suicide   Hypothyroidism Sister      Review of Systems - History obtained from the patient General ROS: positive for  - fatigue Psychological ROS: negative Ophthalmic ROS: negative ENT ROS: negative Allergy and Immunology ROS: negative Hematological and Lymphatic ROS: negative Endocrine ROS: negative Respiratory ROS: no cough, shortness of breath, or wheezing Cardiovascular ROS: Has had on and off for chest pressure since August which has been evaluated in the outpatient setting Gastrointestinal ROS: As in HPI Genito-Urinary ROS: no dysuria, trouble voiding, or hematuria Musculoskeletal ROS: negative Neurological ROS: no TIA or stroke symptoms Dermatological ROS: negative  Physical Examination  Vitals:   10/17/22 1030 10/17/22 1045 10/17/22 1046 10/17/22 1100  BP: (!) 122/93 (!) 118/91  (!) 119/91  Pulse: 99 (!) 102 97 98  Resp: 20 (!) 21 18 17  $ Temp:      TempSrc:      SpO2: 95% 96% 96% 96%  Weight:      Height:        BP (!) 119/91   Pulse 98   Temp 98.4 F (36.9 C) (Oral)   Resp 17   Ht 5' 9"$  (1.753 m)   Wt 119.7 kg   SpO2 96%   BMI 38.99 kg/m   General appearance: alert, cooperative, appears stated age, and no distress Head: Normocephalic, without obvious abnormality, atraumatic Eyes: Icteric sclera.  PERRL, EOM's intact.  Throat: lips, mucosa, and tongue normal; teeth and gums normal Neck: no adenopathy, no carotid bruit, no JVD, supple, symmetrical, trachea midline, and thyroid not enlarged, symmetric, no tenderness/mass/nodules Resp: clear to auscultation bilaterally Cardio: regular rate and rhythm, S1, S2 normal, no murmur, click, rub or gallop GI: Abdomen is soft.  Tender in the epigastric as well as right upper quadrant.  No rebound rigidity or guarding.  No masses organomegaly.  Bowel sounds absent. Extremities: extremities normal, atraumatic, no cyanosis or edema Pulses: 2+ and symmetric Skin: Skin  color, texture, turgor normal. No rashes or lesions Lymph nodes: Cervical, supraclavicular, and axillary nodes normal. Neurologic: Alert and oriented x 3.  Cranial nerves II to XII intact.  Motor strength equal bilateral upper and lower extremities.    Labs on Admission: I have personally reviewed following labs and imaging studies  CBC: Recent Labs  Lab 10/11/22 1240 10/17/22 0928  WBC 12.8* 17.3*  NEUTROABS 10.2*  --   HGB 12.1 16.0*  HCT 35.3* 44.7  MCV 90.1 88.0  PLT 264 [id number]   Basic Metabolic Panel: Recent Labs  Lab 10/11/22 1240 10/11/22 1409 10/17/22 0928  NA 137  --  131*  K 2.8*  --  3.4*  CL 106  --  95*  CO2 21*  --  20*  GLUCOSE 81  --  117*  BUN 11  --  15  CREATININE 0.57  --  0.81  CALCIUM 8.2*  --  9.1  MG  --  1.5*  --    GFR: Estimated Creatinine Clearance: 113.6 mL/min (by C-G formula based on SCr of 0.81 mg/dL).  Liver Function Tests: Recent Labs  Lab 10/11/22 1240 10/17/22 0928  AST 246* 285*  ALT 126* 613*  ALKPHOS 76 362*  BILITOT 1.2 6.0*  PROT 5.7* 7.1  ALBUMIN 2.9* 3.6   Recent Labs  Lab 10/17/22 [id number]  LIPASE 1,478*     Radiological Exams on Admission: US Abdomen Limited RUQ (LIVER/GB)  Result Date: 10/17/2022 CLINICAL DATA:  Right upper quadrant pain. EXAM: ULTRASOUND ABDOMEN LIMITED RIGHT UPPER QUADRANT COMPARISON:  08/22/2018. FINDINGS: Gallbladder: Multiple gallstones. Small amount of sludge. No wall thickening or pericholecystic fluid. Common bile duct: Diameter: Dilated to 13 mm.  No visualized duct stone. Liver: Increased parenchymal echogenicity. Mild intrahepatic bile duct dilation. No liver mass. Portal vein is patent on color Doppler imaging with normal direction of blood flow towards the liver. Other: Trace ascites. IMPRESSION: 1. Dilated common bile duct. Mild intrahepatic bile duct dilation. Duct dilation is new compared to the abdomen ultrasound from 08/22/2018. Consider a distal duct obstruction/stone, which could  be further assessed with either ERCP or MRCP. 2. Multiple mobile gallstones with some gallbladder sludge, but no evidence of acute cholecystitis. 3. Increased liver parenchymal echogenicity suggests hepatic steatosis. Electronically Signed   By: Lajean Manes M.D.   On: 10/17/2022 10:15    My interpretation of Electrocardiogram: Sinus rhythm in the 90s.  Normal axis.  Intervals show prolonged QT though the computer is misreading P wave is a T wave.  No concerning ST or T wave changes noted.   Problem List  Principal Problem:   Acute gallstone pancreatitis Active Problems:   Cholelithiases   Abnormal LFTs   Hypokalemia   Hyponatremia   Acute biliary pancreatitis   Assessment: This is a 52 year old female with past medical history as mentioned earlier who presents with abdominal discomfort.  She is found to have elevated lipase.  She likely has acute biliary pancreatitis.  Previous history of alcoholism and none in the last 4 years.  Plan:  #1. Acute biliary pancreatitis: CT of the abdomen pelvis is pending.  EDP reaching out to [facility] gastroenterology.  She may need MRCP versus ERCP.  Supportive treatment for now.  IV fluids pain medications.  Antiemetics as needed.  Further management depending on results of the imaging studies and specialist input.  Leukocytosis is likely due to hemoconcentration along with inflammation rather than infection.  #2. Abnormal LFTs: Most likely due to cholelithiasis.  Ductal dilatation noted on ultrasound.  Alkaline phosphatase and bilirubin noted to be elevated.  Cholestatic picture.  Check hepatitis panel.  Trend LFTs.  #3. Hyponatremia and hypokalemia: Likely due to hypovolemia.  Give IV fluids.  Replace potassium.  Check magnesium level.  #4. Essential hypertension: Blood pressures are reasonably well-controlled currently.  Hold her home agents.  Medication reconciliation not completed yet.  #5. Mildly elevated hCG: She is status post total abdominal  hysterectomy with salpingo-oophorectomy.  This is likely an erroneous value.  #6. Concern for prolonged QT interval: EKG computer has interpreted the QT interval as being prolonged.  Computer is likely misreading P wave as the T wave.  On my review of the EKG QT interval is noted to be around 420 ms.    DVT Prophylaxis: Lovenox Code Status: Full code Family Communication: Discussed with patient Disposition: Hopefully return home when improved Consults called: Gastroenterology called by EDP Admission Status: Status is:  Inpatient Remains inpatient appropriate because: Acute pancreatitis    Severity of Illness: The appropriate patient status for this patient is INPATIENT. Inpatient status is judged to be reasonable and necessary in order to provide the required intensity of service to ensure the patient's safety. The patient's presenting symptoms, physical exam findings, and initial radiographic and laboratory data in the context of their chronic comorbidities is felt to place them at high risk for further clinical deterioration. Furthermore, it is not anticipated that the patient will be medically stable for discharge from the hospital within 2 midnights of admission.   * I certify that at the point of admission it is my clinical judgment that the patient will require inpatient hospital care spanning beyond 2 midnights from the point of admission due to high intensity of service, high risk for further deterioration and high frequency of surveillance required.*   Further management decisions will depend on results of further testing and patient's response to treatment.   Dois Juarbe [employer]  [facility] operational officer on [email or web address]  10/17/2022, 11:22 AM

## 2022-10-18 ENCOUNTER — Inpatient Hospital Stay (HOSPITAL_COMMUNITY): Payer: BC Managed Care – PPO

## 2022-10-18 ENCOUNTER — Encounter (HOSPITAL_COMMUNITY)
Admission: EM | Disposition: A | Discharge status: Home / Self Care | Payer: Self-pay | Source: Home / Self Care | Attending: Internal Medicine

## 2022-10-18 ENCOUNTER — Inpatient Hospital Stay (HOSPITAL_COMMUNITY): Payer: BC Managed Care – PPO | Admitting: Anesthesiology

## 2022-10-18 ENCOUNTER — Encounter (HOSPITAL_COMMUNITY): Payer: Self-pay | Admitting: Internal Medicine

## 2022-10-18 DIAGNOSIS — K8021 Calculus of gallbladder without cholecystitis with obstruction: Secondary | ICD-10-CM

## 2022-10-18 DIAGNOSIS — K851 Biliary acute pancreatitis without necrosis or infection: Secondary | ICD-10-CM | POA: Diagnosis not present

## 2022-10-18 DIAGNOSIS — R7989 Other specified abnormal findings of blood chemistry: Secondary | ICD-10-CM | POA: Diagnosis not present

## 2022-10-18 HISTORY — PX: ERCP: SHX5425

## 2022-10-18 HISTORY — PX: REMOVAL OF STONES: SHX5545

## 2022-10-18 HISTORY — PX: SPHINCTEROTOMY: SHX5279

## 2022-10-18 LAB — CBC
HCT: 36.9 % (ref 36.0–46.0)
Hemoglobin: 12.7 g/dL (ref 12.0–15.0)
MCH: 30.9 pg (ref 26.0–34.0)
MCHC: 34.4 g/dL (ref 30.0–36.0)
MCV: 89.8 fL (ref 80.0–100.0)
Platelets: 229 10*3/uL (ref 150–400)
RBC: 4.11 MIL/uL (ref 3.87–5.11)
RDW: 14.6 % (ref 11.5–15.5)
WBC: 18.3 10*3/uL — ABNORMAL HIGH (ref 4.0–10.5)
nRBC: 0 % (ref 0.0–0.2)

## 2022-10-18 LAB — HEPATITIS PANEL, ACUTE
HCV Ab: NONREACTIVE
Hep A IgM: NONREACTIVE
Hep B C IgM: NONREACTIVE
Hepatitis B Surface Ag: NONREACTIVE

## 2022-10-18 LAB — HIV ANTIBODY (ROUTINE TESTING W REFLEX): HIV Screen 4th Generation wRfx: NONREACTIVE

## 2022-10-18 LAB — COMPREHENSIVE METABOLIC PANEL
ALT: 487 U/L — ABNORMAL HIGH (ref 0–44)
AST: 224 U/L — ABNORMAL HIGH (ref 15–41)
Albumin: 2.8 g/dL — ABNORMAL LOW (ref 3.5–5.0)
Alkaline Phosphatase: 365 U/L — ABNORMAL HIGH (ref 38–126)
Anion gap: 13 (ref 5–15)
BUN: 13 mg/dL (ref 6–20)
CO2: 19 mmol/L — ABNORMAL LOW (ref 22–32)
Calcium: 7.9 mg/dL — ABNORMAL LOW (ref 8.9–10.3)
Chloride: 101 mmol/L (ref 98–111)
Creatinine, Ser: 0.72 mg/dL (ref 0.44–1.00)
GFR, Estimated: >60 mL/min (ref >60)
Glucose, Bld: 82 mg/dL (ref 70–99)
Potassium: 2.9 mmol/L — ABNORMAL LOW (ref 3.5–5.1)
Sodium: 133 mmol/L — ABNORMAL LOW (ref 135–145)
Total Bilirubin: 6.6 mg/dL — ABNORMAL HIGH (ref 0.3–1.2)
Total Protein: 5.8 g/dL — ABNORMAL LOW (ref 6.5–8.1)

## 2022-10-18 LAB — MAGNESIUM: Magnesium: 2.2 mg/dL (ref 1.7–2.4)

## 2022-10-18 LAB — LIPASE, BLOOD: Lipase: 294 U/L — ABNORMAL HIGH (ref 11–51)

## 2022-10-18 SURGERY — ERCP, WITH INTERVENTION IF INDICATED
Anesthesia: General

## 2022-10-18 MED ORDER — ROCURONIUM BROMIDE 10 MG/ML (PF) SYRINGE
PREFILLED_SYRINGE | INTRAVENOUS | Status: AC
  Filled 2022-10-18: qty 10

## 2022-10-18 MED ORDER — HYDROMORPHONE HCL 1 MG/ML IJ SOLN
1.0000 mg | INTRAMUSCULAR | Status: DC | PRN
  Administered 2022-10-18 (×2): 2 mg via INTRAVENOUS
  Administered 2022-10-18: 1 mg via INTRAVENOUS
  Administered 2022-10-19: 2 mg via INTRAVENOUS
  Administered 2022-10-19: 1 mg via INTRAVENOUS
  Administered 2022-10-19: 2 mg via INTRAVENOUS
  Administered 2022-10-19: 1 mg via INTRAVENOUS
  Administered 2022-10-19 (×2): 2 mg via INTRAVENOUS
  Administered 2022-10-20 (×3): 1 mg via INTRAVENOUS
  Administered 2022-10-20 (×2): 2 mg via INTRAVENOUS
  Administered 2022-10-20 – 2022-10-21 (×6): 1 mg via INTRAVENOUS
  Filled 2022-10-18: qty 2
  Filled 2022-10-18: qty 1
  Filled 2022-10-18 (×5): qty 2
  Filled 2022-10-18 (×7): qty 1
  Filled 2022-10-18: qty 2
  Filled 2022-10-18 (×4): qty 1
  Filled 2022-10-18: qty 2

## 2022-10-18 MED ORDER — POTASSIUM CHLORIDE 10 MEQ/100ML IV SOLN
10.0000 meq | INTRAVENOUS | Status: AC
  Administered 2022-10-18 (×2): 10 meq via INTRAVENOUS
  Filled 2022-10-18 (×2): qty 100

## 2022-10-18 MED ORDER — SODIUM CHLORIDE 0.9 % IV SOLN: INTRAVENOUS | Status: DC

## 2022-10-18 MED ORDER — METOPROLOL TARTRATE 5 MG/5ML IV SOLN
INTRAVENOUS | Status: AC
  Administered 2022-10-18: 2 mg via INTRAVENOUS
  Filled 2022-10-18: qty 5

## 2022-10-18 MED ORDER — DEXAMETHASONE SODIUM PHOSPHATE 10 MG/ML IJ SOLN
INTRAMUSCULAR | Status: AC
  Filled 2022-10-18: qty 1

## 2022-10-18 MED ORDER — ENOXAPARIN SODIUM 60 MG/0.6ML IJ SOSY
60.0000 mg | PREFILLED_SYRINGE | INTRAMUSCULAR | Status: DC
  Administered 2022-10-19 – 2022-10-24 (×6): 60 mg via SUBCUTANEOUS
  Filled 2022-10-18 (×6): qty 0.6

## 2022-10-18 MED ORDER — METOPROLOL TARTRATE 5 MG/5ML IV SOLN: 2.0000 mg | INTRAVENOUS | Status: DC | PRN

## 2022-10-18 MED ORDER — INDOMETHACIN 50 MG RE SUPP
RECTAL | Status: DC | PRN
  Administered 2022-10-18: 100 mg via RECTAL

## 2022-10-18 MED ORDER — METRONIDAZOLE 500 MG/100ML IV SOLN
500.0000 mg | Freq: Two times a day (BID) | INTRAVENOUS | Status: DC
  Administered 2022-10-18 – 2022-10-24 (×14): 500 mg via INTRAVENOUS
  Filled 2022-10-18 (×14): qty 100

## 2022-10-18 MED ORDER — PROPOFOL 10 MG/ML IV BOLUS
INTRAVENOUS | Status: DC | PRN
  Administered 2022-10-18: 110 mg via INTRAVENOUS

## 2022-10-18 MED ORDER — PHENYLEPHRINE 80 MCG/ML (10ML) SYRINGE FOR IV PUSH (FOR BLOOD PRESSURE SUPPORT)
PREFILLED_SYRINGE | INTRAVENOUS | Status: AC
  Filled 2022-10-18: qty 10

## 2022-10-18 MED ORDER — LIDOCAINE 2% (20 MG/ML) 5 ML SYRINGE
INTRAMUSCULAR | Status: AC
  Filled 2022-10-18: qty 5

## 2022-10-18 MED ORDER — CIPROFLOXACIN IN D5W 400 MG/200ML IV SOLN
400.0000 mg | Freq: Two times a day (BID) | INTRAVENOUS | Status: DC
Start: 2022-10-18 — End: 2022-10-25
  Administered 2022-10-18 – 2022-10-24 (×13): 400 mg via INTRAVENOUS
  Filled 2022-10-18 (×15): qty 200

## 2022-10-18 MED ORDER — FENTANYL CITRATE (PF) 250 MCG/5ML IJ SOLN
INTRAMUSCULAR | Status: DC | PRN
  Administered 2022-10-18 (×2): 50 ug via INTRAVENOUS

## 2022-10-18 MED ORDER — LIDOCAINE 2% (20 MG/ML) 5 ML SYRINGE
INTRAMUSCULAR | Status: DC | PRN
  Administered 2022-10-18: 20 mg via INTRAVENOUS

## 2022-10-18 MED ORDER — SUGAMMADEX SODIUM 200 MG/2ML IV SOLN
INTRAVENOUS | Status: DC | PRN
  Administered 2022-10-18: 400 mg via INTRAVENOUS

## 2022-10-18 MED ORDER — GADOBUTROL 1 MMOL/ML IV SOLN
10.0000 mL | Freq: Once | INTRAVENOUS | Status: AC | PRN
  Administered 2022-10-18: 10 mL via INTRAVENOUS

## 2022-10-18 MED ORDER — INDOMETHACIN 50 MG RE SUPP
RECTAL | Status: AC
  Filled 2022-10-18: qty 2

## 2022-10-18 MED ORDER — SUCCINYLCHOLINE CHLORIDE 200 MG/10ML IV SOSY
PREFILLED_SYRINGE | INTRAVENOUS | Status: AC
  Filled 2022-10-18: qty 10

## 2022-10-18 MED ORDER — SUCCINYLCHOLINE CHLORIDE 200 MG/10ML IV SOSY
PREFILLED_SYRINGE | INTRAVENOUS | Status: DC | PRN
  Administered 2022-10-18: 120 mg via INTRAVENOUS

## 2022-10-18 MED ORDER — DEXAMETHASONE SODIUM PHOSPHATE 10 MG/ML IJ SOLN
INTRAMUSCULAR | Status: DC | PRN
  Administered 2022-10-18: 8 mg via INTRAVENOUS

## 2022-10-18 MED ORDER — GLUCAGON HCL RDNA (DIAGNOSTIC) 1 MG IJ SOLR
INTRAMUSCULAR | Status: AC
  Filled 2022-10-18: qty 1

## 2022-10-18 MED ORDER — ONDANSETRON HCL 4 MG/2ML IJ SOLN
INTRAMUSCULAR | Status: DC | PRN
  Administered 2022-10-18: 4 mg via INTRAVENOUS

## 2022-10-18 MED ORDER — FENTANYL CITRATE (PF) 100 MCG/2ML IJ SOLN
INTRAMUSCULAR | Status: AC
  Filled 2022-10-18: qty 2

## 2022-10-18 MED ORDER — ROCURONIUM BROMIDE 10 MG/ML (PF) SYRINGE
PREFILLED_SYRINGE | INTRAVENOUS | Status: DC | PRN
  Administered 2022-10-18: 4 mg via INTRAVENOUS

## 2022-10-18 MED ORDER — SODIUM CHLORIDE 0.9 % IV SOLN
INTRAVENOUS | Status: DC | PRN
  Administered 2022-10-18: 40 mL

## 2022-10-18 MED ORDER — POTASSIUM CHLORIDE CRYS ER 20 MEQ PO TBCR
40.0000 meq | EXTENDED_RELEASE_TABLET | Freq: Once | ORAL | Status: AC
  Administered 2022-10-18: 40 meq via ORAL
  Filled 2022-10-18: qty 2

## 2022-10-18 MED ORDER — LACTATED RINGERS IV SOLN: INTRAVENOUS | Status: DC | PRN

## 2022-10-18 MED ORDER — CIPROFLOXACIN IN D5W 400 MG/200ML IV SOLN
INTRAVENOUS | Status: AC
  Filled 2022-10-18: qty 200

## 2022-10-18 MED ORDER — SODIUM CHLORIDE 0.9 % IV SOLN: INTRAVENOUS | Status: AC

## 2022-10-18 NOTE — Progress Notes (Signed)
[facility] PROGRESS NOTE   AZALEA NOLETTE [id number] DOB: [birth year] DOA: 10/17/2022  PCP: Aura Dials, MD  Brief History/Interval Summary:  52 y.o. female with past medical history of essential hypertension, previous history of alcoholism but none in the last 4 years who initially presented to the emergency department about a week ago with concern for chest pain.  During that evaluation she was found to have abnormal LFTs.  However it does not appear the patient was complaining of abdominal pain at that time.  She was asked to see her primary care provider.  She went to her primary care provider's office and had blood work done which showed elevated liver enzymes.  And then that night she started developing abdominal discomfort radiating to the right shoulder.  Found to have elevated lipase level.  She was admitted for further management of acute biliary pancreatitis.    Consultants: Gastroenterology  Procedures: None yet    Subjective/Interval History: Patient complains of 8 out of 10 abdominal pain this morning.  Some nausea overnight but no vomiting.  Noted to be breathing fast but she denies any shortness of breath per se.    Assessment/Plan:  Acute biliary pancreatitis Likely secondary to gallstones.  CT scan showed inflammation around the pancreas without any concerning features.  MRCP is pending.  Gastroenterology is following. Lipase level noted to be better today.  LFTs are stable. Patient continues to have significant abdominal pain.  Will increase the dose of her pain medications.  Respiratory rate is noted to be slightly elevated although her oxygen saturations are normal.  Heart rate is also slightly elevated.  Will transfer her to a progressive floor for closer monitoring. Continue n.p.o. status.  May have some ice chips and sips with medications. Elevated WBC is most likely due to acute inflammation.  Abnormal LFTs with cholelithiasis LFTs are  stable.  Bilirubin remains elevated along with elevated alkaline phosphatase.  MRCP was done overnight and report is pending. Gastroenterology is following as well.  There is concern for choledocholithiasis. Hepatitis panel is pending.  Hyponatremia and hypokalemia Sodium level is stable.  Continue to supplement potassium.  Magnesium was 2.2.  Essential hypertension Holding her home medications.  Monitor blood pressures closely. She was on amlodipine, lisinopril HCTZ and metoprolol prior to admission.  Mildly elevated beta-hCG She is status post abdominal hysterectomy with bilateral salpingo-oophorectomy.  The level is likely erroneous.  Abnormal EKG Initial EKG suggested prolonged QT interval but it was a misread.  EKG from this morning does not show any QT prolongation.  Obesity Estimated body mass index is 38.99 kg/m as calculated from the following:   Height as of this encounter: 5' 9"$  (1.753 m).   Weight as of this encounter: 119.7 kg.  DVT Prophylaxis: Lovenox Code Status: Full code Family Communication: Discussed with the patient Disposition Plan: Hopefully return home when improved  Status is: Inpatient Remains inpatient appropriate because: Acute pancreatitis     Medications: Scheduled:  enoxaparin (LOVENOX) injection  0.5 mg/kg Subcutaneous Q24H   potassium chloride  40 mEq Oral Once   Continuous:  sodium chloride 250 mL/hr at 10/18/22 0753   potassium chloride     [id number] **OR** acetaminophen, albuterol, hydrALAZINE, HYDROmorphone (DILAUDID) injection, ondansetron **OR** ondansetron (ZOFRAN) IV  Antibiotics: Anti-infectives (From admission, onward)    None       Objective:  Vital Signs  Vitals:   10/17/22 1950 10/18/22 0326 10/18/22 0750 10/18/22 0840  BP: 115/87 (!) 140/91 (!) 136/95  Pulse: (!) 107 (!) 107 (!) 108   Resp: 19 19 17 $ (!) 44  Temp: (!) 97.5 F (36.4 C) 98.3 F (36.8 C) 98.2 F (36.8 C)   TempSrc: Oral Oral Oral    SpO2: 100% 100% 100%   Weight:      Height:        Intake/Output Summary (Last 24 hours) at 10/18/2022 0855 Last data filed at 10/17/2022 1952 Gross per 24 hour  Intake --  Output 700 ml  Net -700 ml   Filed Weights   10/17/22 0912  Weight: 119.7 kg    General appearance: Awake alert.  In no distress Resp: Mildly tachypneic.  Diminished air entry at the bases.  No wheezing or rhonchi.  No definite crackles. Cardio: S1-S2 is tachycardic regular.  No S3-S4.  No rubs murmurs or bruit GI: Abdomen is slightly tense.  Tender in the epigastrium as well as right upper quadrant without any rebound rigidity.  Mild guarding is noted. Extremities: No edema.   Neurologic: Alert and oriented x3.  No focal neurological deficits.    Lab Results:  Data Reviewed: I have personally reviewed following labs and reports of the imaging studies  CBC: Recent Labs  Lab 10/11/22 1240 10/17/22 0928 10/18/22 0745  WBC 12.8* 17.3* 18.3*  NEUTROABS 10.2*  --   --   HGB 12.1 16.0* 12.7  HCT 35.3* 44.7 36.9  MCV 90.1 88.0 89.8  PLT 264 259 [id number]    Basic Metabolic Panel: Recent Labs  Lab 10/11/22 1240 10/11/22 1409 10/17/22 0928 10/18/22 0745  NA 137  --  131* 133*  K 2.8*  --  3.4* 2.9*  CL 106  --  95* 101  CO2 21*  --  20* 19*  GLUCOSE 81  --  117* 82  BUN 11  --  15 13  CREATININE 0.57  --  0.81 0.72  CALCIUM 8.2*  --  9.1 7.9*  MG  --  1.5*  --  2.2    GFR: Estimated Creatinine Clearance: 115.1 mL/min (by C-G formula based on SCr of 0.72 mg/dL).  Liver Function Tests: Recent Labs  Lab 10/11/22 1240 10/17/22 0928 10/18/22 0745  AST 246* 285* 224*  ALT 126* 613* 487*  ALKPHOS 76 362* 365*  BILITOT 1.2 6.0* 6.6*  PROT 5.7* 7.1 5.8*  ALBUMIN 2.9* 3.6 2.8*    Recent Labs  Lab 10/17/22 0928 10/18/22 0745  LIPASE 1,478* 294*     Lipid Profile: Recent Labs    10/17/22 1320  CHOL 299*  HDL 92  LDLCALC 185*  TRIG 112  CHOLHDL 3.3     Radiology Studies: CT  ABDOMEN PELVIS W CONTRAST  Result Date: 10/17/2022 CLINICAL DATA:  Biliary dilatation seen on ultrasound examination. Right-sided abdominal pain. EXAM: CT ABDOMEN AND PELVIS WITH CONTRAST TECHNIQUE: Multidetector CT imaging of the abdomen and pelvis was performed using the standard protocol following bolus administration of intravenous contrast. RADIATION DOSE REDUCTION: This exam was performed according to the departmental dose-optimization program which includes automated exposure control, adjustment of the mA and/or kV according to patient size and/or use of iterative reconstruction technique. CONTRAST:  123m OMNIPAQUE IOHEXOL 350 MG/ML SOLN COMPARISON:  Abdominal ultrasound, same date. FINDINGS: Lower chest: Status breathing motion artifact but no pulmonary lesions or pleural effusions. Heart is normal in size. No pericardial effusion. Small hiatal hernia. Hepatobiliary: Moderate intrahepatic biliary dilatation and common bile duct dilatation. Gallbladder demonstrates mild wall thickening. Gallstones are difficult to identified but were present on  the ultrasound. No hepatic lesions. The portal and hepatic veins are patent. No obvious obstructing common bile duct stone but recommend MRI abdomen/MRCP without with contrast for further evaluation. Pancreas: Inflammation around the pancreas but no CT findings to suggest acute pancreatitis. No pancreatic ductal dilatation or pancreatic head mass to account for the patient's biliary obstruction. No ampullary lesion is identified either. Spleen: Normal size.  No focal lesions. Adrenals/Urinary Tract: Adrenal glands and kidneys are normal. The bladder is normal. Stomach/Bowel: The stomach is unremarkable. There is inflammation surrounding the second and third portions of the duodenum but I do not think this is a primary duodenal process. The small bowel and colon are unremarkable. The terminal ileum and appendix are normal. No colonic mass or obstruction.  Vascular/Lymphatic: The aorta and branch vessels are patent. The major venous structures are patent. No mesenteric or retroperitoneal mass or adenopathy. Small scattered lymph nodes are noted. Reproductive: Surgically absent. Other: No pelvic mass or adenopathy. Small amount of fluid in the anterior pararenal spaces and also in the left pericolic gutter. No inguinal mass or adenopathy. No abdominal wall hernia or subcutaneous lesions. Musculoskeletal: No significant bony findings. IMPRESSION: 1. Moderate intrahepatic biliary dilatation and common bile duct dilatation. No obvious obstructing common bile duct stone but this is still most likely. No pancreatic head mass or ampullary lesion. Recommend MRI abdomen/MRCP without with contrast for further evaluation. 2. Inflammation around the pancreas and duodenum but no CT findings to suggest acute pancreatitis. 3. Mild gallbladder wall thickening but no pericholecystic fluid. Gallstones were noted on the abdominal ultrasound examination. 4. No other significant abdominal/pelvic findings. Electronically Signed   By: Marijo Sanes M.D.   On: 10/17/2022 11:45   US Abdomen Limited RUQ (LIVER/GB)  Result Date: 10/17/2022 CLINICAL DATA:  Right upper quadrant pain. EXAM: ULTRASOUND ABDOMEN LIMITED RIGHT UPPER QUADRANT COMPARISON:  08/22/2018. FINDINGS: Gallbladder: Multiple gallstones. Small amount of sludge. No wall thickening or pericholecystic fluid. Common bile duct: Diameter: Dilated to 13 mm.  No visualized duct stone. Liver: Increased parenchymal echogenicity. Mild intrahepatic bile duct dilation. No liver mass. Portal vein is patent on color Doppler imaging with normal direction of blood flow towards the liver. Other: Trace ascites. IMPRESSION: 1. Dilated common bile duct. Mild intrahepatic bile duct dilation. Duct dilation is new compared to the abdomen ultrasound from 08/22/2018. Consider a distal duct obstruction/stone, which could be further assessed with either  ERCP or MRCP. 2. Multiple mobile gallstones with some gallbladder sludge, but no evidence of acute cholecystitis. 3. Increased liver parenchymal echogenicity suggests hepatic steatosis. Electronically Signed   By: Lajean Manes M.D.   On: 10/17/2022 10:15       LOS: 1 day   [facility] Pager on [email or web address]  10/18/2022, 8:55 AM

## 2022-10-18 NOTE — Progress Notes (Signed)
Catherine [name] 1:43 PM  Subjective: Patient seen and examined and case discussed with my partner Dr. Alessandra Bevels and her hospital computer chart reviewed and we discussed her procedure and answered all of her questions  Objective: Signs stable afebrile no acute distress exam please see preassessment evaluation labs ultrasound CT and MRI reviewed  Assessment: Probable CBD stone  Plan: The risk benefits methods and success rate of ERCP was discussed with the patient and we will proceed today with further workup and plans pending those findings  [facility]  office [phone] After 5PM or if no answer call [phone]

## 2022-10-18 NOTE — Progress Notes (Addendum)
Pharmacy Antibiotic Note  Catherine [name] is a 52 y.o. female admitted on 10/17/2022 with abdominal pain and suspected intra-abdominal infection. Pharmacy has been consulted to initiate Cipro+ flagyl in the setting of a severe penicillin allergy (Anaphylactic reaction to amoxicillin requiring hospitalization ~ 10y ago)  Plan: Start Ciprofloxacin 462m IV Q 12h Start metronidazole 5095mIV Q8h Monitor renal function, resolution of s/s of infection, follow GI recommendations  Height: 5' 9"$  (175.3 cm) Weight: 119.7 kg (264 lb) IBW/kg (Calculated) : 66.2  Temp (24hrs), Avg:97.8 F (36.6 C), Min:97.3 F (36.3 C), Max:98.3 F (36.8 C)  Recent Labs  Lab 10/11/22 1240 10/17/22 0928 10/18/22 0745  WBC 12.8* 17.3* 18.3*  CREATININE 0.57 0.81 0.72    Estimated Creatinine Clearance: 115.1 mL/min (by C-G formula based on SCr of 0.72 mg/dL).    Allergies  Allergen Reactions   Augmentin [Amoxicillin-Pot Clavulanate] Anaphylaxis, Rash and Other (See Comments)    Severe stomach cramping   Penicillins Anaphylaxis and Rash    Has patient had a PCN reaction causing immediate rash, facial/tongue/throat swelling, SOB or lightheadedness with hypotension:  Has patient had a PCN reaction causing severe rash involving mucus membranes or skin necrosis: Has patient had a PCN reaction that required hospitalization Has patient had a PCN reaction occurring within the last 10 years: If all of the above answers are "NO", then may proceed with Cephalosporin use.    Depakote [Divalproex Sodium] Nausea And Vomiting   Latex Hives, Swelling and Rash    Throat swelling    Antimicrobials this admission: Cipro 2/18 >>  Flagyl 2/18 >>  Dose adjustments this admission: none  Microbiology results: None  Thank you for allowing pharmacy to be a part of this patient's care.  KeTitus DubinPharmD PGY1 Pharmacy Resident 10/18/2022 9:40 AM

## 2022-10-18 NOTE — Anesthesia Preprocedure Evaluation (Addendum)
Anesthesia Evaluation  Patient identified by MRN, date of birth, ID band Patient awake    Reviewed: Allergy & Precautions, NPO status , Patient's Chart, lab work & pertinent test results, reviewed documented beta blocker date and time   Airway Mallampati: II  TM Distance: >3 FB Neck ROM: Full    Dental  (+) Poor Dentition, Missing, Chipped, Loose,    Pulmonary Current Smoker and Patient abstained from smoking.    + decreased breath sounds      Cardiovascular hypertension, Pt. on medications and Pt. on home beta blockers  Rhythm:Regular Rate:Tachycardia     Neuro/Psych  Headaches PSYCHIATRIC DISORDERS Anxiety Depression Bipolar Disorder      GI/Hepatic Neg liver ROS,GERD  Medicated,,  Endo/Other  negative endocrine ROS    Renal/GU negative Renal ROS     Musculoskeletal negative musculoskeletal ROS (+)    Abdominal   Peds  Hematology negative hematology ROS (+)   Anesthesia Other Findings   Reproductive/Obstetrics                             Anesthesia Physical Anesthesia Plan  ASA: 2  Anesthesia Plan: General   Post-op Pain Management: Minimal or no pain anticipated   Induction: Intravenous  PONV Risk Score and Plan: 2 and Ondansetron, Midazolam and Treatment may vary due to age or medical condition  Airway Management Planned: Oral ETT  Additional Equipment: None  Intra-op Plan:   Post-operative Plan: Extubation in OR  Informed Consent: I have reviewed the patients History and Physical, chart, labs and discussed the procedure including the risks, benefits and alternatives for the proposed anesthesia with the patient or authorized representative who has indicated his/her understanding and acceptance.     Dental advisory given  Plan Discussed with: CRNA  Anesthesia Plan Comments:        Anesthesia Quick Evaluation

## 2022-10-18 NOTE — Anesthesia Procedure Notes (Addendum)
Procedure Name: Intubation Date/Time: 10/18/2022 2:04 PM  Performed by: Inda Coke, CRNAPre-anesthesia Checklist: Patient identified, Emergency Drugs available, Suction available, Timeout performed and Patient being monitored Patient Re-evaluated:Patient Re-evaluated prior to induction Oxygen Delivery Method: Circle system utilized Preoxygenation: Pre-oxygenation with 100% oxygen Induction Type: IV induction Ventilation: Mask ventilation without difficulty Laryngoscope Size: Mac and 3 Grade View: Grade III Tube type: Oral Tube size: 7.0 mm Number of attempts: 1 Airway Equipment and Method: Stylet Placement Confirmation: ETT inserted through vocal cords under direct vision, positive ETCO2, CO2 detector and breath sounds checked- equal and bilateral Secured at: 22 cm Tube secured with: Tape Dental Injury: Teeth and Oropharynx as per pre-operative assessment  Difficulty Due To: Difficult Airway- due to anterior larynx Future Recommendations: Recommend- induction with short-acting agent, and alternative techniques readily available

## 2022-10-18 NOTE — Anesthesia Postprocedure Evaluation (Signed)
Anesthesia Post Note  Patient: [name]  Procedure(s) Performed: ENDOSCOPIC RETROGRADE CHOLANGIOPANCREATOGRAPHY (ERCP) SPHINCTEROTOMY REMOVAL OF STONES     Patient location during evaluation: PACU Anesthesia Type: General Level of consciousness: awake and alert Pain management: pain level controlled Vital Signs Assessment: post-procedure vital signs reviewed and stable Respiratory status: spontaneous breathing, nonlabored ventilation, respiratory function stable and patient connected to nasal cannula oxygen Cardiovascular status: blood pressure returned to baseline and stable Postop Assessment: no apparent nausea or vomiting Anesthetic complications: no  No notable events documented.  Last Vitals:  Vitals:   10/18/22 1456 10/18/22 1500  BP:  111/76  Pulse:  (!) 119  Resp:  (!) 25  Temp: 37.4 C   SpO2:  92%    Last Pain:  Vitals:   10/18/22 1500  TempSrc:   PainSc: 0-No pain                 Catherine [name]

## 2022-10-18 NOTE — Transfer of Care (Signed)
Immediate Anesthesia Transfer of Care Note  Patient: Catherine [name]  Procedure(s) Performed: ENDOSCOPIC RETROGRADE CHOLANGIOPANCREATOGRAPHY (ERCP) SPHINCTEROTOMY REMOVAL OF STONES  Patient Location: PACU  Anesthesia Type:General  Level of Consciousness: awake, alert , and oriented  Airway & Oxygen Therapy: Patient Spontanous Breathing  Post-op Assessment: Report given to RN and Post -op Vital signs reviewed and stable  Post vital signs: Reviewed and stable  Last Vitals:  Vitals Value Taken Time  BP 139/85 10/18/22 1454  Temp    Pulse 117 10/18/22 1454  Resp 22 10/18/22 1456  SpO2 93 % 10/18/22 1454  Vitals shown include unvalidated device data.  Last Pain:  Vitals:   10/18/22 1341  TempSrc: Temporal  PainSc: 8          Complications: No notable events documented.

## 2022-10-18 NOTE — Op Note (Signed)
[facility] Patient Name: Catherine [name] Procedure Date : 10/18/2022 MRN: [id number] Attending MD: Clarene Essex , MD, [id number] Date of Birth: March 15, 1971 CSN: [id number] Age: 52 Admit Type: Inpatient Procedure:                ERCP Indications:              For therapy of bile duct stone(s) Providers:                Clarene Essex, MD, Doristine Johns, RN, Benetta Spar, Technician Referring MD:              Medicines:                General Anesthesia Complications:            No immediate complications. Estimated Blood Loss:     Estimated blood loss was minimal. Procedure:                Pre-Anesthesia Assessment:                           - Prior to the procedure, a History and Physical                            was performed, and patient medications and                            allergies were reviewed. The patient's tolerance of                            previous anesthesia was also reviewed. The risks                            and benefits of the procedure and the sedation                            options and risks were discussed with the patient.                            All questions were answered, and informed consent                            was obtained. Prior Anticoagulants: The patient has                            taken no anticoagulant or antiplatelet agents. ASA                            Grade Assessment: II - A patient with mild systemic                            disease. After reviewing the risks and benefits,  the patient was deemed in satisfactory condition to                            undergo the procedure.                           After obtaining informed consent, the scope was                            passed under direct vision. Throughout the                            procedure, the patient's blood pressure, pulse, and                            oxygen saturations were  monitored continuously. The                            [id number]) Olympus duodenoscope was                            introduced through the mouth, and used to inject                            contrast into and used to cannulate the bile duct.                            The ERCP was accomplished without difficulty. The                            patient tolerated the procedure well. Scope In: Scope Out: Findings:      The major papilla was slightly bulging with a questionable hint of the       tiny stone at the ostia. Deep selective cannulation was readily obtained       and there was no pancreatic duct injection or wire advancement       throughout the procedure and we proceeded with a biliary sphincterotomy       which was made with a Hydratome sphincterotome using ERBE       electrocautery. There was self limited oozing from the sphincterotomy       site from pulling the balloon through and not from the actual       sphincterotomy which did not require treatment and was not bleeding at       the end of the procedure. The biliary tree was swept with an adjustable       12- 15 mm balloon starting at the bifurcation. All stones were removed.       Nothing was found at the end of the procedure on occlusion cholangiogram       and multiple balloon pull-through's. Both size balloons passed readily       through the patent sphincterotomy site and on the initial few balloon       pull-through's Lex of tiny stones were delivered but none on the       subsequent pull-through's and there was adequate biliary drainage after  the occlusion cholangiogram the wire and introducer and scope were       removed and the patient tolerated the procedure well the CBD was       slightly dilated not mentioned above Impression:               - The major papilla appeared to be slightly bulging.                           - Choledocholithiasis was found. Complete removal                             was accomplished by biliary sphincterotomy and                            balloon extraction.                           - A biliary sphincterotomy was performed.                           - The biliary tree was swept and nothing was found                            the end of the procedure. Recommendation:           - Clear liquid diet today. N.p.o. after midnight in                            case lap chole to be done tomorrow                           - Continue present medications.                           - Return to GI clinic PRN.                           - Telephone GI clinic if symptomatic PRN.                           - Check liver enzymes (AST, ALT, alkaline                            phosphatase, bilirubin) and hemogram with white                            blood cell count and platelets in the morning                            follow back to normal as an outpatient.                           - Refer to a surgeon today I will ask the  hospitalist team based on this report to call the                            surgeons to proceed with lap chole when                            pancreatitis has decreased. Procedure Code(s):        --- Professional ---                           [phone], Endoscopic retrograde                            cholangiopancreatography (ERCP); with removal of                            calculi/debris from biliary/pancreatic duct(s)                           43262, Endoscopic retrograde                            cholangiopancreatography (ERCP); with                            sphincterotomy/papillotomy Diagnosis Code(s):        --- Professional ---                           K80.50, Calculus of bile duct without cholangitis                            or cholecystitis without obstruction                           K83.8, Other specified diseases of biliary tract CPT copyright 2022 [employer]. All rights  reserved. The codes documented in this report are preliminary and upon coder review may  be revised to meet current compliance requirements. Clarene Essex, MD 10/18/2022 2:51:09 PM This report has been signed electronically. Number of Addenda: 0

## 2022-10-18 NOTE — Progress Notes (Signed)
[facility] Gastroenterology Progress Note  JOI DASGUPTA 52 y.o. Aug 11, 1971  CC: Abdominal pain, abnormal LFTs   Subjective: Patient seen and examined at bedside.  She is having more right-sided abdominal pain.  ROS : Afebrile, negative for chest pain   Objective: Vital signs in last 24 hours: Vitals:   10/18/22 0840 10/18/22 0911  BP:    Pulse:    Resp: (!) 44 (!) 22  Temp:    SpO2:      Physical Exam: General:   Alert,  Well-developed, well-nourished, pleasant and cooperative in NAD scleral icterus noted Heart: Tachycardia Abdomen: Right upper quadrant and epigastric tenderness to palpation with guarding, no peritoneal signs, bowel sounds present, abdomen is otherwise soft. Neuro -alert and oriented x 3 Psych - mood and Affect normal Rectal:  Deferred   Lab Results: Recent Labs    10/17/22 0928 10/18/22 0745  NA 131* 133*  K 3.4* 2.9*  CL 95* 101  CO2 20* 19*  GLUCOSE 117* 82  BUN 15 13  CREATININE 0.81 0.72  CALCIUM 9.1 7.9*  MG  --  2.2   Recent Labs    10/17/22 0928 10/18/22 0745  AST 285* 224*  ALT 613* 487*  ALKPHOS 362* 365*  BILITOT 6.0* 6.6*  PROT 7.1 5.8*  ALBUMIN 3.6 2.8*   Recent Labs    10/17/22 0928 10/18/22 0745  WBC 17.3* 18.3*  HGB 16.0* 12.7  HCT 44.7 36.9  MCV 88.0 89.8  PLT 259 229   No results for input(s): "LABPROT", "INR" in the last 72 hours.    Assessment/Plan: -Epigastric abdominal pain radiating to back.  Elevated lipase.  CT scan showing pancreatitis as well as gallstone and dilated CBD.  Likely gallstone pancreatitis.  Patient with occasional alcohol use. -Abnormal LFTs with jaundice.  Likely from above.  Need to rule out CBD stone given biliary dilatation seen on the ultrasound and CT scan.  No obvious choledocholithiasis seen on the CT scan.  -MRI MRCP showed CBD of 1.1 cm with fusiform dilation of the CBD as well as mild intrahepatic bile duct dilation.Marland Kitchen  Appearance of intrahepatic bile ducts also noted as  well as there is questionable 3 mm stone at ampulla.   Recommendations -------------------------- -Start IV ciprofloxacin and Flagyl.  Allergic to penicillin. -Continue IV hydration for pancreatitis -Discussed with Dr. Watt Climes.  Plan for ERCP around 2 PM today.  Also discussed with Dr. [place] Pink.  Risks (pancreatitis, bleeding, infection, bowel perforation that could require surgery, sedation-related changes in cardiopulmonary systems), benefits (identification and possible treatment of source of symptoms, exclusion of certain causes of symptoms), and alternatives (watchful waiting, radiographic imaging studies, empiric medical treatment)  were explained to patient/family in detail and patient wishes to proceed.    Otis Brace MD, [place] 10/18/2022, 9:20 AM  Contact #  [phone]

## 2022-10-19 ENCOUNTER — Encounter (HOSPITAL_COMMUNITY): Payer: Self-pay | Admitting: Internal Medicine

## 2022-10-19 DIAGNOSIS — K8021 Calculus of gallbladder without cholecystitis with obstruction: Secondary | ICD-10-CM | POA: Diagnosis not present

## 2022-10-19 DIAGNOSIS — I10 Essential (primary) hypertension: Secondary | ICD-10-CM

## 2022-10-19 DIAGNOSIS — K851 Biliary acute pancreatitis without necrosis or infection: Secondary | ICD-10-CM | POA: Diagnosis not present

## 2022-10-19 LAB — COMPREHENSIVE METABOLIC PANEL
ALT: 311 U/L — ABNORMAL HIGH (ref 0–44)
AST: 91 U/L — ABNORMAL HIGH (ref 15–41)
Albumin: 2.5 g/dL — ABNORMAL LOW (ref 3.5–5.0)
Alkaline Phosphatase: 280 U/L — ABNORMAL HIGH (ref 38–126)
Anion gap: 10 (ref 5–15)
BUN: 11 mg/dL (ref 6–20)
CO2: 21 mmol/L — ABNORMAL LOW (ref 22–32)
Calcium: 8.3 mg/dL — ABNORMAL LOW (ref 8.9–10.3)
Chloride: 104 mmol/L (ref 98–111)
Creatinine, Ser: 0.64 mg/dL (ref 0.44–1.00)
GFR, Estimated: >60 mL/min (ref >60)
Glucose, Bld: 110 mg/dL — ABNORMAL HIGH (ref 70–99)
Potassium: 4.1 mmol/L (ref 3.5–5.1)
Sodium: 135 mmol/L (ref 135–145)
Total Bilirubin: 2.2 mg/dL — ABNORMAL HIGH (ref 0.3–1.2)
Total Protein: 5.6 g/dL — ABNORMAL LOW (ref 6.5–8.1)

## 2022-10-19 LAB — CBC
HCT: 32 % — ABNORMAL LOW (ref 36.0–46.0)
Hemoglobin: 11.4 g/dL — ABNORMAL LOW (ref 12.0–15.0)
MCH: 31.8 pg (ref 26.0–34.0)
MCHC: 35.6 g/dL (ref 30.0–36.0)
MCV: 89.1 fL (ref 80.0–100.0)
Platelets: 176 10*3/uL (ref 150–400)
RBC: 3.59 MIL/uL — ABNORMAL LOW (ref 3.87–5.11)
RDW: 14.6 % (ref 11.5–15.5)
WBC: 21.7 10*3/uL — ABNORMAL HIGH (ref 4.0–10.5)
nRBC: 0 % (ref 0.0–0.2)

## 2022-10-19 LAB — LIPASE, BLOOD: Lipase: 59 U/L — ABNORMAL HIGH (ref 11–51)

## 2022-10-19 MED ORDER — BUTALBITAL-APAP-CAFFEINE 50-325-40 MG PO TABS
2.0000 | ORAL_TABLET | Freq: Once | ORAL | Status: AC
  Administered 2022-10-19: 2 via ORAL
  Filled 2022-10-19: qty 2

## 2022-10-19 NOTE — Progress Notes (Signed)
[facility] Gastroenterology Progress Note  Catherine [name] 52 y.o. 1970-12-02  CC: Gallstone pancreatitis   Subjective: Patient seen and examined at bedside.  Significant improvement in abdominal pain after ERCP.  Still afraid to advance diet.  ROS : Afebrile, negative for chest pain   Objective: Vital signs in last 24 hours: Vitals:   10/19/22 0037 10/19/22 0331  BP:  (!) 132/91  Pulse:  98  Resp:  (!) 21  Temp: 97.8 F (36.6 C) 98 F (36.7 C)  SpO2:  96%    Physical Exam:  General:  Alert, cooperative, no distress, appears stated age  Head:  Normocephalic, without obvious abnormality, atraumatic  Eyes:  , EOM's intact,   Lungs:   Clear to auscultation bilaterally, respirations unlabored  Heart:  Regular rate and rhythm, S1, S2 normal  Abdomen:   Soft, epigastric discomfort on palpation, bowel sounds present, no peritoneal signs  Extremities: Extremities normal, atraumatic, no  edema  Pulses: 2+ and symmetric    Lab Results: Recent Labs    10/18/22 0745 10/19/22 0412  NA 133* 135  K 2.9* 4.1  CL 101 104  CO2 19* 21*  GLUCOSE 82 110*  BUN 13 11  CREATININE 0.72 0.64  CALCIUM 7.9* 8.3*  MG 2.2  --    Recent Labs    10/18/22 0745 10/19/22 0412  AST 224* 91*  ALT 487* 311*  ALKPHOS 365* 280*  BILITOT 6.6* 2.2*  PROT 5.8* 5.6*  ALBUMIN 2.8* 2.5*   Recent Labs    10/18/22 0745 10/19/22 0412  WBC 18.3* 21.7*  HGB 12.7 11.4*  HCT 36.9 32.0*  MCV 89.8 89.1  PLT 229 176   No results for input(s): "LABPROT", "INR" in the last 72 hours.    Assessment/Plan: -Gallstone pancreatitis -Cholelithiasis.  S/p ERCP with sphincterotomy and stone extraction yesterday.  LFTs improved significantly.  Recommendations ------------------------ -Continue antibiotics.  Surgical team is planning for cholecystectomy tomorrow.  No further inpatient GI workup planned.  Follow-up with GI clinic if abnormal LFT persists after few months. -Patient does not want to  advance diet at this time.   Otis Brace MD, [place] 10/19/2022, 12:57 PM  Contact #  [phone]

## 2022-10-19 NOTE — Consult Note (Signed)
Reason for Consult:abdominal pain Referring Physician: Dr. Earna Coder is an 52 y.o. female.  HPI: Pt is a 56 F with abd pain since Aug that she felt was chest pain. Pt was seen by cards and was not found to be of cardiac origin. Pt with Pain Sat in the epigastrum.  Pt with no n/v/d.  Pt was seen in ED and seen to have elev LFTs and lipase.  Pt underwent ERCP and CBD stone clearance.  Pt with some con't abd pain today.  General Surg was consulted for cholecystectomy  Pt states that she has had a prev C/s in the past.    Past Medical History:  Diagnosis Date   Anxiety    Bipolar 1 disorder ([place])    Cystitis, interstitial    Depression    Hypertension    Migraine     Past Surgical History:  Procedure Laterality Date   ABDOMINAL HYSTERECTOMY     CYSTOSCOPY     TOTAL ABDOMINAL HYSTERECTOMY W/ BILATERAL SALPINGOOPHORECTOMY      Family History  Problem Relation Age of Onset   Sudden death Mother        suicide   Hypothyroidism Sister     Social History:  reports that she has been smoking e-cigarettes. She has never used smokeless tobacco. She reports current alcohol use. She reports that she does not use drugs.  Allergies:  Allergies  Allergen Reactions   Augmentin [Amoxicillin-Pot Clavulanate] Anaphylaxis, Rash and Other (See Comments)    Severe stomach cramping   Penicillins Anaphylaxis and Rash    Has patient had a PCN reaction causing immediate rash, facial/tongue/throat swelling, SOB or lightheadedness with hypotension:  Has patient had a PCN reaction causing severe rash involving mucus membranes or skin necrosis: Has patient had a PCN reaction that required hospitalization Has patient had a PCN reaction occurring within the last 10 years: If all of the above answers are "NO", then may proceed with Cephalosporin use.    Depakote [Divalproex Sodium] Nausea And Vomiting   Latex Hives, Swelling and Rash    Throat swelling    Medications: I have  reviewed the patient's current medications.  Results for orders placed or performed during the hospital encounter of 10/17/22 (from the past 48 hour(s))  Lipase, blood     Status: Abnormal   Collection Time: 10/17/22  9:28 AM  Result Value Ref Range   Lipase 1,478 (H) 11 - 51 U/L    Comment: RESULT CONFIRMED BY MANUAL DILUTION Performed at [facility], [address] [place], [place] [address]   Comprehensive metabolic panel     Status: Abnormal   Collection Time: 10/17/22  9:28 AM  Result Value Ref Range   Sodium 131 (L) 135 - 145 mmol/L   Potassium 3.4 (L) 3.5 - 5.1 mmol/L    Comment: HEMOLYSIS AT THIS LEVEL MAY AFFECT RESULT   Chloride 95 (L) 98 - 111 mmol/L   CO2 20 (L) 22 - 32 mmol/L   Glucose, Bld 117 (H) 70 - 99 mg/dL    Comment: Glucose reference range applies only to samples taken after fasting for at least 8 hours.   BUN 15 6 - 20 mg/dL   Creatinine, Ser 0.81 0.44 - 1.00 mg/dL   Calcium 9.1 8.9 - 10.3 mg/dL   Total Protein 7.1 6.5 - 8.1 g/dL   Albumin 3.6 3.5 - 5.0 g/dL   AST 285 (H) 15 - 41 U/L    Comment: HEMOLYSIS  AT THIS LEVEL MAY AFFECT RESULT   ALT 613 (H) 0 - 44 U/L    Comment: HEMOLYSIS AT THIS LEVEL MAY AFFECT RESULT   Alkaline Phosphatase 362 (H) 38 - 126 U/L   Total Bilirubin 6.0 (H) 0.3 - 1.2 mg/dL    Comment: HEMOLYSIS AT THIS LEVEL MAY AFFECT RESULT   GFR, Estimated >60 >60 mL/min    Comment: (NOTE) Calculated using the CKD-EPI Creatinine Equation (2021)    Anion gap 16 (H) 5 - 15    Comment: Performed at [facility] [address], [place], [place] [address]  CBC     Status: Abnormal   Collection Time: 10/17/22  9:28 AM  Result Value Ref Range   WBC 17.3 (H) 4.0 - 10.5 K/uL   RBC 5.08 3.87 - 5.11 MIL/uL   Hemoglobin 16.0 (H) 12.0 - 15.0 g/dL   HCT 44.7 36.0 - 46.0 %   MCV 88.0 80.0 - 100.0 fL   MCH 31.5 26.0 - 34.0 pg   MCHC 35.8 30.0 - 36.0 g/dL   RDW 14.0 11.5 - 15.5 %   Platelets 259 150 - 400 K/uL   nRBC 0.0 0.0 - 0.2 %     Comment: Performed at [facility], [place] [address], [place], [place] [address]  I-Stat beta hCG blood, ED     Status: Abnormal   Collection Time: 10/17/22  9:35 AM  Result Value Ref Range   I-stat hCG, quantitative 6.1 (H) <5 mIU/mL   Comment 3            Comment:   GEST. AGE      CONC.  (mIU/mL)   <=1 WEEK        5 - 50     2 WEEKS       50 - 500     3 WEEKS       100 - 10,000     4 WEEKS     1,000 - 30,000        FEMALE AND NON-PREGNANT FEMALE:     LESS THAN 5 mIU/mL   Lipid panel     Status: Abnormal   Collection Time: 10/17/22  1:20 PM  Result Value Ref Range   Cholesterol 299 (H) 0 - 200 mg/dL   Triglycerides 112 <150 mg/dL   HDL 92 >40 mg/dL   Total CHOL/HDL Ratio 3.3 RATIO   VLDL 22 0 - 40 mg/dL   LDL Cholesterol 185 (H) 0 - 99 mg/dL    Comment:        Total Cholesterol/HDL:CHD Risk Coronary Heart Disease Risk Table                     Men   Women  1/2 Average Risk   3.4   3.3  Average Risk       5.0   4.4  2 X Average Risk   9.6   7.1  3 X Average Risk  23.4   11.0        Use the calculated Patient Ratio above and the CHD Risk Table to determine the patient's CHD Risk.        ATP III CLASSIFICATION (LDL):  <100     mg/dL   Optimal  100-129  mg/dL   Near or Above                    Optimal  130-159  mg/dL   Borderline  160-189  mg/dL   High  >190     mg/dL   Very High Performed at [facility] [address] [place], [place] [address]   Urinalysis, Routine w reflex microscopic -Urine, Random     Status: Abnormal   Collection Time: 10/17/22  8:15 PM  Result Value Ref Range   Color, Urine AMBER (A) YELLOW    Comment: BIOCHEMICALS MAY BE AFFECTED BY COLOR   APPearance CLEAR CLEAR   Specific Gravity, Urine 1.020 1.005 - 1.030   pH 5.5 5.0 - 8.0   Glucose, UA 100 (A) NEGATIVE mg/dL   Hgb urine dipstick TRACE (A) NEGATIVE   Bilirubin Urine LARGE (A) NEGATIVE   Ketones, ur 15 (A) NEGATIVE mg/dL   Protein, ur NEGATIVE NEGATIVE mg/dL   Nitrite  NEGATIVE NEGATIVE   Leukocytes,Ua NEGATIVE NEGATIVE    Comment: Performed at [facility], [place] [address] [place], [place] [address]  Urinalysis, Microscopic (reflex)     Status: Abnormal   Collection Time: 10/17/22  8:15 PM  Result Value Ref Range   RBC / HPF 0-5 0 - 5 RBC/hpf   WBC, UA 0-5 0 - 5 WBC/hpf   Bacteria, UA RARE (A) NONE SEEN   Squamous Epithelial / HPF 0-5 0 - 5 /HPF   Mucus PRESENT     Comment: Performed at [facility], [place] [address] [place], [place] [address]  HIV Antibody (routine testing w rflx)     Status: None   Collection Time: 10/18/22  7:45 AM  Result Value Ref Range   HIV Screen 4th Generation wRfx Non Reactive Non Reactive    Comment: Performed at [facility], [place] [address] [place], [place] [address]  Hepatitis panel, acute     Status: None   Collection Time: 10/18/22  7:45 AM  Result Value Ref Range   Hepatitis B Surface Ag NON REACTIVE NON REACTIVE   HCV Ab NON REACTIVE NON REACTIVE    Comment: (NOTE) Nonreactive HCV antibody screen is consistent with no HCV infections,  unless recent infection is suspected or other evidence exists to indicate HCV infection.     Hep A IgM NON REACTIVE NON REACTIVE   Hep B C IgM NON REACTIVE NON REACTIVE    Comment: Performed at [facility], [place] [address] [place], [place] [address]  Comprehensive metabolic panel     Status: Abnormal   Collection Time: 10/18/22  7:45 AM  Result Value Ref Range   Sodium 133 (L) 135 - 145 mmol/L   Potassium 2.9 (L) 3.5 - 5.1 mmol/L   Chloride 101 98 - 111 mmol/L   CO2 19 (L) 22 - 32 mmol/L   Glucose, Bld 82 70 - 99 mg/dL    Comment: Glucose reference range applies only to samples taken after fasting for at least 8 hours.   BUN 13 6 - 20 mg/dL   Creatinine, Ser 0.72 0.44 - 1.00 mg/dL   Calcium 7.9 (L) 8.9 - 10.3 mg/dL   Total Protein 5.8 (L) 6.5 - 8.1 g/dL   Albumin 2.8 (L) 3.5 - 5.0 g/dL   AST 224 (H) 15 - 41 U/L   ALT 487 (H) 0 - 44 U/L    Alkaline Phosphatase 365 (H) 38 - 126 U/L   Total Bilirubin 6.6 (H) 0.3 - 1.2 mg/dL   GFR, Estimated >60 >60 mL/min    Comment: (NOTE) Calculated using the CKD-EPI Creatinine Equation (2021)    Anion  gap 13 5 - 15    Comment: Performed at [facility], [place] [address] [place], [place] [address]  CBC     Status: Abnormal   Collection Time: 10/18/22  7:45 AM  Result Value Ref Range   WBC 18.3 (H) 4.0 - 10.5 K/uL   RBC 4.11 3.87 - 5.11 MIL/uL   Hemoglobin 12.7 12.0 - 15.0 g/dL   HCT 36.9 36.0 - 46.0 %   MCV 89.8 80.0 - 100.0 fL   MCH 30.9 26.0 - 34.0 pg   MCHC 34.4 30.0 - 36.0 g/dL   RDW 14.6 11.5 - 15.5 %   Platelets 229 150 - 400 K/uL   nRBC 0.0 0.0 - 0.2 %    Comment: Performed at [facility], [place] [address] [place], [place] [address]  Magnesium     Status: None   Collection Time: 10/18/22  7:45 AM  Result Value Ref Range   Magnesium 2.2 1.7 - 2.4 mg/dL    Comment: Performed at [facility] [address], [place], [place] [address]  Lipase, blood     Status: Abnormal   Collection Time: 10/18/22  7:45 AM  Result Value Ref Range   Lipase 294 (H) 11 - 51 U/L    Comment: Performed at [facility], [place] [address] [place], [place] [address]  Comprehensive metabolic panel     Status: Abnormal   Collection Time: 10/19/22  4:12 AM  Result Value Ref Range   Sodium 135 135 - 145 mmol/L   Potassium 4.1 3.5 - 5.1 mmol/L   Chloride 104 98 - 111 mmol/L   CO2 21 (L) 22 - 32 mmol/L   Glucose, Bld 110 (H) 70 - 99 mg/dL    Comment: Glucose reference range applies only to samples taken after fasting for at least 8 hours.   BUN 11 6 - 20 mg/dL   Creatinine, Ser 0.64 0.44 - 1.00 mg/dL   Calcium 8.3 (L) 8.9 - 10.3 mg/dL   Total Protein 5.6 (L) 6.5 - 8.1 g/dL   Albumin 2.5 (L) 3.5 - 5.0 g/dL   AST 91 (H) 15 - 41 U/L   ALT 311 (H) 0 - 44 U/L   Alkaline Phosphatase 280 (H) 38 - 126 U/L   Total Bilirubin 2.2 (H) 0.3 - 1.2 mg/dL   GFR, Estimated >60 >60 mL/min     Comment: (NOTE) Calculated using the CKD-EPI Creatinine Equation (2021)    Anion gap 10 5 - 15    Comment: Performed at [facility], [place] [address] [place], [place] [address]  CBC     Status: Abnormal   Collection Time: 10/19/22  4:12 AM  Result Value Ref Range   WBC 21.7 (H) 4.0 - 10.5 K/uL   RBC 3.59 (L) 3.87 - 5.11 MIL/uL   Hemoglobin 11.4 (L) 12.0 - 15.0 g/dL   HCT 32.0 (L) 36.0 - 46.0 %   MCV 89.1 80.0 - 100.0 fL   MCH 31.8 26.0 - 34.0 pg   MCHC 35.6 30.0 - 36.0 g/dL   RDW 14.6 11.5 - 15.5 %   Platelets 176 150 - 400 K/uL   nRBC 0.0 0.0 - 0.2 %    Comment: Performed at [facility], [place] [address], [place], [place] [address]  Lipase, blood     Status: Abnormal   Collection Time: 10/19/22  4:12 AM  Result Value Ref Range   Lipase 59 (H) 11 -  51 U/L    Comment: Performed at [facility], [place] [address], [place], [place] [address]    DG ERCP  Result Date: 10/19/2022 CLINICAL DATA:  [id number] Surgery, elective [id number] EXAM: ERCP COMPARISON:  MRCP 10/18/2022.  CT AP, 10/17/2022. FLUOROSCOPY: Exposure Index (as provided by the fluoroscopic device): 27.4 mGy Kerma FINDINGS: Multiple, limited oblique planar images of the RIGHT upper quadrant obtained C-arm. Images demonstrating flexible endoscopy, biliary duct cannulation, sphincterotomy, retrograde cholangiogram and balloon sweep. No biliary ductal dilation. No evidence of biliary filling defect is demonstrated. IMPRESSION: Fluoroscopic imaging for ERCP. No discrete biliary defect is demonstrated For complete description of intra procedural findings, please see performing service dictation. Electronically Signed   By: Michaelle Birks M.D.   On: 10/19/2022 06:31   MR ABDOMEN MRCP W WO CONTAST  Result Date: 10/18/2022 CLINICAL DATA:  Right upper quadrant pain.  Gallstones. EXAM: MRI ABDOMEN WITHOUT AND WITH CONTRAST (INCLUDING MRCP) TECHNIQUE: Multiplanar multisequence MR imaging of the abdomen was performed both before and  after the administration of intravenous contrast. Heavily T2-weighted images of the biliary and pancreatic ducts were obtained, and three-dimensional MRCP images were rendered by post processing. CONTRAST:  15m GADAVIST GADOBUTROL 1 MMOL/ML IV SOLN COMPARISON:  CT AP 10/17/2022 FINDINGS: Lower chest: No acute findings. Hepatobiliary: No suspicious enhancing liver lesions. Sludge and multiple tiny gallstones identified within the gallbladder. The gallbladder wall is thickened measuring 5 mm. No pericholecystic fluid. Fusiform dilatation of the CBD with mild diffuse intrahepatic bile duct dilatation. The CBD measures up to 1.1 cm. Enhancement of the bile ducts identified. On the MRCP images the intrahepatic bile ducts have somewhat of an irregular and beaded appearance. No definite signs of choledocholithiasis. However, at the convergence of the dilated CBD and normal caliber main pancreatic duct there is a tiny T2 hypointense structure which measures 3 mm, image 21/9. This is seen on only 1 sequence. Small impacted stone at the ampulla cannot be excluded. Pancreas: No signs of main duct dilatation or mass. Diffuse pancreatic edema with peripancreatic fat stranding is identified. Free fluid extends into the retroperitoneum and along the left pericolic gutter. No loculated fluid collections identified to suggest abscess or pseudocyst. Spleen:  Within normal limits in size and appearance. Adrenals/Urinary Tract: No masses identified. No evidence of hydronephrosis. Stomach/Bowel: Small hiatal hernia. Stomach appears nondistended. No pathologic dilatation of the large or small bowel loops. Mild increase caliber of the jejunum may reflect paralytic ileus secondary to upper abdominal inflammation Vascular/Lymphatic: Normal caliber of the abdominal aorta. Upper abdominal vascularity is patent. Prominent upper abdominal lymph nodes are likely reactive. Other: Fat stranding and fluid identified within the retroperitoneum and  extending along the pericolic gutters bilaterally. No discrete fluid collection to suggest abscess. Musculoskeletal: No suspicious bone lesions identified. IMPRESSION: 1. Imaging findings compatible with acute pancreatitis. No signs pancreatic necrosis or pseudocyst formation. 2. Sludge and multiple tiny gallstones identified within the gallbladder. The gallbladder wall is thickened, which may be nonspecific in the setting of acute pancreatitis. 3. Fusiform dilatation of the CBD with mild diffuse intrahepatic bile duct dilatation. Enhancement of the bile ducts identified. On the MRCP images the intrahepatic bile ducts have somewhat irregular and beaded appearance. Correlate for any clinical signs symptoms of cholangitis. 4. No definite evidence for choledocholithiasis. However, a tiny focus of low T2 signal is identified on the coronal FIESTA sequence at the convergence of the common bile duct and main pancreatic duct, equivocal for tiny impacted stone at the ampulla. This  is only appreciated on 1 image of 1 sequence and cannot be confirmed on the other sequences. 5. Small hiatal hernia. 6. Mild increase caliber of the jejunum may reflect paralytic ileus secondary to upper abdominal inflammation. Electronically Signed   By: Kerby Moors M.D.   On: 10/18/2022 09:12   CT ABDOMEN PELVIS W CONTRAST  Result Date: 10/17/2022 CLINICAL DATA:  Biliary dilatation seen on ultrasound examination. Right-sided abdominal pain. EXAM: CT ABDOMEN AND PELVIS WITH CONTRAST TECHNIQUE: Multidetector CT imaging of the abdomen and pelvis was performed using the standard protocol following bolus administration of intravenous contrast. RADIATION DOSE REDUCTION: This exam was performed according to the departmental dose-optimization program which includes automated exposure control, adjustment of the mA and/or kV according to patient size and/or use of iterative reconstruction technique. CONTRAST:  140m OMNIPAQUE IOHEXOL 350 MG/ML SOLN  COMPARISON:  Abdominal ultrasound, same date. FINDINGS: Lower chest: Status breathing motion artifact but no pulmonary lesions or pleural effusions. Heart is normal in size. No pericardial effusion. Small hiatal hernia. Hepatobiliary: Moderate intrahepatic biliary dilatation and common bile duct dilatation. Gallbladder demonstrates mild wall thickening. Gallstones are difficult to identified but were present on the ultrasound. No hepatic lesions. The portal and hepatic veins are patent. No obvious obstructing common bile duct stone but recommend MRI abdomen/MRCP without with contrast for further evaluation. Pancreas: Inflammation around the pancreas but no CT findings to suggest acute pancreatitis. No pancreatic ductal dilatation or pancreatic head mass to account for the patient's biliary obstruction. No ampullary lesion is identified either. Spleen: Normal size.  No focal lesions. Adrenals/Urinary Tract: Adrenal glands and kidneys are normal. The bladder is normal. Stomach/Bowel: The stomach is unremarkable. There is inflammation surrounding the second and third portions of the duodenum but I do not think this is a primary duodenal process. The small bowel and colon are unremarkable. The terminal ileum and appendix are normal. No colonic mass or obstruction. Vascular/Lymphatic: The aorta and branch vessels are patent. The major venous structures are patent. No mesenteric or retroperitoneal mass or adenopathy. Small scattered lymph nodes are noted. Reproductive: Surgically absent. Other: No pelvic mass or adenopathy. Small amount of fluid in the anterior pararenal spaces and also in the left pericolic gutter. No inguinal mass or adenopathy. No abdominal wall hernia or subcutaneous lesions. Musculoskeletal: No significant bony findings. IMPRESSION: 1. Moderate intrahepatic biliary dilatation and common bile duct dilatation. No obvious obstructing common bile duct stone but this is still most likely. No pancreatic  head mass or ampullary lesion. Recommend MRI abdomen/MRCP without with contrast for further evaluation. 2. Inflammation around the pancreas and duodenum but no CT findings to suggest acute pancreatitis. 3. Mild gallbladder wall thickening but no pericholecystic fluid. Gallstones were noted on the abdominal ultrasound examination. 4. No other significant abdominal/pelvic findings. Electronically Signed   By: PMarijo SanesM.D.   On: 10/17/2022 11:45   UKoreaAbdomen Limited RUQ (LIVER/GB)  Result Date: 10/17/2022 CLINICAL DATA:  Right upper quadrant pain. EXAM: ULTRASOUND ABDOMEN LIMITED RIGHT UPPER QUADRANT COMPARISON:  08/22/2018. FINDINGS: Gallbladder: Multiple gallstones. Small amount of sludge. No wall thickening or pericholecystic fluid. Common bile duct: Diameter: Dilated to 13 mm.  No visualized duct stone. Liver: Increased parenchymal echogenicity. Mild intrahepatic bile duct dilation. No liver mass. Portal vein is patent on color Doppler imaging with normal direction of blood flow towards the liver. Other: Trace ascites. IMPRESSION: 1. Dilated common bile duct. Mild intrahepatic bile duct dilation. Duct dilation is new compared to the abdomen ultrasound from 08/22/2018.  Consider a distal duct obstruction/stone, which could be further assessed with either ERCP or MRCP. 2. Multiple mobile gallstones with some gallbladder sludge, but no evidence of acute cholecystitis. 3. Increased liver parenchymal echogenicity suggests hepatic steatosis. Electronically Signed   By: Lajean Manes M.D.   On: 10/17/2022 10:15    Review of Systems Blood pressure (!) 132/91, pulse 98, temperature 98 F (36.7 C), temperature source Oral, resp. rate (!) 21, height 5' 9"$  (1.753 m), weight 119.7 kg, SpO2 96 %. Physical Exam  Assessment/Plan: 26F with biliary pancreatitis. HTN  Plan for lap chole likley Tues with Dr. Kae Heller. All risks and benefits were discussed with the patient to generally include: infection, bleeding,  possible need for post op ERCP, damage to the bile ducts, and bile leak. Alternatives were offered and described.  All questions were answered and the patient voiced understanding of the procedure and wishes to proceed at this point with a laparoscopic cholecystectomy 3. Con't abx  This required Moderate medical decision making  Ralene Ok 10/19/2022, 8:49 AM

## 2022-10-19 NOTE — Progress Notes (Signed)
[facility] PROGRESS NOTE   Catherine [name] [id number] DOB: 21-Feb-1971 DOA: 10/17/2022  PCP: Aura Dials, MD  Brief History/Interval Summary:  52 y.o. female with past medical history of essential hypertension, previous history of alcoholism but none in the last 4 years who initially presented to the emergency department about a week ago with concern for chest pain.  During that evaluation she was found to have abnormal LFTs.  However it does not appear the patient was complaining of abdominal pain at that time.  She was asked to see her primary care provider.  She went to her primary care provider's office and had blood work done which showed elevated liver enzymes.  And then that night she started developing abdominal discomfort radiating to the right shoulder.  Found to have elevated lipase level.  She was admitted for further management of acute biliary pancreatitis.    Consultants: Gastroenterology  Procedures: None yet    Subjective/Interval History: Patient mentions that her abdominal pain is 6 out of 10 in intensity today.  She is feeling much better after her procedure yesterday.  Some nausea but no vomiting.  Not passing any gas from below.     Assessment/Plan:  Acute biliary pancreatitis Likely secondary to gallstones.  CT scan showed inflammation around the pancreas without any concerning features.  Patient underwent MRCP which confirmed pancreatitis and suggested cholelithiasis.  Showed dilated bile ducts. Patient was seen by gastroenterology. Patient underwent ERCP on 2/18 and was found to have choledocholithiasis.  She underwent biliary sphincterotomy. Symptomatically patient seems to be improving.  Lipase levels have improved.  Liver function tests are also improving with a decrease in bilirubin from 6.6-2.2. General surgery will be consulted today to consider cholecystectomy. Continue n.p.o. status for now. Elevated WBC noted.  Likely due to  combination of inflammation and possible infection.  Patient remains on ciprofloxacin and metronidazole. MRCP did suggest mild ileus which is noted clinically as well as she has very sluggish bowel sounds.  Abnormal LFTs with cholelithiasis This is all secondary to choledocholithiasis.  Labs are improving this morning after ERCP and biliary sphincterotomy yesterday.  Hepatitis panel is unremarkable.    Hyponatremia and hypokalemia Sodium level has improved.  Potassium level is normal today.  Magnesium was 2.2 yesterday.    Essential hypertension Holding her home medications.  She was on amlodipine, lisinopril HCTZ and metoprolol prior to admission. Blood pressure is reasonably well-controlled.  Mildly elevated beta-hCG She is status post abdominal hysterectomy with bilateral salpingo-oophorectomy.  The level is likely erroneous.  Abnormal EKG Initial EKG suggested prolonged QT interval but it was a misread.  EKG from 2/18 does not show any QT prolongation.  Obesity Estimated body mass index is 38.99 kg/m as calculated from the following:   Height as of this encounter: 5' 9"$  (1.753 m).   Weight as of this encounter: 119.7 kg.  DVT Prophylaxis: Lovenox Code Status: Full code Family Communication: Discussed with the patient Disposition Plan: Hopefully return home when improved.  Start mobilizing.  Status is: Inpatient Remains inpatient appropriate because: Acute pancreatitis     Medications: Scheduled:  enoxaparin (LOVENOX) injection  60 mg Subcutaneous Q24H   Continuous:  sodium chloride 150 mL/hr at 10/19/22 0504   ciprofloxacin 400 mg (10/19/22 0502)   metronidazole 500 mg (10/18/22 2246)   [id number] **OR** acetaminophen, albuterol, hydrALAZINE, HYDROmorphone (DILAUDID) injection, ondansetron **OR** ondansetron (ZOFRAN) IV  Antibiotics: Anti-infectives (From admission, onward)    Start     Dose/Rate Route Frequency Ordered  Stop   10/18/22 1100  metroNIDAZOLE  (FLAGYL) IVPB 500 mg        500 mg 100 mL/hr over 60 Minutes Intravenous Every 12 hours 10/18/22 1010     10/18/22 1100  ciprofloxacin (CIPRO) IVPB 400 mg        400 mg 200 mL/hr over 60 Minutes Intravenous Every 12 hours 10/18/22 1010         Objective:  Vital Signs  Vitals:   10/18/22 2000 10/19/22 0018 10/19/22 0037 10/19/22 0331  BP: 113/81   (!) 132/91  Pulse: (!) 102   98  Resp: 20   (!) 21  Temp: 97.8 F (36.6 C) 97.9 F (36.6 C) 97.8 F (36.6 C) 98 F (36.7 C)  TempSrc: Oral Oral Oral Oral  SpO2: 92%   96%  Weight:      Height:        Intake/Output Summary (Last 24 hours) at 10/19/2022 0808 Last data filed at 10/19/2022 0504 Gross per 24 hour  Intake 1688.81 ml  Output 1 ml  Net 1687.81 ml    Filed Weights   10/17/22 0912  Weight: 119.7 kg    General appearance: Awake alert.  In no distress Resp: Clear to auscultation bilaterally.  Normal effort Cardio: S1-S2 is normal regular.  No S3-S4.  No rubs murmurs or bruit GI: Abdomen is soft.  Tender in the gastrium as well as right upper quadrant.  Some guarding but no rebound or rigidity.  Bowel sounds absent. Extremities: No edema.   Neurologic: Alert and oriented x3.  No focal neurological deficits.    Lab Results:  Data Reviewed: I have personally reviewed following labs and reports of the imaging studies  CBC: Recent Labs  Lab 10/17/22 0928 10/18/22 0745 10/19/22 0412  WBC 17.3* 18.3* 21.7*  HGB 16.0* 12.7 11.4*  HCT 44.7 36.9 32.0*  MCV 88.0 89.8 89.1  PLT 259 229 176     Basic Metabolic Panel: Recent Labs  Lab 10/17/22 0928 10/18/22 0745 10/19/22 0412  NA 131* 133* 135  K 3.4* 2.9* 4.1  CL 95* 101 104  CO2 20* 19* 21*  GLUCOSE 117* 82 110*  BUN 15 13 11  $ CREATININE 0.81 0.72 0.64  CALCIUM 9.1 7.9* 8.3*  MG  --  2.2  --      GFR: Estimated Creatinine Clearance: 115.1 mL/min (by C-G formula based on SCr of 0.64 mg/dL).  Liver Function Tests: Recent Labs  Lab  10/17/22 0928 10/18/22 0745 10/19/22 0412  AST 285* 224* 91*  ALT 613* 487* 311*  ALKPHOS 362* 365* 280*  BILITOT 6.0* 6.6* 2.2*  PROT 7.1 5.8* 5.6*  ALBUMIN 3.6 2.8* 2.5*     Recent Labs  Lab 10/17/22 0928 10/18/22 0745 10/19/22 0412  LIPASE 1,478* 294* 59*      Lipid Profile: Recent Labs    10/17/22 1320  CHOL 299*  HDL 92  LDLCALC 185*  TRIG 112  CHOLHDL 3.3      Radiology Studies: DG ERCP  Result Date: 10/19/2022 CLINICAL DATA:  [id number] Surgery, elective [id number] EXAM: ERCP COMPARISON:  MRCP 10/18/2022.  CT AP, 10/17/2022. FLUOROSCOPY: Exposure Index (as provided by the fluoroscopic device): 27.4 mGy Kerma FINDINGS: Multiple, limited oblique planar images of the RIGHT upper quadrant obtained C-arm. Images demonstrating flexible endoscopy, biliary duct cannulation, sphincterotomy, retrograde cholangiogram and balloon sweep. No biliary ductal dilation. No evidence of biliary filling defect is demonstrated. IMPRESSION: Fluoroscopic imaging for ERCP. No discrete biliary defect is demonstrated For complete  description of intra procedural findings, please see performing service dictation. Electronically Signed   By: Michaelle Birks M.D.   On: 10/19/2022 06:31   MR ABDOMEN MRCP W WO CONTAST  Result Date: 10/18/2022 CLINICAL DATA:  Right upper quadrant pain.  Gallstones. EXAM: MRI ABDOMEN WITHOUT AND WITH CONTRAST (INCLUDING MRCP) TECHNIQUE: Multiplanar multisequence MR imaging of the abdomen was performed both before and after the administration of intravenous contrast. Heavily T2-weighted images of the biliary and pancreatic ducts were obtained, and three-dimensional MRCP images were rendered by post processing. CONTRAST:  52m GADAVIST GADOBUTROL 1 MMOL/ML IV SOLN COMPARISON:  CT AP 10/17/2022 FINDINGS: Lower chest: No acute findings. Hepatobiliary: No suspicious enhancing liver lesions. Sludge and multiple tiny gallstones identified within the gallbladder. The gallbladder wall is  thickened measuring 5 mm. No pericholecystic fluid. Fusiform dilatation of the CBD with mild diffuse intrahepatic bile duct dilatation. The CBD measures up to 1.1 cm. Enhancement of the bile ducts identified. On the MRCP images the intrahepatic bile ducts have somewhat of an irregular and beaded appearance. No definite signs of choledocholithiasis. However, at the convergence of the dilated CBD and normal caliber main pancreatic duct there is a tiny T2 hypointense structure which measures 3 mm, image 21/9. This is seen on only 1 sequence. Small impacted stone at the ampulla cannot be excluded. Pancreas: No signs of main duct dilatation or mass. Diffuse pancreatic edema with peripancreatic fat stranding is identified. Free fluid extends into the retroperitoneum and along the left pericolic gutter. No loculated fluid collections identified to suggest abscess or pseudocyst. Spleen:  Within normal limits in size and appearance. Adrenals/Urinary Tract: No masses identified. No evidence of hydronephrosis. Stomach/Bowel: Small hiatal hernia. Stomach appears nondistended. No pathologic dilatation of the large or small bowel loops. Mild increase caliber of the jejunum may reflect paralytic ileus secondary to upper abdominal inflammation Vascular/Lymphatic: Normal caliber of the abdominal aorta. Upper abdominal vascularity is patent. Prominent upper abdominal lymph nodes are likely reactive. Other: Fat stranding and fluid identified within the retroperitoneum and extending along the pericolic gutters bilaterally. No discrete fluid collection to suggest abscess. Musculoskeletal: No suspicious bone lesions identified. IMPRESSION: 1. Imaging findings compatible with acute pancreatitis. No signs pancreatic necrosis or pseudocyst formation. 2. Sludge and multiple tiny gallstones identified within the gallbladder. The gallbladder wall is thickened, which may be nonspecific in the setting of acute pancreatitis. 3. Fusiform  dilatation of the CBD with mild diffuse intrahepatic bile duct dilatation. Enhancement of the bile ducts identified. On the MRCP images the intrahepatic bile ducts have somewhat irregular and beaded appearance. Correlate for any clinical signs symptoms of cholangitis. 4. No definite evidence for choledocholithiasis. However, a tiny focus of low T2 signal is identified on the coronal FIESTA sequence at the convergence of the common bile duct and main pancreatic duct, equivocal for tiny impacted stone at the ampulla. This is only appreciated on 1 image of 1 sequence and cannot be confirmed on the other sequences. 5. Small hiatal hernia. 6. Mild increase caliber of the jejunum may reflect paralytic ileus secondary to upper abdominal inflammation. Electronically Signed   By: TKerby MoorsM.D.   On: 10/18/2022 09:12   CT ABDOMEN PELVIS W CONTRAST  Result Date: 10/17/2022 CLINICAL DATA:  Biliary dilatation seen on ultrasound examination. Right-sided abdominal pain. EXAM: CT ABDOMEN AND PELVIS WITH CONTRAST TECHNIQUE: Multidetector CT imaging of the abdomen and pelvis was performed using the standard protocol following bolus administration of intravenous contrast. RADIATION DOSE REDUCTION: This exam was  performed according to the departmental dose-optimization program which includes automated exposure control, adjustment of the mA and/or kV according to patient size and/or use of iterative reconstruction technique. CONTRAST:  123m OMNIPAQUE IOHEXOL 350 MG/ML SOLN COMPARISON:  Abdominal ultrasound, same date. FINDINGS: Lower chest: Status breathing motion artifact but no pulmonary lesions or pleural effusions. Heart is normal in size. No pericardial effusion. Small hiatal hernia. Hepatobiliary: Moderate intrahepatic biliary dilatation and common bile duct dilatation. Gallbladder demonstrates mild wall thickening. Gallstones are difficult to identified but were present on the ultrasound. No hepatic lesions. The portal  and hepatic veins are patent. No obvious obstructing common bile duct stone but recommend MRI abdomen/MRCP without with contrast for further evaluation. Pancreas: Inflammation around the pancreas but no CT findings to suggest acute pancreatitis. No pancreatic ductal dilatation or pancreatic head mass to account for the patient's biliary obstruction. No ampullary lesion is identified either. Spleen: Normal size.  No focal lesions. Adrenals/Urinary Tract: Adrenal glands and kidneys are normal. The bladder is normal. Stomach/Bowel: The stomach is unremarkable. There is inflammation surrounding the second and third portions of the duodenum but I do not think this is a primary duodenal process. The small bowel and colon are unremarkable. The terminal ileum and appendix are normal. No colonic mass or obstruction. Vascular/Lymphatic: The aorta and branch vessels are patent. The major venous structures are patent. No mesenteric or retroperitoneal mass or adenopathy. Small scattered lymph nodes are noted. Reproductive: Surgically absent. Other: No pelvic mass or adenopathy. Small amount of fluid in the anterior pararenal spaces and also in the left pericolic gutter. No inguinal mass or adenopathy. No abdominal wall hernia or subcutaneous lesions. Musculoskeletal: No significant bony findings. IMPRESSION: 1. Moderate intrahepatic biliary dilatation and common bile duct dilatation. No obvious obstructing common bile duct stone but this is still most likely. No pancreatic head mass or ampullary lesion. Recommend MRI abdomen/MRCP without with contrast for further evaluation. 2. Inflammation around the pancreas and duodenum but no CT findings to suggest acute pancreatitis. 3. Mild gallbladder wall thickening but no pericholecystic fluid. Gallstones were noted on the abdominal ultrasound examination. 4. No other significant abdominal/pelvic findings. Electronically Signed   By: PMarijo SanesM.D.   On: 10/17/2022 11:45   [place] Abdomen Limited RUQ (LIVER/GB)  Result Date: 10/17/2022 CLINICAL DATA:  Right upper quadrant pain. EXAM: ULTRASOUND ABDOMEN LIMITED RIGHT UPPER QUADRANT COMPARISON:  08/22/2018. FINDINGS: Gallbladder: Multiple gallstones. Small amount of sludge. No wall thickening or pericholecystic fluid. Common bile duct: Diameter: Dilated to 13 mm.  No visualized duct stone. Liver: Increased parenchymal echogenicity. Mild intrahepatic bile duct dilation. No liver mass. Portal vein is patent on color Doppler imaging with normal direction of blood flow towards the liver. Other: Trace ascites. IMPRESSION: 1. Dilated common bile duct. Mild intrahepatic bile duct dilation. Duct dilation is new compared to the abdomen ultrasound from 08/22/2018. Consider a distal duct obstruction/stone, which could be further assessed with either ERCP or MRCP. 2. Multiple mobile gallstones with some gallbladder sludge, but no evidence of acute cholecystitis. 3. Increased liver parenchymal echogenicity suggests hepatic steatosis. Electronically Signed   By: DLajean ManesM.D.   On: 10/17/2022 10:15       LOS: 2 days   [facility] Pager on [email or web address]  10/19/2022, 8:08 AM

## 2022-10-20 ENCOUNTER — Encounter (HOSPITAL_COMMUNITY): Payer: Self-pay | Admitting: Gastroenterology

## 2022-10-20 DIAGNOSIS — I10 Essential (primary) hypertension: Secondary | ICD-10-CM | POA: Diagnosis not present

## 2022-10-20 DIAGNOSIS — K851 Biliary acute pancreatitis without necrosis or infection: Secondary | ICD-10-CM | POA: Diagnosis not present

## 2022-10-20 DIAGNOSIS — K8021 Calculus of gallbladder without cholecystitis with obstruction: Secondary | ICD-10-CM | POA: Diagnosis not present

## 2022-10-20 LAB — COMPREHENSIVE METABOLIC PANEL
ALT: 242 U/L — ABNORMAL HIGH (ref 0–44)
AST: 48 U/L — ABNORMAL HIGH (ref 15–41)
Albumin: 2.9 g/dL — ABNORMAL LOW (ref 3.5–5.0)
Alkaline Phosphatase: 257 U/L — ABNORMAL HIGH (ref 38–126)
Anion gap: 11 (ref 5–15)
BUN: 7 mg/dL (ref 6–20)
CO2: 20 mmol/L — ABNORMAL LOW (ref 22–32)
Calcium: 8.5 mg/dL — ABNORMAL LOW (ref 8.9–10.3)
Chloride: 103 mmol/L (ref 98–111)
Creatinine, Ser: 0.67 mg/dL (ref 0.44–1.00)
GFR, Estimated: >60 mL/min (ref >60)
Glucose, Bld: 68 mg/dL — ABNORMAL LOW (ref 70–99)
Potassium: 3 mmol/L — ABNORMAL LOW (ref 3.5–5.1)
Sodium: 134 mmol/L — ABNORMAL LOW (ref 135–145)
Total Bilirubin: 2.1 mg/dL — ABNORMAL HIGH (ref 0.3–1.2)
Total Protein: 6.4 g/dL — ABNORMAL LOW (ref 6.5–8.1)

## 2022-10-20 LAB — GLUCOSE, CAPILLARY
Glucose-Capillary: 52 mg/dL — ABNORMAL LOW (ref 70–99)
Glucose-Capillary: 91 mg/dL (ref 70–99)
Glucose-Capillary: 81 mg/dL (ref 70–99)

## 2022-10-20 LAB — CBC
HCT: 35.2 % — ABNORMAL LOW (ref 36.0–46.0)
Hemoglobin: 12.6 g/dL (ref 12.0–15.0)
MCH: 31.8 pg (ref 26.0–34.0)
MCHC: 35.8 g/dL (ref 30.0–36.0)
MCV: 88.9 fL (ref 80.0–100.0)
Platelets: 216 10*3/uL (ref 150–400)
RBC: 3.96 MIL/uL (ref 3.87–5.11)
RDW: 14.6 % (ref 11.5–15.5)
WBC: 21.3 10*3/uL — ABNORMAL HIGH (ref 4.0–10.5)
nRBC: 0 % (ref 0.0–0.2)

## 2022-10-20 LAB — MAGNESIUM: Magnesium: 1.8 mg/dL (ref 1.7–2.4)

## 2022-10-20 LAB — LIPASE, BLOOD: Lipase: 33 U/L (ref 11–51)

## 2022-10-20 MED ORDER — DEXTROSE 50 % IV SOLN: 25.0000 g | INTRAVENOUS | Status: AC

## 2022-10-20 MED ORDER — DEXTROSE 50 % IV SOLN
25.0000 mL | Freq: Once | INTRAVENOUS | Status: AC
  Administered 2022-10-20: 25 mL via INTRAVENOUS
  Filled 2022-10-20: qty 50

## 2022-10-20 MED ORDER — KCL IN DEXTROSE-NACL 20-5-0.9 MEQ/L-%-% IV SOLN
INTRAVENOUS | Status: DC
  Filled 2022-10-20 (×8): qty 1000

## 2022-10-20 MED ORDER — POTASSIUM CHLORIDE 10 MEQ/100ML IV SOLN
10.0000 meq | INTRAVENOUS | Status: AC
  Administered 2022-10-20 (×4): 10 meq via INTRAVENOUS
  Filled 2022-10-20 (×4): qty 100

## 2022-10-20 MED ORDER — DEXTROSE 50 % IV SOLN
1.0000 | Freq: Three times a day (TID) | INTRAVENOUS | Status: DC | PRN
  Administered 2022-10-20: 50 mL via INTRAVENOUS
  Filled 2022-10-20: qty 50

## 2022-10-20 NOTE — Progress Notes (Signed)
[facility] PROGRESS NOTE   NIKALA NGHIEM [id number] DOB: 14-Mar-1971 DOA: 10/17/2022  PCP: Aura Dials, MD  Brief History/Interval Summary:  52 y.o. female with past medical history of essential hypertension, previous history of alcoholism but none in the last 4 years who initially presented to the emergency department about a week ago with concern for chest pain.  During that evaluation she was found to have abnormal LFTs.  However it does not appear the patient was complaining of abdominal pain at that time.  She was asked to see her primary care provider.  She went to her primary care provider's office and had blood work done which showed elevated liver enzymes.  And then that night she started developing abdominal discomfort radiating to the right shoulder.  Found to have elevated lipase level.  She was admitted for further management of acute biliary pancreatitis.    Consultants: Gastroenterology.  General surgery  Procedures:   ERCP 2/18 Impression:               - The major papilla appeared to be slightly bulging.                           - Choledocholithiasis was found. Complete removal                            was accomplished by biliary sphincterotomy and                            balloon extraction.                           - A biliary sphincterotomy was performed.                           - The biliary tree was swept and nothing was found                            the end of the procedure.    Subjective/Interval History: Patient experiencing pain this morning.  7 out of 10 in intensity.  Some nausea but no vomiting.  Still not passing any gas.  No chest pain or shortness of breath.     Assessment/Plan:  Acute biliary pancreatitis/ileus Likely secondary to gallstones.  CT scan showed inflammation around the pancreas without any concerning features.  Patient underwent MRCP which confirmed pancreatitis and suggested cholelithiasis.  Showed dilated  bile ducts. Patient was seen by gastroenterology. Patient underwent ERCP on 2/18 and was found to have choledocholithiasis.  She underwent biliary sphincterotomy. Lipase level has improved.  Patient experiencing pain.  She appears to have mild ileus. General surgery was consulted.  Plan is for cholecystectomy today. Elevated WBCs noted.  Continue with ciprofloxacin and metronidazole.  Abnormal LFTs with cholelithiasis This is all secondary to choledocholithiasis.  Labs are improving after biliary sphincterotomy.  Bilirubin is down to 2.1.  Alkaline phosphatase is also improved.  Hepatitis panel is unremarkable.    Hyponatremia and hypokalemia Continue to monitor electrolytes.  Replace potassium.  Magnesium is 1.8.    Hypoglycemia Secondary to poor oral intake.  Will give D50.  Start D5 infusion.  Essential hypertension Holding her home medications.  She was on amlodipine, lisinopril HCTZ and  metoprolol prior to admission. Blood pressure is reasonably well-controlled.  Mildly elevated beta-hCG She is status post abdominal hysterectomy with bilateral salpingo-oophorectomy.  The level is likely erroneous.  Abnormal EKG Initial EKG suggested prolonged QT interval but it was a misread.  EKG from 2/18 does not show any QT prolongation.  Obesity Estimated body mass index is 38.99 kg/m as calculated from the following:   Height as of this encounter: 5' 9"$  (1.753 m).   Weight as of this encounter: 119.7 kg.  DVT Prophylaxis: Lovenox Code Status: Full code Family Communication: Discussed with the patient Disposition Plan: Hopefully return home when improved.  Start mobilizing.  Status is: Inpatient Remains inpatient appropriate because: Acute pancreatitis     Medications: Scheduled:  enoxaparin (LOVENOX) injection  60 mg Subcutaneous Q24H   Continuous:  ciprofloxacin 400 mg (10/20/22 0419)   dextrose 5 % and 0.9 % NaCl with KCl 20 mEq/L 100 mL/hr at 10/20/22 0744    metronidazole 500 mg (10/19/22 2103)   potassium chloride 10 mEq (10/20/22 0705)   [id number] **OR** acetaminophen, albuterol, hydrALAZINE, HYDROmorphone (DILAUDID) injection, ondansetron **OR** ondansetron (ZOFRAN) IV  Antibiotics: Anti-infectives (From admission, onward)    Start     Dose/Rate Route Frequency Ordered Stop   10/18/22 1100  metroNIDAZOLE (FLAGYL) IVPB 500 mg        500 mg 100 mL/hr over 60 Minutes Intravenous Every 12 hours 10/18/22 1010     10/18/22 1100  ciprofloxacin (CIPRO) IVPB 400 mg        400 mg 200 mL/hr over 60 Minutes Intravenous Every 12 hours 10/18/22 1010         Objective:  Vital Signs  Vitals:   10/19/22 2226 10/19/22 2313 10/20/22 0339 10/20/22 0757  BP:  (!) 141/91 (!) 134/96 (!) 127/93  Pulse: 100 92 98 97  Resp: 18 18 (!) 21 20  Temp:  98 F (36.7 C) 98.5 F (36.9 C) 97.6 F (36.4 C)  TempSrc:  Oral Oral Oral  SpO2: 93% 97% 94% 100%  Weight:      Height:       No intake or output data in the 24 hours ending 10/20/22 0821  Filed Weights   10/17/22 0912  Weight: 119.7 kg    General appearance: Awake alert.  In no distress Resp: Clear to auscultation bilaterally.  Normal effort Cardio: S1-S2 is normal regular.  No S3-S4.  No rubs murmurs or bruit GI: Abdomen is soft.  Tender in the epigastrium as well as right upper quadrant.  No rebound rigidity or guarding.  Bowel sounds absent. Extremities: No edema.  Full range of motion of lower extremities. Neurologic: Alert and oriented x3.  No focal neurological deficits.    Lab Results:  Data Reviewed: I have personally reviewed following labs and reports of the imaging studies  CBC: Recent Labs  Lab 10/17/22 0928 10/18/22 0745 10/19/22 0412 10/20/22 0312  WBC 17.3* 18.3* 21.7* 21.3*  HGB 16.0* 12.7 11.4* 12.6  HCT 44.7 36.9 32.0* 35.2*  MCV 88.0 89.8 89.1 88.9  PLT 259 229 176 216     Basic Metabolic Panel: Recent Labs  Lab 10/17/22 0928 10/18/22 0745  10/19/22 0412 10/20/22 0312  NA 131* 133* 135 134*  K 3.4* 2.9* 4.1 3.0*  CL 95* 101 104 103  CO2 20* 19* 21* 20*  GLUCOSE 117* 82 110* 68*  BUN 15 13 11 7  $ CREATININE 0.81 0.72 0.64 0.67  CALCIUM 9.1 7.9* 8.3* 8.5*  MG  --  2.2  --  1.8     GFR: Estimated Creatinine Clearance: 115.1 mL/min (by C-G formula based on SCr of 0.67 mg/dL).  Liver Function Tests: Recent Labs  Lab 10/17/22 0928 10/18/22 0745 10/19/22 0412 10/20/22 0312  AST 285* 224* 91* 48*  ALT 613* 487* 311* 242*  ALKPHOS 362* 365* 280* 257*  BILITOT 6.0* 6.6* 2.2* 2.1*  PROT 7.1 5.8* 5.6* 6.4*  ALBUMIN 3.6 2.8* 2.5* 2.9*     Recent Labs  Lab 10/17/22 0928 10/18/22 0745 10/19/22 0412 10/20/22 0312  LIPASE 1,478* 294* 59* 33      Lipid Profile: Recent Labs    10/17/22 1320  CHOL 299*  HDL 92  LDLCALC 185*  TRIG 112  CHOLHDL 3.3      Radiology Studies: DG ERCP  Result Date: 10/19/2022 CLINICAL DATA:  [id number] Surgery, elective [id number] EXAM: ERCP COMPARISON:  MRCP 10/18/2022.  CT AP, 10/17/2022. FLUOROSCOPY: Exposure Index (as provided by the fluoroscopic device): 27.4 mGy Kerma FINDINGS: Multiple, limited oblique planar images of the RIGHT upper quadrant obtained C-arm. Images demonstrating flexible endoscopy, biliary duct cannulation, sphincterotomy, retrograde cholangiogram and balloon sweep. No biliary ductal dilation. No evidence of biliary filling defect is demonstrated. IMPRESSION: Fluoroscopic imaging for ERCP. No discrete biliary defect is demonstrated For complete description of intra procedural findings, please see performing service dictation. Electronically Signed   By: Michaelle Birks M.D.   On: 10/19/2022 06:31       LOS: 3 days   [facility] Pager on [email or web address]  10/20/2022, 8:21 AM

## 2022-10-20 NOTE — Progress Notes (Signed)
2 Days Post-Op   Subjective/Chief Complaint: Has had ongoing significant pain requiring escalation in narcotic treatment.   Objective: Vital signs in last 24 hours: Temp:  [97.6 F (36.4 C)-98.5 F (36.9 C)] 97.6 F (36.4 C) (02/20 1151) Pulse Rate:  [88-100] 100 (02/20 1151) Resp:  [18-23] 23 (02/20 1151) BP: (127-148)/(91-102) 148/102 (02/20 1151) SpO2:  [93 %-100 %] 94 % (02/20 1151) Last BM Date : 10/15/22  Intake/Output from previous day: No intake/output data recorded. Intake/Output this shift: Total I/O In: 240 [P.O.:240] Out: -   Alert, calm, cooperative Unlabored respirations Abdomen is soft, mildly distended, exquisitely tender with voluntary guarding in the epigastric region, mild right upper quadrant tenderness  Lab Results:  Recent Labs    10/19/22 0412 10/20/22 0312  WBC 21.7* 21.3*  HGB 11.4* 12.6  HCT 32.0* 35.2*  PLT 176 216   BMET Recent Labs    10/19/22 0412 10/20/22 0312  NA 135 134*  K 4.1 3.0*  CL 104 103  CO2 21* 20*  GLUCOSE 110* 68*  BUN 11 7  CREATININE 0.64 0.67  CALCIUM 8.3* 8.5*   PT/INR No results for input(s): "LABPROT", "INR" in the last 72 hours. ABG No results for input(s): "PHART", "HCO3" in the last 72 hours.  Invalid input(s): "PCO2", "PO2"  Studies/Results: No results found.  Anti-infectives: Anti-infectives (From admission, onward)    Start     Dose/Rate Route Frequency Ordered Stop   10/18/22 1100  metroNIDAZOLE (FLAGYL) IVPB 500 mg        500 mg 100 mL/hr over 60 Minutes Intravenous Every 12 hours 10/18/22 1010     10/18/22 1100  ciprofloxacin (CIPRO) IVPB 400 mg        400 mg 200 mL/hr over 60 Minutes Intravenous Every 12 hours 10/18/22 1010         Assessment/Plan: s/p Procedure(s): ENDOSCOPIC RETROGRADE CHOLANGIOPANCREATOGRAPHY (ERCP) (N/A) SPHINCTEROTOMY REMOVAL OF STONES  While her lipase has normalized, she appears to have ongoing pancreatitis symptoms including severe epigastric pain and  leukocytosis.  Her LFTs are downtrending.  She does have mild right upper quadrant tenderness as well.  Will hold off on laparoscopic cholecystectomy today and reassess tomorrow, hopefully her symptoms will be improving.   LOS: 3 days    Catherine [name] 10/20/2022

## 2022-10-21 ENCOUNTER — Encounter (HOSPITAL_COMMUNITY): Payer: Self-pay | Admitting: Internal Medicine

## 2022-10-21 ENCOUNTER — Encounter (HOSPITAL_COMMUNITY)
Admission: EM | Disposition: A | Discharge status: Home / Self Care | Payer: Self-pay | Source: Home / Self Care | Attending: Internal Medicine

## 2022-10-21 ENCOUNTER — Inpatient Hospital Stay (HOSPITAL_COMMUNITY): Payer: BC Managed Care – PPO | Admitting: Anesthesiology

## 2022-10-21 DIAGNOSIS — K851 Biliary acute pancreatitis without necrosis or infection: Secondary | ICD-10-CM | POA: Diagnosis not present

## 2022-10-21 HISTORY — PX: CHOLECYSTECTOMY: SHX55

## 2022-10-21 LAB — COMPREHENSIVE METABOLIC PANEL
ALT: 131 U/L — ABNORMAL HIGH (ref 0–44)
AST: 23 U/L (ref 15–41)
Albumin: 2.2 g/dL — ABNORMAL LOW (ref 3.5–5.0)
Alkaline Phosphatase: 165 U/L — ABNORMAL HIGH (ref 38–126)
Anion gap: 11 (ref 5–15)
BUN: <5 mg/dL — ABNORMAL LOW (ref 6–20)
CO2: 19 mmol/L — ABNORMAL LOW (ref 22–32)
Calcium: 7.9 mg/dL — ABNORMAL LOW (ref 8.9–10.3)
Chloride: 103 mmol/L (ref 98–111)
Creatinine, Ser: 0.48 mg/dL (ref 0.44–1.00)
GFR, Estimated: >60 mL/min (ref >60)
Glucose, Bld: 93 mg/dL (ref 70–99)
Potassium: 3 mmol/L — ABNORMAL LOW (ref 3.5–5.1)
Sodium: 133 mmol/L — ABNORMAL LOW (ref 135–145)
Total Bilirubin: 1.4 mg/dL — ABNORMAL HIGH (ref 0.3–1.2)
Total Protein: 5.4 g/dL — ABNORMAL LOW (ref 6.5–8.1)

## 2022-10-21 LAB — GLUCOSE, CAPILLARY
Glucose-Capillary: 111 mg/dL — ABNORMAL HIGH (ref 70–99)
Glucose-Capillary: 88 mg/dL (ref 70–99)
Glucose-Capillary: 92 mg/dL (ref 70–99)
Glucose-Capillary: 98 mg/dL (ref 70–99)

## 2022-10-21 LAB — CBC
HCT: 31.7 % — ABNORMAL LOW (ref 36.0–46.0)
Hemoglobin: 10.9 g/dL — ABNORMAL LOW (ref 12.0–15.0)
MCH: 31.2 pg (ref 26.0–34.0)
MCHC: 34.4 g/dL (ref 30.0–36.0)
MCV: 90.8 fL (ref 80.0–100.0)
Platelets: 230 10*3/uL (ref 150–400)
RBC: 3.49 MIL/uL — ABNORMAL LOW (ref 3.87–5.11)
RDW: 14.9 % (ref 11.5–15.5)
WBC: 21 10*3/uL — ABNORMAL HIGH (ref 4.0–10.5)
nRBC: 0 % (ref 0.0–0.2)

## 2022-10-21 LAB — LIPASE, BLOOD: Lipase: 24 U/L (ref 11–51)

## 2022-10-21 SURGERY — LAPAROSCOPIC CHOLECYSTECTOMY
Anesthesia: General | Site: Abdomen

## 2022-10-21 MED ORDER — CHLORHEXIDINE GLUCONATE 0.12 % MT SOLN: 15.0000 mL | Freq: Once | OROMUCOSAL | Status: AC

## 2022-10-21 MED ORDER — SUGAMMADEX SODIUM 200 MG/2ML IV SOLN
INTRAVENOUS | Status: DC | PRN
  Administered 2022-10-21: 200 mg via INTRAVENOUS

## 2022-10-21 MED ORDER — ROCURONIUM BROMIDE 10 MG/ML (PF) SYRINGE
PREFILLED_SYRINGE | INTRAVENOUS | Status: DC | PRN
  Administered 2022-10-21: 60 mg via INTRAVENOUS
  Administered 2022-10-21: 20 mg via INTRAVENOUS

## 2022-10-21 MED ORDER — PROPOFOL 10 MG/ML IV BOLUS
INTRAVENOUS | Status: DC | PRN
  Administered 2022-10-21: 150 mg via INTRAVENOUS
  Administered 2022-10-21: 50 mg via INTRAVENOUS

## 2022-10-21 MED ORDER — FENTANYL CITRATE (PF) 100 MCG/2ML IJ SOLN
25.0000 ug | INTRAMUSCULAR | Status: DC | PRN
  Administered 2022-10-21: 50 ug via INTRAVENOUS

## 2022-10-21 MED ORDER — ROCURONIUM BROMIDE 10 MG/ML (PF) SYRINGE
PREFILLED_SYRINGE | INTRAVENOUS | Status: AC
  Filled 2022-10-21: qty 30

## 2022-10-21 MED ORDER — FENTANYL CITRATE (PF) 250 MCG/5ML IJ SOLN
INTRAMUSCULAR | Status: AC
  Filled 2022-10-21: qty 5

## 2022-10-21 MED ORDER — LIDOCAINE 2% (20 MG/ML) 5 ML SYRINGE
INTRAMUSCULAR | Status: DC | PRN
  Administered 2022-10-21: 100 mg via INTRAVENOUS

## 2022-10-21 MED ORDER — DEXAMETHASONE SODIUM PHOSPHATE 10 MG/ML IJ SOLN
INTRAMUSCULAR | Status: DC | PRN
  Administered 2022-10-21: 10 mg via INTRAVENOUS

## 2022-10-21 MED ORDER — KETOROLAC TROMETHAMINE 30 MG/ML IJ SOLN: 30.0000 mg | Freq: Once | INTRAMUSCULAR | Status: DC | PRN

## 2022-10-21 MED ORDER — CHLORHEXIDINE GLUCONATE 0.12 % MT SOLN
OROMUCOSAL | Status: AC
  Administered 2022-10-21: 15 mL via OROMUCOSAL
  Filled 2022-10-21: qty 15

## 2022-10-21 MED ORDER — FENTANYL CITRATE (PF) 250 MCG/5ML IJ SOLN
INTRAMUSCULAR | Status: DC | PRN
  Administered 2022-10-21: 50 ug via INTRAVENOUS
  Administered 2022-10-21 (×2): 100 ug via INTRAVENOUS

## 2022-10-21 MED ORDER — LIDOCAINE 2% (20 MG/ML) 5 ML SYRINGE
INTRAMUSCULAR | Status: AC
  Filled 2022-10-21: qty 10

## 2022-10-21 MED ORDER — HEMOSTATIC AGENTS (NO CHARGE) OPTIME
TOPICAL | Status: DC | PRN
  Administered 2022-10-21: 1 via TOPICAL

## 2022-10-21 MED ORDER — BUPIVACAINE HCL (PF) 0.25 % IJ SOLN
INTRAMUSCULAR | Status: AC
  Filled 2022-10-21: qty 30

## 2022-10-21 MED ORDER — BUPIVACAINE HCL 0.25 % IJ SOLN
INTRAMUSCULAR | Status: DC | PRN
  Administered 2022-10-21: 13 mL

## 2022-10-21 MED ORDER — MIDAZOLAM HCL 2 MG/2ML IJ SOLN
INTRAMUSCULAR | Status: DC | PRN
  Administered 2022-10-21: 2 mg via INTRAVENOUS

## 2022-10-21 MED ORDER — OXYCODONE HCL 5 MG/5ML PO SOLN: 5.0000 mg | Freq: Once | ORAL | Status: DC | PRN

## 2022-10-21 MED ORDER — OXYCODONE HCL 5 MG PO TABS: 5.0000 mg | ORAL_TABLET | Freq: Once | ORAL | Status: DC | PRN

## 2022-10-21 MED ORDER — ONDANSETRON HCL 4 MG/2ML IJ SOLN
INTRAMUSCULAR | Status: DC | PRN
  Administered 2022-10-21: 4 mg via INTRAVENOUS

## 2022-10-21 MED ORDER — MIDAZOLAM HCL 2 MG/2ML IJ SOLN
INTRAMUSCULAR | Status: AC
  Filled 2022-10-21: qty 2

## 2022-10-21 MED ORDER — FENTANYL CITRATE (PF) 100 MCG/2ML IJ SOLN
INTRAMUSCULAR | Status: AC
  Filled 2022-10-21: qty 2

## 2022-10-21 MED ORDER — ONDANSETRON HCL 4 MG/2ML IJ SOLN: 4.0000 mg | Freq: Once | INTRAMUSCULAR | Status: DC | PRN

## 2022-10-21 MED ORDER — PROPOFOL 10 MG/ML IV BOLUS
INTRAVENOUS | Status: AC
  Filled 2022-10-21: qty 20

## 2022-10-21 MED ORDER — PHENYLEPHRINE 80 MCG/ML (10ML) SYRINGE FOR IV PUSH (FOR BLOOD PRESSURE SUPPORT)
PREFILLED_SYRINGE | INTRAVENOUS | Status: AC
  Filled 2022-10-21: qty 20

## 2022-10-21 MED ORDER — SODIUM CHLORIDE 0.9 % IR SOLN
Status: DC | PRN
  Administered 2022-10-21: 1000 mL

## 2022-10-21 MED ORDER — ORAL CARE MOUTH RINSE: 15.0000 mL | Freq: Once | OROMUCOSAL | Status: AC

## 2022-10-21 MED ORDER — DROPERIDOL 2.5 MG/ML IJ SOLN
INTRAMUSCULAR | Status: DC | PRN
  Administered 2022-10-21: .625 mg via INTRAVENOUS

## 2022-10-21 MED ORDER — 0.9 % SODIUM CHLORIDE (POUR BTL) OPTIME
TOPICAL | Status: DC | PRN
  Administered 2022-10-21: 1000 mL

## 2022-10-21 MED ORDER — LACTATED RINGERS IV SOLN: INTRAVENOUS | Status: DC

## 2022-10-21 SURGICAL SUPPLY — 44 items
APPLIER CLIP 5 13 M/L LIGAMAX5 (MISCELLANEOUS) ×1
BAG COUNTER SPONGE SURGICOUNT (BAG) ×1 IMPLANT
BIOPATCH RED 1 DISK 7.0 (GAUZE/BANDAGES/DRESSINGS) IMPLANT
BLADE CLIPPER SURG (BLADE) IMPLANT
CANISTER SUCT 3000ML PPV (MISCELLANEOUS) ×1 IMPLANT
CHLORAPREP W/TINT 26 (MISCELLANEOUS) ×1 IMPLANT
CLIP APPLIE 5 13 M/L LIGAMAX5 (MISCELLANEOUS) ×1 IMPLANT
COVER SURGICAL LIGHT HANDLE (MISCELLANEOUS) ×1 IMPLANT
DERMABOND ADVANCED .7 DNX12 (GAUZE/BANDAGES/DRESSINGS) ×1 IMPLANT
DRAIN CHANNEL 19F RND (DRAIN) IMPLANT
DRSG IV TEGADERM 3.5X4.5 STRL (GAUZE/BANDAGES/DRESSINGS) IMPLANT
ELECT REM PT RETURN 9FT ADLT (ELECTROSURGICAL) ×1
ELECTRODE REM PT RTRN 9FT ADLT (ELECTROSURGICAL) ×1 IMPLANT
ENDOLOOP SUT PDS II  0 18 (SUTURE) ×1
ENDOLOOP SUT PDS II 0 18 (SUTURE) IMPLANT
EVACUATOR SILICONE 100CC (DRAIN) IMPLANT
GLOVE BIO SURGEON STRL SZ 6 (GLOVE) ×1 IMPLANT
GLOVE INDICATOR 6.5 STRL GRN (GLOVE) ×1 IMPLANT
GOWN STRL REUS W/ TWL LRG LVL3 (GOWN DISPOSABLE) ×3 IMPLANT
GOWN STRL REUS W/TWL LRG LVL3 (GOWN DISPOSABLE) ×2
GRASPER SUT TROCAR 14GX15 (MISCELLANEOUS) ×1 IMPLANT
HEMOSTAT SNOW SURGICEL 2X4 (HEMOSTASIS) IMPLANT
IRRIG SUCT STRYKERFLOW 2 WTIP (MISCELLANEOUS) ×1
IRRIGATION SUCT STRKRFLW 2 WTP (MISCELLANEOUS) ×1 IMPLANT
KIT BASIN OR (CUSTOM PROCEDURE TRAY) ×1 IMPLANT
KIT TURNOVER KIT B (KITS) ×1 IMPLANT
NDL INSUFFLATION 14GA 120MM (NEEDLE) ×1 IMPLANT
NEEDLE INSUFFLATION 14GA 120MM (NEEDLE) ×1 IMPLANT
NS IRRIG 1000ML POUR BTL (IV SOLUTION) ×1 IMPLANT
PAD ARMBOARD 7.5X6 YLW CONV (MISCELLANEOUS) ×1 IMPLANT
SCISSORS LAP 5X35 DISP (ENDOMECHANICALS) ×1 IMPLANT
SET TUBE SMOKE EVAC HIGH FLOW (TUBING) ×1 IMPLANT
SLEEVE Z-THREAD 5X100MM (TROCAR) ×2 IMPLANT
SPECIMEN JAR SMALL (MISCELLANEOUS) ×1 IMPLANT
SUT MNCRL AB 4-0 PS2 18 (SUTURE) ×1 IMPLANT
SYS BAG RETRIEVAL 10MM (BASKET) ×1
SYSTEM BAG RETRIEVAL 10MM (BASKET) ×1 IMPLANT
TOWEL GREEN STERILE (TOWEL DISPOSABLE) IMPLANT
TOWEL GREEN STERILE FF (TOWEL DISPOSABLE) ×1 IMPLANT
TRAY LAPAROSCOPIC MC (CUSTOM PROCEDURE TRAY) ×1 IMPLANT
TROCAR 11X100 Z THREAD (TROCAR) ×1 IMPLANT
TROCAR XCEL NON-BLD 5MMX100MML (ENDOMECHANICALS) IMPLANT
TROCAR Z-THREAD OPTICAL 5X100M (TROCAR) ×1 IMPLANT
WATER STERILE IRR 1000ML POUR (IV SOLUTION) ×1 IMPLANT

## 2022-10-21 NOTE — Progress Notes (Signed)
3 Days Post-Op   Subjective/Chief Complaint: Reports her pain level has significantly decreased, she was able to sleep throughout the night and reports that her Dilaudid requirement was lower.   Objective: Vital signs in last 24 hours: Temp:  [97.6 F (36.4 C)-98.7 F (37.1 C)] 97.6 F (36.4 C) (02/21 0759) Pulse Rate:  [97-108] 100 (02/21 0759) Resp:  [18-23] 19 (02/21 0759) BP: (132-148)/(85-102) 137/99 (02/21 0759) SpO2:  [94 %-98 %] 98 % (02/21 0759) Last BM Date : 10/15/22  Intake/Output from previous day: 02/20 0701 - 02/21 0700 In: 2339.2 [P.O.:240; I.V.:1002.6; IV Piggyback:1096.6] Out: -  Intake/Output this shift: No intake/output data recorded.  Alert, calm, cooperative Unlabored respirations Abdomen is soft, mildly distended, mildly tender without guarding in the epigastric region, mild right upper quadrant tenderness.  This is improved compared to yesterday.  Lab Results:  Recent Labs    10/20/22 0312 10/21/22 0420  WBC 21.3* 21.0*  HGB 12.6 10.9*  HCT 35.2* 31.7*  PLT 216 230    BMET Recent Labs    10/20/22 0312 10/21/22 0420  NA 134* 133*  K 3.0* 3.0*  CL 103 103  CO2 20* 19*  GLUCOSE 68* 93  BUN 7 <5*  CREATININE 0.67 0.48  CALCIUM 8.5* 7.9*    PT/INR No results for input(s): "LABPROT", "INR" in the last 72 hours. ABG No results for input(s): "PHART", "HCO3" in the last 72 hours.  Invalid input(s): "PCO2", "PO2"  Studies/Results: No results found.  Anti-infectives: Anti-infectives (From admission, onward)    Start     Dose/Rate Route Frequency Ordered Stop   10/18/22 1100  metroNIDAZOLE (FLAGYL) IVPB 500 mg        500 mg 100 mL/hr over 60 Minutes Intravenous Every 12 hours 10/18/22 1010     10/18/22 1100  ciprofloxacin (CIPRO) IVPB 400 mg        400 mg 200 mL/hr over 60 Minutes Intravenous Every 12 hours 10/18/22 1010         Assessment/Plan: s/p Procedure(s): ENDOSCOPIC RETROGRADE CHOLANGIOPANCREATOGRAPHY (ERCP)  (N/A) SPHINCTEROTOMY REMOVAL OF STONES  Lipase remains normal, LFTs continue to downtrend, her white count remains elevated at 21 but her exam and pain levels are much improved.  Will plan to proceed today with laparoscopic cholecystectomy.  I discussed the surgery with her including relevant anatomy, technique, risks, benefits and alternatives and her questions have been answered to her satisfaction.   LOS: 4 days    Clovis Riley 10/21/2022

## 2022-10-21 NOTE — Plan of Care (Signed)

## 2022-10-21 NOTE — Progress Notes (Addendum)
Patient safely transported back to bed for OR.Alert,oriented*4. Vitals stable.JP drain and surgical incision intact.Bed alarms on, call bell on reach.Family bedside.

## 2022-10-21 NOTE — Anesthesia Postprocedure Evaluation (Signed)
Anesthesia Post Note  Patient: Catherine [name]  Procedure(s) Performed: LAPAROSCOPIC CHOLECYSTECTOMY (Abdomen)     Patient location during evaluation: PACU Anesthesia Type: General Level of consciousness: awake and alert Pain management: pain level controlled Vital Signs Assessment: post-procedure vital signs reviewed and stable Respiratory status: spontaneous breathing, nonlabored ventilation, respiratory function stable and patient connected to nasal cannula oxygen Cardiovascular status: blood pressure returned to baseline and stable Postop Assessment: no apparent nausea or vomiting Anesthetic complications: no  No notable events documented.  Last Vitals:  Vitals:   10/21/22 1630 10/21/22 1639  BP: 120/77   Pulse: (!) 101 (!) 103  Resp: 20 20  Temp:    SpO2: 95% 90%    Last Pain:  Vitals:   10/21/22 1639  TempSrc:   PainSc: 6                  [name]

## 2022-10-21 NOTE — Plan of Care (Signed)

## 2022-10-21 NOTE — Transfer of Care (Signed)
Immediate Anesthesia Transfer of Care Note  Patient: BELOVED COBIA  Procedure(s) Performed: LAPAROSCOPIC CHOLECYSTECTOMY (Abdomen)  Patient Location: PACU  Anesthesia Type:General  Level of Consciousness: awake, alert , and oriented  Airway & Oxygen Therapy: Patient Spontanous Breathing and Patient connected to face mask oxygen  Post-op Assessment: Report given to RN and Post -op Vital signs reviewed and stable  Post vital signs: Reviewed and stable  Last Vitals:  Vitals Value Taken Time  BP 119/75 10/21/22 1618  Temp    Pulse 105 10/21/22 1621  Resp 15 10/21/22 1621  SpO2 94 % 10/21/22 1621  Vitals shown include unvalidated device data.  Last Pain:  Vitals:   10/21/22 1333  TempSrc:   PainSc: 3       Patients Stated Pain Goal: 1 ([id number])  Complications: No notable events documented.

## 2022-10-21 NOTE — Op Note (Signed)
Operative Note  CHANEQUA KASPRZYK 52 y.o. female [id number]  10/21/2022  Surgeon: Clovis Riley MD FACS  Assistant: Serita Grammes MD FACS  Procedure performed: Laparoscopic Cholecystectomy  Procedure classification: urgent  Preop diagnosis: gallstone pancreatitis Post-op diagnosis/intraop findings: severe acute on chronic cholecystitis  Specimens: gallbladder  Retained items: 12f round blake drain  EBL: [id number] Complications: none  Description of procedure: After obtaining informed consent the patient was brought to the operating room. Antibiotics were administered. SCD's were applied. General endotracheal anesthesia was initiated and a formal time-out was performed. The abdomen was prepped and draped in the usual sterile fashion and the abdomen was entered using visiport technique in the left upper quadrant after instilling the site with local. Insufflation to 1482mg was obtained and gross inspection revealed no evidence of injury from our entry or other intraabdominal abnormalities. Three 82m58mrocars were introduced in the supraumbilical, right midclavicular and right anterior axillary lines under direct visualization and following infiltration with local. An 4m22mocar was placed in the epigastrium.  The gallbladder was noted to be intensely inflamed with significant thickening and scarring along the gallbladder wall and hepatocystic triangle.  The tissues here were quite fibrotic.  The gallbladder fundus was able to be retracted cephalad and omental adhesions to the gallbladder body were taken down with cautery and blunt dissection, carefully protecting the nearby duodenum.  The infundibulum was retracted laterally. A combination of hook electrocautery and blunt dissection was utilized to clear the peritoneum from the neck and cystic duct, circumferentially isolating the cystic artery and cystic duct and lifting the gallbladder from the cystic plate.  This dissection was extremely  tedious; only thin transparent strands of tissue were divided with the cautery, and blunt dissection was used to develop the plane surrounding the cystic duct.  Ultimately, the critical view of safety was achieved with the cystic artery, cystic duct, and liver bed visualized between them with no other structures. The artery was clipped with a single clip proximally and distally and divided.  The cystic duct was fibrotic and thickened and was not amenable to clip placement.  I thus proceeded to dissect the gallbladder from the liver bed proceeding in a combination of dome down as well as proximal to distal dissection until the only structure connecting the gallbladder to the patient was the cystic duct.  In this process, the gallbladder did tear and spilled innumerable small and large stones.  The back wall of the gallbladder was in fact left on the liver bed.  This was ablated with cautery.  There was some bleeding noted from the right lateral edge of the gallbladder peritoneum/liver.  I attempted to clip this which was unsuccessful, ultimately this resolved with direct pressure for several minutes.  2 0 pds Endoloops were placed across the cystic duct which was then transected just distal to the.  Once freed the gallbladder was placed in an endocatch bag and removed through the epigastric trocar site.  I extracted as many stones as possible with a combination of the suction aspirator and stone grasper.  The right upper quadrant was irrigated and aspirated; the effluent was clear.  Hemostasis was once again confirmed, and reinspection of the abdomen revealed no injuries. The clips and Endoloops were well opposed without any bile leak from the duct or the liver bed.  A 19 French round Blake drain was inserted to the right lateral trocar site and placed along the liver bed.  This is secured to the skin with a  2-0 nylon.  The 72m trocar site in the epigastrium was closed with a 0 vicryl in the fascia under direct  visualization using a PMI device. The abdomen was desufflated and all trocars removed. The skin incisions were closed with running subcuticular monocryl and Dermabond. The patient was awakened, extubated and transported to the recovery room in stable condition.    All counts were correct at the completion of the case.

## 2022-10-21 NOTE — Progress Notes (Signed)
[facility] PROGRESS NOTE   Catherine [name] [id number] DOB: 07-05-1971 DOA: 10/17/2022  PCP: Aura Dials, MD  Brief History/Interval Summary:  52 y.o. female with past medical history of essential hypertension, previous history of alcoholism but none in the last 4 years who initially presented to the emergency department about a week ago with concern for chest pain.  During that evaluation she was found to have abnormal LFTs.  However it does not appear the patient was complaining of abdominal pain at that time.  She was asked to see her primary care provider.  She went to her primary care provider's office and had blood work done which showed elevated liver enzymes.  And then that night she started developing abdominal discomfort radiating to the right shoulder.  Found to have elevated lipase level.  She was admitted for further management of acute biliary pancreatitis.    Consultants: Gastroenterology.  General surgery  Procedures:   ERCP 2/18 Impression:               - The major papilla appeared to be slightly bulging.                           - Choledocholithiasis was found. Complete removal                            was accomplished by biliary sphincterotomy and                            balloon extraction.                           - A biliary sphincterotomy was performed.                           - The biliary tree was swept and nothing was found                            the end of the procedure.    Subjective/Interval History:  She reports good night sleep, reports abdominal pain significantly decreased.   Assessment/Plan:  Acute biliary pancreatitis/ileus Likely secondary to gallstones.  CT scan showed inflammation around the pancreas without any concerning features.  Patient underwent MRCP which confirmed pancreatitis and suggested cholelithiasis.  Showed dilated bile ducts. Patient was seen by gastroenterology. Patient underwent ERCP on 2/18  and was found to have choledocholithiasis.  She underwent biliary sphincterotomy. Lipase level has improved. -Pain much improved today, plan for laparoscopic cholecystectomy today.   Elevated WBCs noted.  Continue with ciprofloxacin and metronidazole.  Abnormal LFTs with cholelithiasis This is all secondary to choledocholithiasis.  Labs are improving after biliary sphincterotomy.  Bilirubin is down to 2.1.  Alkaline phosphatase is also improved.  Hepatitis panel is unremarkable.    Hyponatremia and hypokalemia Continue to monitor electrolytes.  Replace potassium.  Magnesium is 1.8.    Hypoglycemia Secondary to poor oral intake.  Will give D50.  Start D5 infusion.  Essential hypertension Holding her home medications.  She was on amlodipine, lisinopril HCTZ and metoprolol prior to admission. Blood pressure is reasonably well-controlled.  Mildly elevated beta-hCG She is status post abdominal hysterectomy with bilateral salpingo-oophorectomy.  The level is likely erroneous.  Abnormal EKG Initial EKG  suggested prolonged QT interval but it was a misread.  EKG from 2/18 does not show any QT prolongation.  Obesity Estimated body mass index is 38.99 kg/m as calculated from the following:   Height as of this encounter: 5' 9"$  (1.753 m).   Weight as of this encounter: 119.7 kg.  DVT Prophylaxis: Lovenox Code Status: Full code Family Communication: Discussed with the patient Disposition Plan: Hopefully return home when improved.  Start mobilizing.  Status is: Inpatient Remains inpatient appropriate because: Acute pancreatitis     Medications: Scheduled:  chlorhexidine       enoxaparin (LOVENOX) injection  60 mg Subcutaneous Q24H   Continuous:  ciprofloxacin 400 mg (10/21/22 0447)   dextrose 5 % and 0.9 % NaCl with KCl 20 mEq/L 100 mL/hr at 10/21/22 0921   metronidazole 500 mg (10/21/22 1101)   [id number] **OR** acetaminophen, albuterol, chlorhexidine, dextrose,  hydrALAZINE, HYDROmorphone (DILAUDID) injection, ondansetron **OR** ondansetron (ZOFRAN) IV  Antibiotics: Anti-infectives (From admission, onward)    Start     Dose/Rate Route Frequency Ordered Stop   10/18/22 1100  metroNIDAZOLE (FLAGYL) IVPB 500 mg        500 mg 100 mL/hr over 60 Minutes Intravenous Every 12 hours 10/18/22 1010     10/18/22 1100  ciprofloxacin (CIPRO) IVPB 400 mg        400 mg 200 mL/hr over 60 Minutes Intravenous Every 12 hours 10/18/22 1010         Objective:  Vital Signs  Vitals:   10/20/22 2347 10/21/22 0759 10/21/22 1155 10/21/22 1227  BP: (!) 132/94 (!) 137/99 (!) 148/94 127/89  Pulse: 97 100 (!) 106 97  Resp: 18 19 (!) 25   Temp: 98.5 F (36.9 C) 97.6 F (36.4 C) 98.4 F (36.9 C) 98.4 F (36.9 C)  TempSrc:  Oral Oral   SpO2: 98% 98% 95%   Weight:      Height:        Intake/Output Summary (Last 24 hours) at 10/21/2022 1322 Last data filed at 10/20/2022 1801 Gross per 24 hour  Intake 2099.21 ml  Output --  Net 2099.21 ml   Filed Weights   10/17/22 0912  Weight: 119.7 kg    Awake Alert, Oriented X 3, No new F.N deficits, Normal affect Symmetrical Chest wall movement, Good air movement bilaterally, CTAB RRR,No Gallops,Rubs or new Murmurs, No Parasternal Heave +ve B.Sounds, Abd Soft, No tenderness, No rebound - guarding or rigidity. No Cyanosis, Clubbing or edema, No new Rash or bruise      Lab Results:  Data Reviewed: I have personally reviewed following labs and reports of the imaging studies  CBC: Recent Labs  Lab 10/17/22 0928 10/18/22 0745 10/19/22 0412 10/20/22 0312 10/21/22 0420  WBC 17.3* 18.3* 21.7* 21.3* 21.0*  HGB 16.0* 12.7 11.4* 12.6 10.9*  HCT 44.7 36.9 32.0* 35.2* 31.7*  MCV 88.0 89.8 89.1 88.9 90.8  PLT 259 229 176 [id number]    Basic Metabolic Panel: Recent Labs  Lab 10/17/22 0928 10/18/22 0745 10/19/22 0412 10/20/22 0312 10/21/22 0420  NA 131* 133* 135 134* 133*  K 3.4* 2.9* 4.1 3.0* 3.0*  CL 95*  101 104 103 103  CO2 20* 19* 21* 20* 19*  GLUCOSE 117* 82 110* 68* 93  BUN 15 13 11 7 $ <5*  CREATININE 0.81 0.72 0.64 0.67 0.48  CALCIUM 9.1 7.9* 8.3* 8.5* 7.9*  MG  --  2.2  --  1.8  --     GFR: Estimated Creatinine Clearance: 115.1 mL/min (  by C-G formula based on SCr of 0.48 mg/dL).  Liver Function Tests: Recent Labs  Lab 10/17/22 0928 10/18/22 0745 10/19/22 0412 10/20/22 0312 10/21/22 0420  AST 285* 224* 91* 48* 23  ALT 613* 487* 311* 242* 131*  ALKPHOS 362* 365* 280* 257* 165*  BILITOT 6.0* 6.6* 2.2* 2.1* 1.4*  PROT 7.1 5.8* 5.6* 6.4* 5.4*  ALBUMIN 3.6 2.8* 2.5* 2.9* 2.2*    Recent Labs  Lab 10/17/22 0928 10/18/22 0745 10/19/22 0412 10/20/22 0312 10/21/22 0420  LIPASE 1,478* 294* 59* 33 24     Lipid Profile: No results for input(s): "CHOL", "HDL", "LDLCALC", "TRIG", "CHOLHDL", "LDLDIRECT" in the last 72 hours.   Radiology Studies: No results found.     LOS: 4 days   [facility]  [facility] Pager on [email or web address]  10/21/2022, 1:22 PM

## 2022-10-21 NOTE — Anesthesia Procedure Notes (Signed)
Procedure Name: Intubation Date/Time: 10/21/2022 2:08 PM  Performed by: Annamary Carolin, CRNAPre-anesthesia Checklist: Patient identified, Emergency Drugs available, Suction available and Patient being monitored Patient Re-evaluated:Patient Re-evaluated prior to induction Oxygen Delivery Method: Circle System Utilized Preoxygenation: Pre-oxygenation with 100% oxygen Induction Type: IV induction Ventilation: Mask ventilation without difficulty Laryngoscope Size: Mac and 3 Grade View: Grade II Tube type: Oral Number of attempts: 1 Airway Equipment and Method: Stylet Placement Confirmation: ETT inserted through vocal cords under direct vision, positive ETCO2 and breath sounds checked- equal and bilateral Secured at: 22 cm Tube secured with: Tape Dental Injury: Teeth and Oropharynx as per pre-operative assessment

## 2022-10-21 NOTE — Anesthesia Preprocedure Evaluation (Addendum)
Anesthesia Evaluation  Patient identified by MRN, date of birth, ID band Patient awake    Reviewed: Allergy & Precautions, NPO status , Patient's Chart, lab work & pertinent test results, reviewed documented beta blocker date and time   Airway Mallampati: II  TM Distance: >3 FB Neck ROM: Full    Dental  (+) Poor Dentition, Missing, Chipped, Loose,    Pulmonary Current Smoker and Patient abstained from smoking.   breath sounds clear to auscultation + decreased breath sounds      Cardiovascular hypertension, Pt. on medications and Pt. on home beta blockers  Rhythm:Regular Rate:Tachycardia     Neuro/Psych  Headaches PSYCHIATRIC DISORDERS Anxiety Depression Bipolar Disorder      GI/Hepatic Neg liver ROS,GERD  Medicated,,Gallstone pancreatitis Cholelithiiasis   Endo/Other    Morbid obesityHyperlipidemia   Renal/GU negative Renal ROS  negative genitourinary   Musculoskeletal negative musculoskeletal ROS (+)    Abdominal  (+) + obese Abdomen: tender.   Peds  Hematology  (+) Blood dyscrasia, anemia   Anesthesia Other Findings   Reproductive/Obstetrics                             Anesthesia Physical Anesthesia Plan  ASA: 2  Anesthesia Plan: General   Post-op Pain Management: Minimal or no pain anticipated   Induction: Intravenous, Cricoid pressure planned and Rapid sequence  PONV Risk Score and Plan: 2 and Ondansetron, Midazolam and Treatment may vary due to age or medical condition  Airway Management Planned: Oral ETT  Additional Equipment: None  Intra-op Plan:   Post-operative Plan: Extubation in OR  Informed Consent: I have reviewed the patients History and Physical, chart, labs and discussed the procedure including the risks, benefits and alternatives for the proposed anesthesia with the patient or authorized representative who has indicated his/her understanding and acceptance.      Dental advisory given  Plan Discussed with: CRNA and Surgeon  Anesthesia Plan Comments:        Anesthesia Quick Evaluation

## 2022-10-22 ENCOUNTER — Encounter (HOSPITAL_COMMUNITY): Payer: Self-pay | Admitting: Surgery

## 2022-10-22 ENCOUNTER — Other Ambulatory Visit: Payer: Self-pay

## 2022-10-22 LAB — GLUCOSE, CAPILLARY: Glucose-Capillary: 98 mg/dL (ref 70–99)

## 2022-10-22 LAB — COMPREHENSIVE METABOLIC PANEL
ALT: 109 U/L — ABNORMAL HIGH (ref 0–44)
AST: 47 U/L — ABNORMAL HIGH (ref 15–41)
Albumin: 2.1 g/dL — ABNORMAL LOW (ref 3.5–5.0)
Alkaline Phosphatase: 142 U/L — ABNORMAL HIGH (ref 38–126)
Anion gap: 9 (ref 5–15)
BUN: 6 mg/dL (ref 6–20)
CO2: 22 mmol/L (ref 22–32)
Calcium: 8.2 mg/dL — ABNORMAL LOW (ref 8.9–10.3)
Chloride: 105 mmol/L (ref 98–111)
Creatinine, Ser: 0.51 mg/dL (ref 0.44–1.00)
GFR, Estimated: >60 mL/min (ref >60)
Glucose, Bld: 111 mg/dL — ABNORMAL HIGH (ref 70–99)
Potassium: 3.3 mmol/L — ABNORMAL LOW (ref 3.5–5.1)
Sodium: 136 mmol/L (ref 135–145)
Total Bilirubin: 1.1 mg/dL (ref 0.3–1.2)
Total Protein: 5.3 g/dL — ABNORMAL LOW (ref 6.5–8.1)

## 2022-10-22 LAB — CBC
HCT: 32.2 % — ABNORMAL LOW (ref 36.0–46.0)
Hemoglobin: 10.9 g/dL — ABNORMAL LOW (ref 12.0–15.0)
MCH: 30.7 pg (ref 26.0–34.0)
MCHC: 33.9 g/dL (ref 30.0–36.0)
MCV: 90.7 fL (ref 80.0–100.0)
Platelets: 267 10*3/uL (ref 150–400)
RBC: 3.55 MIL/uL — ABNORMAL LOW (ref 3.87–5.11)
RDW: 14.8 % (ref 11.5–15.5)
WBC: 18.5 10*3/uL — ABNORMAL HIGH (ref 4.0–10.5)
nRBC: 0 % (ref 0.0–0.2)

## 2022-10-22 MED ORDER — DEXAMETHASONE SODIUM PHOSPHATE 10 MG/ML IJ SOLN
INTRAMUSCULAR | Status: AC
  Filled 2022-10-22: qty 2

## 2022-10-22 MED ORDER — POTASSIUM CHLORIDE CRYS ER 20 MEQ PO TBCR: 40.0000 meq | EXTENDED_RELEASE_TABLET | Freq: Four times a day (QID) | ORAL | Status: DC

## 2022-10-22 MED ORDER — METHOCARBAMOL 1000 MG/10ML IJ SOLN: 500.0000 mg | Freq: Four times a day (QID) | INTRAVENOUS | Status: DC | PRN

## 2022-10-22 MED ORDER — POTASSIUM CHLORIDE CRYS ER 20 MEQ PO TBCR
30.0000 meq | EXTENDED_RELEASE_TABLET | Freq: Four times a day (QID) | ORAL | Status: AC
  Administered 2022-10-22 (×2): 30 meq via ORAL
  Filled 2022-10-22 (×2): qty 1

## 2022-10-22 MED ORDER — ONDANSETRON HCL 4 MG/2ML IJ SOLN
INTRAMUSCULAR | Status: AC
  Filled 2022-10-22: qty 4

## 2022-10-22 MED ORDER — OXYCODONE HCL 5 MG PO TABS
5.0000 mg | ORAL_TABLET | ORAL | Status: DC | PRN
  Administered 2022-10-22 (×2): 5 mg via ORAL
  Filled 2022-10-22 (×2): qty 1

## 2022-10-22 NOTE — Progress Notes (Addendum)
  Transition of Care [facility]) Screening Note   Patient Details  Name: Catherine [name] Date of Birth: 1970/10/18   Transition of Care [facility]) CM/SW Contact:    Pollie Friar, RN Phone Number: 10/22/2022, 3:29 PM   Pt is from home. She is s/p cholecystectomy. Transition of Care Department [facility]) has reviewed patient. We will continue to monitor patient advancement through interdisciplinary progression rounds. If new patient transition needs arise, please place a TOC consult.

## 2022-10-22 NOTE — Progress Notes (Signed)
[facility] Progress Note  1 Day Post-Op  Subjective: CC:  Feels better compared to pre-op. Reports mild nausea after clears this am. Denies flatus or stool since admission.   Objective: Vital signs in last 24 hours: Temp:  [97.8 F (36.6 C)-98.4 F (36.9 C)] 97.8 F (36.6 C) (02/21 2357) Pulse Rate:  [61-107] 61 (02/22 0447) Resp:  [11-30] 11 (02/22 0447) BP: (119-148)/(75-94) 133/90 (02/22 0447) SpO2:  [90 %-98 %] 96 % (02/22 0447) Last BM Date : 10/15/22  Intake/Output from previous day: 02/21 0701 - 02/22 0700 In: 1000 [I.V.:1000] Out: 1970 [id number]; Drains:20] Intake/Output this shift: No intake/output data recorded.  PE: Gen:  Alert, NAD, pleasant Card:  Regular rate and rhythm, pedal pulses 2+ BL Pulm:  Normal effort, clear to auscultation bilaterally Abd: Soft, appropriately tender, mild distention, incisions c/d/I, drain with dark SS drainage ?bile-tinged vs brown from surgical snow  Skin: warm and dry, no rashes  Psych: A&Ox3   Lab Results:  Recent Labs    10/21/22 0420 10/22/22 0438  WBC 21.0* 18.5*  HGB 10.9* 10.9*  HCT 31.7* 32.2*  PLT 230 267   BMET Recent Labs    10/21/22 0420 10/22/22 0438  NA 133* 136  K 3.0* 3.3*  CL 103 105  CO2 19* 22  GLUCOSE 93 111*  BUN <5* 6  CREATININE 0.48 0.51  CALCIUM 7.9* 8.2*   PT/INR No results for input(s): "LABPROT", "INR" in the last 72 hours. CMP     Component Value Date/Time   NA 136 10/22/2022 0438   NA 141 08/17/2018 0000   K 3.3 (L) 10/22/2022 0438   CL 105 10/22/2022 0438   CO2 22 10/22/2022 0438   GLUCOSE 111 (H) 10/22/2022 0438   BUN 6 10/22/2022 0438   BUN 11 08/17/2018 0000   CREATININE 0.51 10/22/2022 0438   CALCIUM 8.2 (L) 10/22/2022 0438   PROT 5.3 (L) 10/22/2022 0438   ALBUMIN 2.1 (L) 10/22/2022 0438   AST 47 (H) 10/22/2022 0438   ALT 109 (H) 10/22/2022 0438   ALKPHOS 142 (H) 10/22/2022 0438   BILITOT 1.1 10/22/2022 0438   GFRNONAA >60 10/22/2022 0438    GFRAA >60 11/28/2015 1227   Lipase     Component Value Date/Time   LIPASE 24 10/21/2022 0420       Studies/Results: No results found.  Anti-infectives: Anti-infectives (From admission, onward)    Start     Dose/Rate Route Frequency Ordered Stop   10/18/22 1100  metroNIDAZOLE (FLAGYL) IVPB 500 mg        500 mg 100 mL/hr over 60 Minutes Intravenous Every 12 hours 10/18/22 1010     10/18/22 1100  ciprofloxacin (CIPRO) IVPB 400 mg        400 mg 200 mL/hr over 60 Minutes Intravenous Every 12 hours 10/18/22 1010          Assessment/Plan Gallstone pancreatitis Chronic cholecystitis POD#1 s/p laparoscopic cholecystectomy, drain placement 2/21 Dr. Kae Heller - AFVSS, WBC and LFTs improving - continue CLD - monitor drain     LOS: 5 days   I reviewed nursing notes, hospitalist notes, last 24 h vitals and pain scores, last 48 h intake and output, last 24 h labs and trends, and last 24 h imaging results.   Obie Dredge, PA-C [facility] Please see Amion for pager number during day hours 7:00am-4:30pm

## 2022-10-22 NOTE — Plan of Care (Signed)

## 2022-10-22 NOTE — Progress Notes (Addendum)
[facility] PROGRESS NOTE   Catherine [name] [id number] DOB: 26-Jul-1971 DOA: 10/17/2022  PCP: Aura Dials, MD  Brief History/Interval Summary:  52 y.o. female with past medical history of essential hypertension, previous history of alcoholism but none in the last 4 years who initially presented to the emergency department about a week ago with concern for chest pain.  During that evaluation she was found to have abnormal LFTs.  However it does not appear the patient was complaining of abdominal pain at that time.  She was asked to see her primary care provider.  She went to her primary care provider's office and had blood work done which showed elevated liver enzymes.  And then that night she started developing abdominal discomfort radiating to the right shoulder.  Found to have elevated lipase level.  She was admitted for further management of acute biliary pancreatitis.    Consultants: Gastroenterology.  General surgery  Procedures:   ERCP 2/18 Impression:               - The major papilla appeared to be slightly bulging.                           - Choledocholithiasis was found. Complete removal                            was accomplished by biliary sphincterotomy and                            balloon extraction.                           - A biliary sphincterotomy was performed.                           - The biliary tree was swept and nothing was found                            the end of the procedure.    Subjective/Interval History:  Clear liquid diet, but no bowel movement yet, abdominal pain is controlled   Assessment/Plan:  Acute biliary pancreatitis/ileus Likely secondary to gallstones.  CT scan showed inflammation around the pancreas without any concerning features.  Patient underwent MRCP which confirmed pancreatitis and suggested cholelithiasis.  Showed dilated bile ducts. Patient was seen by gastroenterology. Patient underwent ERCP on 2/18 and  was found to have choledocholithiasis.  She underwent biliary sphincterotomy. Lipase level has improved. -Status post laparoscopic cholecystectomy 2/21, further management per general surgery, tolerating clear liquid diet, abdomen is soft, drain output is thin, dark sanguinous, she did receive surgical snow in the liver bed. -Leukocytosis trending down, but still elevated, continue with ciprofloxacin and metronidazole   Abnormal LFTs with cholelithiasis This is all secondary to choledocholithiasis.  Labs are improving after biliary sphincterotomy.  Bilirubin is down to 2.1.  Alkaline phosphatase is also improved.  Hepatitis panel is unremarkable.    Hyponatremia and hypokalemia Continue to monitor electrolytes.  Replace potassium.  Magnesium is 1.8.    Hypoglycemia Secondary to poor oral intake.  Will give D50.  Start D5 infusion.  Essential hypertension Holding her home medications.  She was on amlodipine, lisinopril HCTZ and metoprolol prior to admission. Blood pressure is  reasonably well-controlled.  Mildly elevated beta-hCG She is status post abdominal hysterectomy with bilateral salpingo-oophorectomy.  The level is likely erroneous.  Abnormal EKG Initial EKG suggested prolonged QT interval but it was a misread.  EKG from 2/18 does not show any QT prolongation.  Obesity Estimated body mass index is 38.99 kg/m as calculated from the following:   Height as of this encounter: 5' 9"$  (1.753 m).   Weight as of this encounter: 119.7 kg.  DVT Prophylaxis: Lovenox Code Status: Full code Family Communication: Discussed with the patient Disposition Plan: Hopefully return home when improved.  Start mobilizing.  Status is: Inpatient Remains inpatient appropriate because: Acute pancreatitis     Medications: Scheduled:  enoxaparin (LOVENOX) injection  60 mg Subcutaneous Q24H   Continuous:  ciprofloxacin 400 mg (10/22/22 0450)   dextrose 5 % and 0.9 % NaCl with KCl 20 mEq/L 100  mL/hr at 10/21/22 1744   methocarbamol (ROBAXIN) IV     metronidazole 500 mg (10/22/22 1051)   [id number] **OR** acetaminophen, albuterol, dextrose, hydrALAZINE, HYDROmorphone (DILAUDID) injection, methocarbamol (ROBAXIN) IV, ondansetron **OR** ondansetron (ZOFRAN) IV, oxyCODONE  Antibiotics: Anti-infectives (From admission, onward)    Start     Dose/Rate Route Frequency Ordered Stop   10/18/22 1100  metroNIDAZOLE (FLAGYL) IVPB 500 mg        500 mg 100 mL/hr over 60 Minutes Intravenous Every 12 hours 10/18/22 1010     10/18/22 1100  ciprofloxacin (CIPRO) IVPB 400 mg        400 mg 200 mL/hr over 60 Minutes Intravenous Every 12 hours 10/18/22 1010         Objective:  Vital Signs  Vitals:   10/21/22 2356 10/21/22 2357 10/22/22 0447 10/22/22 1313  BP: (!) 125/94 (!) 125/94 (!) 133/90 127/83  Pulse: 85 85 61 96  Resp: (!) 30 20 11 $ (!) 21  Temp:  97.8 F (36.6 C)  98.5 F (36.9 C)  TempSrc: Oral  Oral Oral  SpO2: 94%  96% 99%  Weight:      Height:        Intake/Output Summary (Last 24 hours) at 10/22/2022 1509 Last data filed at 10/21/2022 2357 Gross per 24 hour  Intake 1000 ml  Output 1970 ml  Net -970 ml   Filed Weights   10/17/22 0912  Weight: 119.7 kg    Awake Alert, Oriented X 3, No new F.N deficits, Normal affect Symmetrical Chest wall movement, Good air movement bilaterally, CTAB RRR,No Gallops,Rubs or new Murmurs, No Parasternal Heave +ve B.Sounds, Abd Soft, incision C/D/I, drain with dark sanguinous output No Cyanosis, Clubbing or edema, No new Rash or bruise       Lab Results:  Data Reviewed: I have personally reviewed following labs and reports of the imaging studies  CBC: Recent Labs  Lab 10/18/22 0745 10/19/22 0412 10/20/22 0312 10/21/22 0420 10/22/22 0438  WBC 18.3* 21.7* 21.3* 21.0* 18.5*  HGB 12.7 11.4* 12.6 10.9* 10.9*  HCT 36.9 32.0* 35.2* 31.7* 32.2*  MCV 89.8 89.1 88.9 90.8 90.7  PLT 229 176 216 230 267    Basic  Metabolic Panel: Recent Labs  Lab 10/18/22 0745 10/19/22 0412 10/20/22 0312 10/21/22 0420 10/22/22 0438  NA 133* 135 134* 133* 136  K 2.9* 4.1 3.0* 3.0* 3.3*  CL 101 104 103 103 105  CO2 19* 21* 20* 19* 22  GLUCOSE 82 110* 68* 93 111*  BUN 13 11 7 $ <5* 6  CREATININE 0.72 0.64 0.67 0.48 0.51  CALCIUM 7.9* 8.3* 8.5*  7.9* 8.2*  MG 2.2  --  1.8  --   --     GFR: Estimated Creatinine Clearance: 115.1 mL/min (by C-G formula based on SCr of 0.51 mg/dL).  Liver Function Tests: Recent Labs  Lab 10/18/22 0745 10/19/22 0412 10/20/22 0312 10/21/22 0420 10/22/22 0438  AST 224* 91* 48* 23 47*  ALT 487* 311* 242* 131* 109*  ALKPHOS 365* 280* 257* 165* 142*  BILITOT 6.6* 2.2* 2.1* 1.4* 1.1  PROT 5.8* 5.6* 6.4* 5.4* 5.3*  ALBUMIN 2.8* 2.5* 2.9* 2.2* 2.1*    Recent Labs  Lab 10/17/22 0928 10/18/22 0745 10/19/22 0412 10/20/22 0312 10/21/22 0420  LIPASE 1,478* 294* 59* 33 24     Lipid Profile: No results for input(s): "CHOL", "HDL", "LDLCALC", "TRIG", "CHOLHDL", "LDLDIRECT" in the last 72 hours.   Radiology Studies: No results found.     LOS: 5 days   [facility]  [facility] Pager on [email or web address]  10/22/2022, 3:09 PM

## 2022-10-23 DIAGNOSIS — K8065 Calculus of gallbladder and bile duct with chronic cholecystitis with obstruction: Secondary | ICD-10-CM

## 2022-10-23 LAB — COMPREHENSIVE METABOLIC PANEL
ALT: 73 U/L — ABNORMAL HIGH (ref 0–44)
AST: 29 U/L (ref 15–41)
Albumin: 2 g/dL — ABNORMAL LOW (ref 3.5–5.0)
Alkaline Phosphatase: 126 U/L (ref 38–126)
Anion gap: 6 (ref 5–15)
BUN: 6 mg/dL (ref 6–20)
CO2: 24 mmol/L (ref 22–32)
Calcium: 7.6 mg/dL — ABNORMAL LOW (ref 8.9–10.3)
Chloride: 105 mmol/L (ref 98–111)
Creatinine, Ser: 0.48 mg/dL (ref 0.44–1.00)
GFR, Estimated: >60 mL/min (ref >60)
Glucose, Bld: 88 mg/dL (ref 70–99)
Potassium: 2.8 mmol/L — ABNORMAL LOW (ref 3.5–5.1)
Sodium: 135 mmol/L (ref 135–145)
Total Bilirubin: 1 mg/dL (ref 0.3–1.2)
Total Protein: 5.1 g/dL — ABNORMAL LOW (ref 6.5–8.1)

## 2022-10-23 LAB — CBC
HCT: 29.5 % — ABNORMAL LOW (ref 36.0–46.0)
Hemoglobin: 10.4 g/dL — ABNORMAL LOW (ref 12.0–15.0)
MCH: 31 pg (ref 26.0–34.0)
MCHC: 35.3 g/dL (ref 30.0–36.0)
MCV: 88.1 fL (ref 80.0–100.0)
Platelets: 278 10*3/uL (ref 150–400)
RBC: 3.35 MIL/uL — ABNORMAL LOW (ref 3.87–5.11)
RDW: 14.9 % (ref 11.5–15.5)
WBC: 17.8 10*3/uL — ABNORMAL HIGH (ref 4.0–10.5)
nRBC: 0 % (ref 0.0–0.2)

## 2022-10-23 LAB — MAGNESIUM: Magnesium: 1.8 mg/dL (ref 1.7–2.4)

## 2022-10-23 LAB — SURGICAL PATHOLOGY

## 2022-10-23 LAB — GLUCOSE, CAPILLARY
Glucose-Capillary: 76 mg/dL (ref 70–99)
Glucose-Capillary: 114 mg/dL — ABNORMAL HIGH (ref 70–99)

## 2022-10-23 MED ORDER — MAGNESIUM OXIDE -MG SUPPLEMENT 400 (240 MG) MG PO TABS
800.0000 mg | ORAL_TABLET | Freq: Once | ORAL | Status: AC
  Administered 2022-10-23: 800 mg via ORAL
  Filled 2022-10-23: qty 2

## 2022-10-23 MED ORDER — POTASSIUM CHLORIDE 10 MEQ/100ML IV SOLN
10.0000 meq | INTRAVENOUS | Status: DC
  Administered 2022-10-23: 10 meq via INTRAVENOUS
  Filled 2022-10-23: qty 100

## 2022-10-23 MED ORDER — POTASSIUM CHLORIDE 20 MEQ PO PACK
60.0000 meq | PACK | Freq: Two times a day (BID) | ORAL | Status: AC
  Administered 2022-10-23 (×2): 60 meq via ORAL
  Filled 2022-10-23 (×2): qty 3

## 2022-10-23 MED ORDER — POLYETHYLENE GLYCOL 3350 17 G PO PACK
17.0000 g | PACK | Freq: Every day | ORAL | Status: DC
  Administered 2022-10-23 – 2022-10-25 (×3): 17 g via ORAL
  Filled 2022-10-23 (×3): qty 1

## 2022-10-23 MED ORDER — ONDANSETRON HCL 4 MG/2ML IJ SOLN
4.0000 mg | Freq: Once | INTRAMUSCULAR | Status: AC
  Administered 2022-10-23: 4 mg via INTRAVENOUS
  Filled 2022-10-23: qty 2

## 2022-10-23 MED ORDER — BOOST / RESOURCE BREEZE PO LIQD CUSTOM
1.0000 | Freq: Three times a day (TID) | ORAL | Status: DC
  Administered 2022-10-23 – 2022-10-25 (×5): 1 via ORAL

## 2022-10-23 MED ORDER — HYDROMORPHONE HCL 1 MG/ML IJ SOLN: 0.5000 mg | INTRAMUSCULAR | Status: DC | PRN

## 2022-10-23 MED ORDER — METOPROLOL SUCCINATE ER 25 MG PO TB24
12.5000 mg | ORAL_TABLET | Freq: Every day | ORAL | Status: DC
  Administered 2022-10-23 – 2022-10-25 (×3): 12.5 mg via ORAL
  Filled 2022-10-23 (×3): qty 1

## 2022-10-23 MED ORDER — BISACODYL 10 MG RE SUPP: 10.0000 mg | Freq: Every day | RECTAL | Status: DC | PRN

## 2022-10-23 MED ORDER — AMLODIPINE BESYLATE 5 MG PO TABS
2.5000 mg | ORAL_TABLET | Freq: Every day | ORAL | Status: DC
  Administered 2022-10-23 – 2022-10-25 (×3): 2.5 mg via ORAL
  Filled 2022-10-23 (×3): qty 1

## 2022-10-23 MED ORDER — OXYCODONE HCL 5 MG PO TABS: 5.0000 mg | ORAL_TABLET | Freq: Four times a day (QID) | ORAL | Status: DC | PRN

## 2022-10-23 MED ORDER — DOCUSATE SODIUM 100 MG PO CAPS
100.0000 mg | ORAL_CAPSULE | Freq: Two times a day (BID) | ORAL | Status: DC
  Administered 2022-10-23 – 2022-10-25 (×4): 100 mg via ORAL
  Filled 2022-10-23 (×4): qty 1

## 2022-10-23 NOTE — Progress Notes (Signed)
[facility] PROGRESS NOTE   Catherine [name] [id number] DOB: [birth year] DOA: 10/17/2022  PCP: Aura Dials, MD  Brief History/Interval Summary:  52 y.o. female with past medical history of essential hypertension, previous history of alcoholism but none in the last 4 years who initially presented to the emergency department about a week ago with concern for chest pain.  During that evaluation she was found to have abnormal LFTs.  However it does not appear the patient was complaining of abdominal pain at that time.  She was asked to see her primary care provider.  She went to her primary care provider's office and had blood work done which showed elevated liver enzymes.  And then that night she started developing abdominal discomfort radiating to the right shoulder.  Found to have elevated lipase level.  She was admitted for further management of acute biliary pancreatitis.    Consultants: Gastroenterology.  General surgery  Procedures:   ERCP 2/18 Impression:               - The major papilla appeared to be slightly bulging.                           - Choledocholithiasis was found. Complete removal                            was accomplished by biliary sphincterotomy and                            balloon extraction.                           - A biliary sphincterotomy was performed.                           - The biliary tree was swept and nothing was found                            the end of the procedure.    Subjective/Interval History:  Reports she is feeling better today, no nausea, no vomiting earlier today, but this afternoon she had some nausea, reports passing flatus overnight.   Assessment/Plan:  Acute biliary pancreatitis/ileus Likely secondary to gallstones.  CT scan showed inflammation around the pancreas without any concerning features.  Patient underwent MRCP which confirmed pancreatitis and suggested cholelithiasis.  Showed dilated bile  ducts. Patient was seen by gastroenterology. Patient underwent ERCP on 2/18 and was found to have choledocholithiasis.  She underwent biliary sphincterotomy. Lipase level has improved. -Status post laparoscopic cholecystectomy 2/21, further management per general surgery, tolerating clear liquid diet, abdomen is soft, drain output is thin, dark sanguinous, she did receive surgical snow in the liver bed. -Leukocytosis trending down, but still elevated, continue with ciprofloxacin and metronidazole   Abnormal LFTs with cholelithiasis This is all secondary to choledocholithiasis.  Labs are improving after biliary sphincterotomy.  Bilirubin is down to 2.1.  Alkaline phosphatase is also improved.  Hepatitis panel is unremarkable.    Hyponatremia and hypokalemia Difficultly low with potassium at 2.8, repleted  Hypoglycemia Secondary to poor oral intake.  Will give D50.  Start D5 infusion.  Essential hypertension -Blood pressure started to increase, will resume amlodipine and Toprol-XL (lower dose), continue hold  lisinopril and hydrochlorothiazide.  Mildly elevated beta-hCG She is status post abdominal hysterectomy with bilateral salpingo-oophorectomy.  The level is likely erroneous.  Abnormal EKG Initial EKG suggested prolonged QT interval but it was a misread.  EKG from 2/18 does not show any QT prolongation.  Obesity Estimated body mass index is 38.99 kg/m as calculated from the following:   Height as of this encounter: '5\' 9"'$  (1.753 m).   Weight as of this encounter: 119.7 kg.  DVT Prophylaxis: Lovenox Code Status: Full code Family Communication: Discussed with the patient Disposition Plan: Hopefully return home when improved.  Start mobilizing.  Status is: Inpatient Remains inpatient appropriate because: Acute pancreatitis     Medications: Scheduled:  enoxaparin (LOVENOX) injection  60 mg Subcutaneous Q24H   potassium chloride  60 mEq Oral BID   Continuous:  ciprofloxacin  400 mg (10/23/22 0445)   dextrose 5 % and 0.9 % NaCl with KCl 20 mEq/L 100 mL/hr at 10/21/22 1744   methocarbamol (ROBAXIN) IV     metronidazole 500 mg (10/23/22 1053)   potassium chloride     [id number] **OR** acetaminophen, albuterol, dextrose, hydrALAZINE, HYDROmorphone (DILAUDID) injection, methocarbamol (ROBAXIN) IV, ondansetron **OR** ondansetron (ZOFRAN) IV, oxyCODONE  Antibiotics: Anti-infectives (From admission, onward)    Start     Dose/Rate Route Frequency Ordered Stop   10/18/22 1100  metroNIDAZOLE (FLAGYL) IVPB 500 mg        500 mg 100 mL/hr over 60 Minutes Intravenous Every 12 hours 10/18/22 1010     10/18/22 1100  ciprofloxacin (CIPRO) IVPB 400 mg        400 mg 200 mL/hr over 60 Minutes Intravenous Every 12 hours 10/18/22 1010         Objective:  Vital Signs  Vitals:   10/22/22 2045 10/22/22 2335 10/23/22 0804 10/23/22 1034  BP: 136/88 (!) 124/93 (!) 143/94 (!) 131/90  Pulse: (!) 105 99 77 85  Resp: 20 20 (!) 30 18  Temp: 98.3 F (36.8 C)  (!) 97.4 F (36.3 C) 97.9 F (36.6 C)  TempSrc: Oral  Oral Oral  SpO2: 94% 93% 100% 100%  Weight:      Height:        Intake/Output Summary (Last 24 hours) at 10/23/2022 1410 Last data filed at 10/23/2022 1219 Gross per 24 hour  Intake 720 ml  Output 2540 ml  Net -1820 ml   Filed Weights   10/17/22 0912  Weight: 119.7 kg    Awake Alert, Oriented X 3, No new F.N deficits, Normal affect Symmetrical Chest wall movement, Good air movement bilaterally, CTAB RRR,No Gallops,Rubs or new Murmurs, No Parasternal Heave +ve B.Sounds, Abd Soft, incision C/D/I, drain present No Cyanosis, Clubbing or edema, No new Rash or bruise       Lab Results:  Data Reviewed: I have personally reviewed following labs and reports of the imaging studies  CBC: Recent Labs  Lab 10/19/22 0412 10/20/22 0312 10/21/22 0420 10/22/22 0438 10/23/22 0655  WBC 21.7* 21.3* 21.0* 18.5* 17.8*  HGB 11.4* 12.6 10.9* 10.9* 10.4*   HCT 32.0* 35.2* 31.7* 32.2* 29.5*  MCV 89.1 88.9 90.8 90.7 88.1  PLT 176 216 230 267 0000000    Basic Metabolic Panel: Recent Labs  Lab 10/18/22 0745 10/19/22 0412 10/20/22 0312 10/21/22 0420 10/22/22 0438 10/23/22 0655  NA 133* 135 134* 133* 136 135  K 2.9* 4.1 3.0* 3.0* 3.3* 2.8*  CL 101 104 103 103 105 105  CO2 19* 21* 20* 19* 22 24  GLUCOSE 82  110* 68* 93 111* 88  BUN '13 11 7 '$ <5* 6 6  CREATININE 0.72 0.64 0.67 0.48 0.51 0.48  CALCIUM 7.9* 8.3* 8.5* 7.9* 8.2* 7.6*  MG 2.2  --  1.8  --   --  1.8    GFR: Estimated Creatinine Clearance: 115.1 mL/min (by C-G formula based on SCr of 0.48 mg/dL).  Liver Function Tests: Recent Labs  Lab 10/19/22 0412 10/20/22 0312 10/21/22 0420 10/22/22 0438 10/23/22 0655  AST 91* 48* 23 47* 29  ALT 311* 242* 131* 109* 73*  ALKPHOS 280* 257* 165* 142* 126  BILITOT 2.2* 2.1* 1.4* 1.1 1.0  PROT 5.6* 6.4* 5.4* 5.3* 5.1*  ALBUMIN 2.5* 2.9* 2.2* 2.1* 2.0*    Recent Labs  Lab 10/17/22 0928 10/18/22 0745 10/19/22 0412 10/20/22 0312 10/21/22 0420  LIPASE 1,478* 294* 59* 33 24     Lipid Profile: No results for input(s): "CHOL", "HDL", "LDLCALC", "TRIG", "CHOLHDL", "LDLDIRECT" in the last 72 hours.   Radiology Studies: No results found.     LOS: 6 days   [facility]  [facility] Pager on [email or web address]  10/23/2022, 2:10 PM

## 2022-10-23 NOTE — Progress Notes (Signed)
[facility] Progress Note  2 Days Post-Op  Subjective: CC:  Feels better- reports her abd is sore. States she has not been out of bed. Denies nausea or vomiting. Reports early satiety and mild discomfort after grits yesterday. +flatus overnight. States her surgical drain has not been emptied since surgery  Objective: Vital signs in last 24 hours: Temp:  [97.4 F (36.3 C)-98.5 F (36.9 C)] 97.4 F (36.3 C) (02/23 0804) Pulse Rate:  [77-105] 77 (02/23 0804) Resp:  [20-30] 30 (02/23 0804) BP: (124-148)/(83-94) 143/94 (02/23 0804) SpO2:  [93 %-100 %] 100 % (02/23 0804) Last BM Date : 10/15/22  Intake/Output from previous day: 02/22 0701 - 02/23 0700 In: 240 [P.O.:240] Out: 2200 [Urine:2200] Intake/Output this shift: Total I/O In: 240 [P.O.:240] Out: 1440 [Urine:1400; Drains:40]  PE: Gen:  Alert, NAD, pleasant Card:  Regular rate and rhythm Pulm:  Normal effort ORA Abd: Soft, appropriately tender, mild distention, incisions c/d/i, drain with dark SS drainage ?bile-tinged vs brown from surgical snow  Skin: warm and dry, no rashes  Psych: A&Ox3   Lab Results:  Recent Labs    10/22/22 0438 10/23/22 0655  WBC 18.5* 17.8*  HGB 10.9* 10.4*  HCT 32.2* 29.5*  PLT 267 278   BMET Recent Labs    10/22/22 0438 10/23/22 0655  NA 136 135  K 3.3* 2.8*  CL 105 105  CO2 22 24  GLUCOSE 111* 88  BUN 6 6  CREATININE 0.51 0.48  CALCIUM 8.2* 7.6*   PT/INR No results for input(s): "LABPROT", "INR" in the last 72 hours. CMP     Component Value Date/Time   NA 135 10/23/2022 0655   NA 141 08/17/2018 0000   K 2.8 (L) 10/23/2022 0655   CL 105 10/23/2022 0655   CO2 24 10/23/2022 0655   GLUCOSE 88 10/23/2022 0655   BUN 6 10/23/2022 0655   BUN 11 08/17/2018 0000   CREATININE 0.48 10/23/2022 0655   CALCIUM 7.6 (L) 10/23/2022 0655   PROT 5.1 (L) 10/23/2022 0655   ALBUMIN 2.0 (L) 10/23/2022 0655   AST 29 10/23/2022 0655   ALT 73 (H) 10/23/2022 0655   ALKPHOS 126  10/23/2022 0655   BILITOT 1.0 10/23/2022 0655   GFRNONAA >60 10/23/2022 0655   GFRAA >60 11/28/2015 1227   Lipase     Component Value Date/Time   LIPASE 24 10/21/2022 0420       Studies/Results: No results found.  Anti-infectives: Anti-infectives (From admission, onward)    Start     Dose/Rate Route Frequency Ordered Stop   10/18/22 1100  metroNIDAZOLE (FLAGYL) IVPB 500 mg        500 mg 100 mL/hr over 60 Minutes Intravenous Every 12 hours 10/18/22 1010     10/18/22 1100  ciprofloxacin (CIPRO) IVPB 400 mg        400 mg 200 mL/hr over 60 Minutes Intravenous Every 12 hours 10/18/22 1010          Assessment/Plan Gallstone pancreatitis Chronic cholecystitis POD#2 s/p laparoscopic cholecystectomy, drain placement 2/21 Dr. Kae Heller - AFVSS, WBC and LFTs improving - advance to Soft diet and await further bowel function - monitor drain  - K 2.8, Mg 1.8 - replete     LOS: 6 days   I reviewed nursing notes, hospitalist notes, last 24 h vitals and pain scores, last 48 h intake and output, last 24 h labs and trends, and last 24 h imaging results.   Obie Dredge, [facility] Please see  Amion for pager number during day hours 7:00am-4:30pm

## 2022-10-23 NOTE — Plan of Care (Signed)

## 2022-10-24 LAB — CBC
HCT: 31.7 % — ABNORMAL LOW (ref 36.0–46.0)
Hemoglobin: 10.9 g/dL — ABNORMAL LOW (ref 12.0–15.0)
MCH: 31.1 pg (ref 26.0–34.0)
MCHC: 34.4 g/dL (ref 30.0–36.0)
MCV: 90.3 fL (ref 80.0–100.0)
Platelets: 297 10*3/uL (ref 150–400)
RBC: 3.51 MIL/uL — ABNORMAL LOW (ref 3.87–5.11)
RDW: 14.8 % (ref 11.5–15.5)
WBC: 19.7 10*3/uL — ABNORMAL HIGH (ref 4.0–10.5)
nRBC: 0.2 % (ref 0.0–0.2)

## 2022-10-24 LAB — BASIC METABOLIC PANEL
Anion gap: 10 (ref 5–15)
BUN: 5 mg/dL — ABNORMAL LOW (ref 6–20)
CO2: 23 mmol/L (ref 22–32)
Calcium: 8.2 mg/dL — ABNORMAL LOW (ref 8.9–10.3)
Chloride: 104 mmol/L (ref 98–111)
Creatinine, Ser: 0.55 mg/dL (ref 0.44–1.00)
GFR, Estimated: >60 mL/min (ref >60)
Glucose, Bld: 93 mg/dL (ref 70–99)
Potassium: 3.8 mmol/L (ref 3.5–5.1)
Sodium: 137 mmol/L (ref 135–145)

## 2022-10-24 LAB — GLUCOSE, CAPILLARY: Glucose-Capillary: 99 mg/dL (ref 70–99)

## 2022-10-24 LAB — PHOSPHORUS: Phosphorus: 3.1 mg/dL (ref 2.5–4.6)

## 2022-10-24 MED ORDER — ACETAMINOPHEN 325 MG PO TABS
325.0000 mg | ORAL_TABLET | Freq: Four times a day (QID) | ORAL | Status: DC | PRN
  Administered 2022-10-24: 325 mg via ORAL
  Filled 2022-10-24: qty 1

## 2022-10-24 MED ORDER — BUTALBITAL-APAP-CAFFEINE 50-325-40 MG PO TABS: 1.0000 | ORAL_TABLET | Freq: Four times a day (QID) | ORAL | Status: DC | PRN

## 2022-10-24 MED ORDER — ACETAMINOPHEN 650 MG RE SUPP: 325.0000 mg | Freq: Four times a day (QID) | RECTAL | Status: DC | PRN

## 2022-10-24 NOTE — Evaluation (Signed)
Physical Therapy Evaluation Patient Details Name: Catherine [name] MRN: [id number] DOB: 06/19/1971 Today's Date: 10/24/2022  History of Present Illness  Patient is a 52 y/o female who presents on 2/17 with abdominal pain. Found to have acute biliary pancreatitis likely secondary to gallstones now s/p ERCP 2/18 and lap cholecystectomy 2/21. PMH includes bipolar, anxiety, HTN, depression.  Clinical Impression  Patient presents with pain, generalized weakness/deconditioning, decreased cardiovascular endurance, dyspnea on exertion and impaired mobility s/p above. Pt lives at home with her sister, b/f and 2 dogs and reports using SPC for ambulation PTA due to chronic back pain. Today, pt tolerated transfers and ambulation with supervision for safety and to manage lines. Required 2 standing rest breaks due to 3/4 DOE and fatigue. HR ranging from 90-121 bpm. Sp02 remained in 90s on RA. Encouraged walking 2 more times today with sister/mobility tech/nurse as able. Will follow acutely to maximize independence and mobility prior to return home.       Recommendations for follow up therapy are one component of a multi-disciplinary discharge planning process, led by the attending physician.  Recommendations may be updated based on patient status, additional functional criteria and insurance authorization.  Follow Up Recommendations No PT follow up      Assistance Recommended at Discharge PRN  Patient can return home with the following  A little help with walking and/or transfers;A little help with bathing/dressing/bathroom;Assist for transportation;Assistance with cooking/housework    Equipment Recommendations None recommended by PT  Recommendations for Other Services       Functional Status Assessment Patient has had a recent decline in their functional status and demonstrates the ability to make significant improvements in function in a reasonable and predictable amount of time.     Precautions /  Restrictions Precautions Precautions: Other (comment) Precaution Comments: watch HR, drain Restrictions Weight Bearing Restrictions: No      Mobility  Bed Mobility Overal bed mobility: Needs Assistance Bed Mobility: Supine to Sit, Sit to Supine     Supine to sit: Modified independent (Device/Increase time), HOB elevated Sit to supine: Modified independent (Device/Increase time), HOB elevated   General bed mobility comments: No assist needed, increased time    Transfers Overall transfer level: Needs assistance Equipment used: None Transfers: Sit to/from Stand Sit to Stand: Supervision           General transfer comment: Supervision for safety. Stood from EOB x1, from elevated BSC over toilet x1.    Ambulation/Gait Ambulation/Gait assistance: Supervision Gait Distance (Feet): 200 Feet Assistive device: None Gait Pattern/deviations: Step-through pattern, Decreased stride length Gait velocity: decreased Gait velocity interpretation: 1.31 - 2.62 ft/sec, indicative of limited community ambulator   General Gait Details: SLow, mostly steady gait reaching for rail or counter PRN, 2 standing rest breaks due to 3/4 DOE. HR ranging from 90-121 bpm. Sp02 remained in 90s on RA.  Stairs            Wheelchair Mobility    Modified Rankin (Stroke Patients Only)       Balance Overall balance assessment: Needs assistance Sitting-balance support: Feet supported, No upper extremity supported Sitting balance-Leahy Scale: Good Sitting balance - Comments: Able to perform pericare without difficulty   Standing balance support: During functional activity Standing balance-Leahy Scale: Fair Standing balance comment: Able to wash hands at sink without assist                             Pertinent Vitals/Pain Pain  Assessment Pain Assessment: 0-10 Pain Score: 5  Pain Location: abdomen Pain Descriptors / Indicators: Sore, Discomfort Pain Intervention(s): Monitored  during session, Repositioned    Home Living Family/patient expects to be discharged to:: Private residence Living Arrangements: Other relatives;Non-relatives/Friends (sister, bf, 2 dogs) Available Help at Discharge: Family Type of Home: House Home Access: Level entry       Home Layout: One level Home Equipment: Other (comment);Cane - single point Additional Comments: lift chair    Prior Function Prior Level of Function : Independent/Modified Independent             Mobility Comments: independent, 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, drives, uses SPC as needed due to chronic back pain, does PT on an app and has a Engineer, maintenance ADLs Comments: independent     Hand Dominance   Dominant Hand: Right    Extremity/Trunk Assessment   Upper Extremity Assessment Upper Extremity Assessment: Defer to OT evaluation    Lower Extremity Assessment Lower Extremity Assessment: Generalized weakness (but functional)    Cervical / Trunk Assessment Cervical / Trunk Assessment: Normal  Communication   Communication: No difficulties  Cognition Arousal/Alertness: Awake/alert Behavior During Therapy: WFL for tasks assessed/performed Overall Cognitive Status: Within Functional Limits for tasks assessed                                          General Comments General comments (skin integrity, edema, etc.): HR ranging from 90-121 bpm. Sp02 remained in 90s on RA. Sister present during session.    Exercises     Assessment/Plan    PT Assessment Patient needs continued PT services  PT Problem List Decreased strength;Decreased mobility;Pain;Decreased activity tolerance;Cardiopulmonary status limiting activity       PT Treatment Interventions Therapeutic activities;Gait training;Therapeutic exercise;Patient/family education;Balance training    PT Goals (Current goals can be found in the Care Plan section)  Acute Rehab PT Goals Patient Stated Goal: to go home PT Goal  Formulation: With patient Time For Goal Achievement: 11/07/22 Potential to Achieve Goals: Good    Frequency Min 3X/week     Co-evaluation               AM-PAC PT "6 Clicks" Mobility  Outcome Measure Help needed turning from your back to your side while in a flat bed without using bedrails?: None Help needed moving from lying on your back to sitting on the side of a flat bed without using bedrails?: None Help needed moving to and from a bed to a chair (including a wheelchair)?: A Little Help needed standing up from a chair using your arms (e.g., wheelchair or bedside chair)?: A Little Help needed to walk in hospital room?: A Little Help needed climbing 3-5 steps with a railing? : A Little 6 Click Score: 20    End of Session   Activity Tolerance: Patient tolerated treatment well;Patient limited by fatigue Patient left: in bed;with call bell/phone within reach;with family/visitor present Nurse Communication: Mobility status PT Visit Diagnosis: Pain;Muscle weakness (generalized) (M62.81);Difficulty in walking, not elsewhere classified (R26.2) Pain - part of body:  (abdomen)    Time: 1033-1100 PT Time Calculation (min) (ACUTE ONLY): 27 min   Charges:   PT Evaluation $PT Eval Low Complexity: 1 Low PT Treatments $Gait Training: 8-22 mins        Marisa Severin, PT, DPT Acute Rehabilitation Services Secure chat preferred Office [phone]  Marguarite Arbour A Deepti Gunawan 10/24/2022, 12:38 PM

## 2022-10-24 NOTE — Progress Notes (Signed)
[facility] PROGRESS NOTE   Catherine [name] [id number] DOB: [birth year] DOA: 10/17/2022  PCP: Aura Dials, MD  Brief History/Interval Summary:  52 y.o. female with past medical history of essential hypertension, previous history of alcoholism but none in the last 4 years who initially presented to the emergency department about a week ago with concern for chest pain.  During that evaluation she was found to have abnormal LFTs.  However it does not appear the patient was complaining of abdominal pain at that time.  She was asked to see her primary care provider.  She went to her primary care provider's office and had blood work done which showed elevated liver enzymes.  And then that night she started developing abdominal discomfort radiating to the right shoulder.  Found to have elevated lipase level.  She was admitted for further management of acute biliary pancreatitis.    Consultants: Gastroenterology.  General surgery  Procedures:   ERCP 2/18 Impression:               - The major papilla appeared to be slightly bulging.                           - Choledocholithiasis was found. Complete removal                            was accomplished by biliary sphincterotomy and                            balloon extraction.                           - A biliary sphincterotomy was performed.                           - The biliary tree was swept and nothing was found                            the end of the procedure.    Subjective/Interval History:  Reports some migraine today, abdomen is sore, but feeling better, no nausea, no vomiting, tolerating her diet.      Assessment/Plan:  Acute biliary pancreatitis/ileus Likely secondary to gallstones.  CT scan showed inflammation around the pancreas without any concerning features.  Patient underwent MRCP which confirmed pancreatitis and suggested cholelithiasis.  Showed dilated bile ducts. Patient was seen by  gastroenterology. Patient underwent ERCP on 2/18 and was found to have choledocholithiasis.  She underwent biliary sphincterotomy. Lipase level has improved. -Status post laparoscopic cholecystectomy 2/21, further management per general surgery, tolerating clear liquid diet, abdomen is soft, drain output is thin, dark sanguinous, she did receive surgical snow in the liver bed. -Remains with significant leukocytosis, continue ciprofloxacin and metronidazole, repeat CBC in a.m.  -Advanced to soft diet today.      Abnormal LFTs with cholelithiasis This is all secondary to choledocholithiasis.  Labs are improving after biliary sphincterotomy.  Bilirubin is down to 2.1.  Alkaline phosphatase is also improved.  Hepatitis panel is unremarkable.    Hyponatremia and hypokalemia Difficultly low with potassium at 2.8, repleted  Hypoglycemia Secondary to poor oral intake.  Will give D50.  Start D5 infusion.  Essential hypertension -Blood pressure started to increase, will resume  amlodipine and Toprol-XL (lower dose), continue hold lisinopril and hydrochlorothiazide.  Mildly elevated beta-hCG She is status post abdominal hysterectomy with bilateral salpingo-oophorectomy.  The level is likely erroneous.  Abnormal EKG Initial EKG suggested prolonged QT interval but it was a misread.  EKG from 2/18 does not show any QT prolongation.  Obesity Estimated body mass index is 38.99 kg/m as calculated from the following:   Height as of this encounter: '5\' 9"'$  (1.753 m).   Weight as of this encounter: 119.7 kg.  DVT Prophylaxis: Lovenox Code Status: Full code Family Communication: Discussed with the patient Disposition Plan: Hopefully return home when improved.  Start mobilizing.  Status is: Inpatient Remains inpatient appropriate because: Acute pancreatitis     Medications: Scheduled:  amLODipine  2.5 mg Oral Daily   docusate sodium  100 mg Oral BID   enoxaparin (LOVENOX) injection  60 mg  Subcutaneous Q24H   feeding supplement  1 Container Oral TID BM   metoprolol succinate  12.5 mg Oral Daily   polyethylene glycol  17 g Oral Daily   Continuous:  ciprofloxacin 400 mg (10/24/22 0620)   dextrose 5 % and 0.9 % NaCl with KCl 20 mEq/L 50 mL/hr (10/24/22 1159)   methocarbamol (ROBAXIN) IV     metronidazole 500 mg (10/24/22 1119)   [id number] **OR** acetaminophen, albuterol, bisacodyl, butalbital-acetaminophen-caffeine, hydrALAZINE, HYDROmorphone (DILAUDID) injection, methocarbamol (ROBAXIN) IV, ondansetron **OR** ondansetron (ZOFRAN) IV, oxyCODONE  Antibiotics: Anti-infectives (From admission, onward)    Start     Dose/Rate Route Frequency Ordered Stop   10/18/22 1100  metroNIDAZOLE (FLAGYL) IVPB 500 mg        500 mg 100 mL/hr over 60 Minutes Intravenous Every 12 hours 10/18/22 1010     10/18/22 1100  ciprofloxacin (CIPRO) IVPB 400 mg        400 mg 200 mL/hr over 60 Minutes Intravenous Every 12 hours 10/18/22 1010         Objective:  Vital Signs  Vitals:   10/23/22 2159 10/23/22 2321 10/24/22 0348 10/24/22 0800  BP: (!) 145/97 135/89 (!) 137/91 125/88  Pulse: 95 96 75 90  Resp: 16 20 (!) 21 (!) 28  Temp: 98.6 F (37 C) 98.4 F (36.9 C) 98.1 F (36.7 C) 98.1 F (36.7 C)  TempSrc: Oral Oral Oral Oral  SpO2:  94% 96% 93%  Weight:      Height:        Intake/Output Summary (Last 24 hours) at 10/24/2022 1549 Last data filed at 10/24/2022 1438 Gross per 24 hour  Intake 1320 ml  Output 40 ml  Net 1280 ml   Filed Weights   10/17/22 0912  Weight: 119.7 kg    Awake Alert, Oriented X 3, No new F.N deficits, Normal affect Symmetrical Chest wall movement, Good air movement bilaterally, CTAB RRR,No Gallops,Rubs or new Murmurs, No Parasternal Heave +ve B.Sounds, mild distention, appropriately tender, incision C/D/I, drain with serosanguineous drainage in JP No Cyanosis, Clubbing or edema, No new Rash or bruise        Lab Results:  Data Reviewed: I  have personally reviewed following labs and reports of the imaging studies  CBC: Recent Labs  Lab 10/20/22 0312 10/21/22 0420 10/22/22 0438 10/23/22 0655 10/24/22 0339  WBC 21.3* 21.0* 18.5* 17.8* 19.7*  HGB 12.6 10.9* 10.9* 10.4* 10.9*  HCT 35.2* 31.7* 32.2* 29.5* 31.7*  MCV 88.9 90.8 90.7 88.1 90.3  PLT 216 230 267 278 [id number]    Basic Metabolic Panel: Recent Labs  Lab 10/18/22 0745  10/19/22 [id number] 10/20/22 [id number] 10/21/22 0420 10/22/22 0438 10/23/22 0655 10/24/22 0339  NA 133*   < > 134* 133* 136 135 137  K 2.9*   < > 3.0* 3.0* 3.3* 2.8* 3.8  CL 101   < > 103 103 105 105 104  CO2 19*   < > 20* 19* '22 24 23  '$ GLUCOSE 82   < > 68* 93 111* 88 93  BUN 13   < > 7 <5* 6 6 5*  CREATININE 0.72   < > 0.67 0.48 0.51 0.48 0.55  CALCIUM 7.9*   < > 8.5* 7.9* 8.2* 7.6* 8.2*  MG 2.2  --  1.8  --   --  1.8  --   PHOS  --   --   --   --   --   --  3.1   < > = values in this interval not displayed.    GFR: Estimated Creatinine Clearance: 115.1 mL/min (by C-G formula based on SCr of 0.55 mg/dL).  Liver Function Tests: Recent Labs  Lab 10/19/22 0412 10/20/22 0312 10/21/22 0420 10/22/22 0438 10/23/22 0655  AST 91* 48* 23 47* 29  ALT 311* 242* 131* 109* 73*  ALKPHOS 280* 257* 165* 142* 126  BILITOT 2.2* 2.1* 1.4* 1.1 1.0  PROT 5.6* 6.4* 5.4* 5.3* 5.1*  ALBUMIN 2.5* 2.9* 2.2* 2.1* 2.0*    Recent Labs  Lab 10/18/22 0745 10/19/22 0412 10/20/22 0312 10/21/22 0420  LIPASE 294* 59* 33 24     Lipid Profile: No results for input(s): "CHOL", "HDL", "LDLCALC", "TRIG", "CHOLHDL", "LDLDIRECT" in the last 72 hours.   Radiology Studies: No results found.     LOS: 7 days   [facility]  [facility] Pager on [email or web address]  10/24/2022, 3:49 PM

## 2022-10-24 NOTE — Progress Notes (Signed)
[facility] Progress Note  3 Days Post-Op  Subjective: CC:  Feels better- reports her abd is sore. Getting out of bed better. Working with PT.  Denies nausea or vomiting. Tolerating a diet  Objective: Vital signs in last 24 hours: Temp:  [97.8 F (36.6 C)-98.6 F (37 C)] 98.1 F (36.7 C) (02/24 0800) Pulse Rate:  [75-96] 90 (02/24 0800) Resp:  [16-28] 28 (02/24 0800) BP: (119-145)/(80-97) 125/88 (02/24 0800) SpO2:  [93 %-98 %] 93 % (02/24 0800) Last BM Date : 10/15/22  Intake/Output from previous day: 02/23 0701 - 02/24 0700 In: 1080 [P.O.:1080] Out: 1480 [Urine:1400; Drains:80] Intake/Output this shift: Total I/O In: 240 [P.O.:240] Out: -   PE: Gen:  Alert, NAD, pleasant Pulm:  Normal effort Abd: Soft, appropriately tender, mild distention, incisions c/d/i, drain with SS drainage in JP Skin: warm and dry, no rashes  Psych: A&Ox3   Lab Results:  Recent Labs    10/23/22 0655 10/24/22 0339  WBC 17.8* 19.7*  HGB 10.4* 10.9*  HCT 29.5* 31.7*  PLT 278 297    BMET Recent Labs    10/23/22 0655 10/24/22 0339  NA 135 137  K 2.8* 3.8  CL 105 104  CO2 24 23  GLUCOSE 88 93  BUN 6 5*  CREATININE 0.48 0.55  CALCIUM 7.6* 8.2*    PT/INR No results for input(s): "LABPROT", "INR" in the last 72 hours. CMP     Component Value Date/Time   NA 137 10/24/2022 0339   NA 141 08/17/2018 0000   K 3.8 10/24/2022 0339   CL 104 10/24/2022 0339   CO2 23 10/24/2022 0339   GLUCOSE 93 10/24/2022 0339   BUN 5 (L) 10/24/2022 0339   BUN 11 08/17/2018 0000   CREATININE 0.55 10/24/2022 0339   CALCIUM 8.2 (L) 10/24/2022 0339   PROT 5.1 (L) 10/23/2022 0655   ALBUMIN 2.0 (L) 10/23/2022 0655   AST 29 10/23/2022 0655   ALT 73 (H) 10/23/2022 0655   ALKPHOS 126 10/23/2022 0655   BILITOT 1.0 10/23/2022 0655   GFRNONAA >60 10/24/2022 0339   GFRAA >60 11/28/2015 1227   Lipase     Component Value Date/Time   LIPASE 24 10/21/2022 0420        Studies/Results: No results found.  Anti-infectives: Anti-infectives (From admission, onward)    Start     Dose/Rate Route Frequency Ordered Stop   10/18/22 1100  metroNIDAZOLE (FLAGYL) IVPB 500 mg        500 mg 100 mL/hr over 60 Minutes Intravenous Every 12 hours 10/18/22 1010     10/18/22 1100  ciprofloxacin (CIPRO) IVPB 400 mg        400 mg 200 mL/hr over 60 Minutes Intravenous Every 12 hours 10/18/22 1010          Assessment/Plan Gallstone pancreatitis Chronic cholecystitis POD#3 s/p laparoscopic cholecystectomy, drain placement 2/21 Dr. Kae Heller - AFVSS, WBC up to 19 today, Cont abx and recheck in AM - Cont soft diet  - monitor drain      LOS: 7 days   I reviewed nursing notes, hospitalist notes, last 24 h vitals and pain scores, last 48 h intake and output, last 24 h labs and trends, and last 24 h imaging results.   Rosario Adie, MD  [facility]

## 2022-10-25 LAB — COMPREHENSIVE METABOLIC PANEL
ALT: 48 U/L — ABNORMAL HIGH (ref 0–44)
AST: 25 U/L (ref 15–41)
Albumin: 2.3 g/dL — ABNORMAL LOW (ref 3.5–5.0)
Alkaline Phosphatase: 122 U/L (ref 38–126)
Anion gap: 10 (ref 5–15)
BUN: 5 mg/dL — ABNORMAL LOW (ref 6–20)
CO2: 24 mmol/L (ref 22–32)
Calcium: 8.6 mg/dL — ABNORMAL LOW (ref 8.9–10.3)
Chloride: 105 mmol/L (ref 98–111)
Creatinine, Ser: 0.61 mg/dL (ref 0.44–1.00)
GFR, Estimated: >60 mL/min (ref >60)
Glucose, Bld: 101 mg/dL — ABNORMAL HIGH (ref 70–99)
Potassium: 4 mmol/L (ref 3.5–5.1)
Sodium: 139 mmol/L (ref 135–145)
Total Bilirubin: 0.9 mg/dL (ref 0.3–1.2)
Total Protein: 5.7 g/dL — ABNORMAL LOW (ref 6.5–8.1)

## 2022-10-25 LAB — CBC
HCT: 32.6 % — ABNORMAL LOW (ref 36.0–46.0)
Hemoglobin: 11.4 g/dL — ABNORMAL LOW (ref 12.0–15.0)
MCH: 31.2 pg (ref 26.0–34.0)
MCHC: 35 g/dL (ref 30.0–36.0)
MCV: 89.3 fL (ref 80.0–100.0)
Platelets: 355 10*3/uL (ref 150–400)
RBC: 3.65 MIL/uL — ABNORMAL LOW (ref 3.87–5.11)
RDW: 14.8 % (ref 11.5–15.5)
WBC: 20.3 10*3/uL — ABNORMAL HIGH (ref 4.0–10.5)
nRBC: 0.1 % (ref 0.0–0.2)

## 2022-10-25 MED ORDER — METRONIDAZOLE 500 MG PO TABS
500.0000 mg | ORAL_TABLET | Freq: Three times a day (TID) | ORAL | Status: DC
  Administered 2022-10-25: 500 mg via ORAL
  Filled 2022-10-25: qty 1

## 2022-10-25 MED ORDER — CIPROFLOXACIN HCL 500 MG PO TABS: 500.0000 mg | ORAL_TABLET | Freq: Two times a day (BID) | ORAL | 0 refills | Status: AC

## 2022-10-25 MED ORDER — METRONIDAZOLE 500 MG PO TABS: 500.0000 mg | ORAL_TABLET | Freq: Three times a day (TID) | ORAL | 0 refills | Status: AC

## 2022-10-25 MED ORDER — CIPROFLOXACIN HCL 500 MG PO TABS: 500.0000 mg | ORAL_TABLET | Freq: Two times a day (BID) | ORAL | Status: DC

## 2022-10-25 NOTE — Discharge Summary (Addendum)
Physician Discharge Summary  Catherine [name] [id number] DOB: March 19, 1971 DOA: 10/17/2022  PCP: Aura Dials, MD  Admit date: 10/17/2022 Discharge date: 10/25/2022  Admitted From: (HomeF) Disposition:  (Home)  Recommendations for Outpatient Follow-up:  Follow up with PCP next week Please obtain CMP/CBC to ensure improving white blood cell count  Home Health: (No)   CODE STATUS: (FULL) Diet recommendation: Heart   Brief/Interim Summary:    52 y.o. female with past medical history of essential hypertension, previous history of alcoholism but none in the last 4 years who initially presented to the emergency department about a week ago with concern for chest pain.  During that evaluation she was found to have abnormal LFTs.  However it does not appear the patient was complaining of abdominal pain at that time.  Workup was significant for gallstone pancreatitis, and chronic cholecystitis, please see discussion below.  Acute biliary pancreatitis/ileus Chronic cholecystitis Likely secondary to gallstones.  CT scan showed inflammation around the pancreas without any concerning features.  Patient underwent MRCP which confirmed pancreatitis and suggested cholelithiasis.  Showed dilated bile ducts. Patient was seen by gastroenterology. Patient underwent ERCP on 2/18 and was found to have choledocholithiasis.  She underwent biliary sphincterotomy. Lipase level has improved. -Status post laparoscopic cholecystectomy 2/21, management per general surgery, tolerating clear liquid diet, abdomen is soft, drain output is thin, dark sanguinous, she did receive surgical snow in the liver bed.  Repeat drain to be discontinued prior to discharge per general surgery recommendation -Was treated with ciprofloxacin and Flagyl, she still with significant leukocytosis, patient insisting on going home today, I have offered her to stay another 1 or 2 days till leukocytosis trending down, but she wants to go  home today, she is afebrile, pain has resolved, tolerating oral intake, surgery has cleared her from surgical standpoint, will go ahead and discharge her today, she will be discharged on oral Cipro and Flagyl for next 3 days, discussed with the patient, she will follow-up with PCP regarding repeat Pete labs to ensure blood cell count keep trending down.         Abnormal LFTs with cholelithiasis This is all secondary to choledocholithiasis.  Labs are improving after biliary sphincterotomy.  Bilirubin is down to 2.1.  Alkaline phosphatase is also improved.  Hepatitis panel is unremarkable.     Hyponatremia and hypokalemia resolved   Hypoglycemia Secondary to poor oral intake.  resolved   Essential hypertension -Blood pressure has been controlled during hospital stay on Toprol XL, will continue to hold lisinopril/hydrochlorothiazide and Norvasc at time of discharge, this can be resumed at a later time if blood pressure started to increase.  .   Mildly elevated beta-hCG She is status post abdominal hysterectomy with bilateral salpingo-oophorectomy.  The level is likely erroneous.   Abnormal EKG Initial EKG suggested prolonged QT interval but it was a misread.  EKG from 2/18 does not show any QT prolongation.   Obesity Estimated body mass index is 38.99 kg/m as calculated from the following:   Height as of this encounter: '5\' 9"'$  (1.753 m).   Weight as of this encounter: 119.7 kg.     Discharge Diagnoses:  Principal Problem:   Acute gallstone pancreatitis Active Problems:   Cholelithiases   Abnormal LFTs   Hypokalemia   Hyponatremia   Acute biliary pancreatitis    Discharge Instructions  Discharge Instructions     Diet - low sodium heart healthy   Complete by: As directed    Discharge instructions  Complete by: As directed    Follow with Primary MD Aura Dials, MD in 3 days   Get CBC, CMP, checked  by Primary MD next visit.    Activity: As tolerated with Full  fall precautions use walker/cane & assistance as needed   Disposition Home    Diet: Heart Healthy    On your next visit with your primary care physician please Get Medicines reviewed and adjusted.   Please request your Prim.MD to go over all Hospital Tests and Procedure/Radiological results at the follow up, please get all Hospital records sent to your Prim MD by signing hospital release before you go home.   If you experience worsening of your admission symptoms, develop shortness of breath, life threatening emergency, suicidal or homicidal thoughts you must seek medical attention immediately by calling 911 or calling your MD immediately  if symptoms less severe.  You Must read complete instructions/literature along with all the possible adverse reactions/side effects for all the Medicines you take and that have been prescribed to you. Take any new Medicines after you have completely understood and accpet all the possible adverse reactions/side effects.   Do not drive, operating heavy machinery, perform activities at heights, swimming or participation in water activities or provide baby sitting services if your were admitted for syncope or siezures until you have seen by Primary MD or a Neurologist and advised to do so again.  Do not drive when taking Pain medications.    Do not take more than prescribed Pain, Sleep and Anxiety Medications  Special Instructions: If you have smoked or chewed Tobacco  in the last 2 yrs please stop smoking, stop any regular Alcohol  and or any Recreational drug use.  Wear Seat belts while driving.   Please note  You were cared for by a hospitalist during your hospital stay. If you have any questions about your discharge medications or the care you received while you were in the hospital after you are discharged, you can call the unit and asked to speak with the hospitalist on call if the hospitalist that took care of you is not available. Once you are  discharged, your primary care physician will handle any further medical issues. Please note that NO REFILLS for any discharge medications will be authorized once you are discharged, as it is imperative that you return to your primary care physician (or establish a relationship with a primary care physician if you do not have one) for your aftercare needs so that they can reassess your need for medications and monitor your lab values.   Increase activity slowly   Complete by: As directed       Allergies as of 10/25/2022       Reactions   Augmentin [amoxicillin-pot Clavulanate] Anaphylaxis, Rash, Other (See Comments)   Severe stomach cramping   Penicillins Anaphylaxis, Rash   Has patient had a PCN reaction causing immediate rash, facial/tongue/throat swelling, SOB or lightheadedness with hypotension:  Has patient had a PCN reaction causing severe rash involving mucus membranes or skin necrosis: Has patient had a PCN reaction that required hospitalization Has patient had a PCN reaction occurring within the last 10 years: If all of the above answers are "NO", then may proceed with Cephalosporin use.   Depakote [divalproex Sodium] Nausea And Vomiting   Latex Hives, Swelling, Rash   Throat swelling        Medication List     STOP taking these medications    acetaminophen 500 MG tablet  Commonly known as: TYLENOL   amLODipine 2.5 MG tablet Commonly known as: NORVASC   lisinopril-hydrochlorothiazide 10-12.5 MG tablet Commonly known as: ZESTORETIC       TAKE these medications    aspirin EC 81 MG tablet Take 1 tablet (81 mg total) by mouth daily. Swallow whole.   ciprofloxacin 500 MG tablet Commonly known as: CIPRO Take 1 tablet (500 mg total) by mouth 2 (two) times daily for 3 days.   cyclobenzaprine 10 MG tablet Commonly known as: FLEXERIL Take 10 mg by mouth 3 (three) times daily as needed for muscle spasms.   gabapentin 300 MG capsule Commonly known as: NEURONTIN Take  300 mg by mouth 3 (three) times daily.   meloxicam 15 MG tablet Commonly known as: MOBIC Take 15 mg by mouth daily as needed for pain.   metoprolol succinate 25 MG 24 hr tablet Commonly known as: TOPROL-XL Take 1 tablet (25 mg total) by mouth every evening. What changed: when to take this   metroNIDAZOLE 500 MG tablet Commonly known as: FLAGYL Take 1 tablet (500 mg total) by mouth every 8 (eight) hours.   nitroGLYCERIN 0.3 MG SL tablet Commonly known as: NITROSTAT Place 0.3 mg under the tongue every 5 (five) minutes x 3 doses as needed for chest pain.   pantoprazole 40 MG tablet Commonly known as: Protonix Take 1 tablet (40 mg total) by mouth daily.   Potassium Chloride ER 20 MEQ Tbcr Take 1 tablet (20 mEq total) by mouth daily.   sucralfate 1 g tablet Commonly known as: Carafate Take 1 tablet (1 g total) by mouth 4 (four) times daily -  with meals and at bedtime.   Vitamin C Chew Chew 2 each by mouth daily.        Follow-up Information     Maczis, Carlena Hurl, PA-C Follow up.   Specialty: General Surgery Why: our office is scheduling you for post-operative follow up, please call to confirm appointment date/time. Contact information: [address] [place] [address] [phone]         Aura Dials, MD Follow up.   Specialty: Family Medicine Contact information: [employer] [employer] [place] [address] [phone]                Allergies  Allergen Reactions   Augmentin [Amoxicillin-Pot Clavulanate] Anaphylaxis, Rash and Other (See Comments)    Severe stomach cramping   Penicillins Anaphylaxis and Rash    Has patient had a PCN reaction causing immediate rash, facial/tongue/throat swelling, SOB or lightheadedness with hypotension:  Has patient had a PCN reaction causing severe rash involving mucus membranes or skin necrosis: Has patient had a PCN reaction that required hospitalization Has patient had a PCN reaction occurring within  the last 10 years: If all of the above answers are "NO", then may proceed with Cephalosporin use.    Depakote [Divalproex Sodium] Nausea And Vomiting   Latex Hives, Swelling and Rash    Throat swelling    Consultations: GI General surgery   Procedures/Studies: DG ERCP  Result Date: 10/19/2022 CLINICAL DATA:  [id number] Surgery, elective [id number] EXAM: ERCP COMPARISON:  MRCP 10/18/2022.  CT AP, 10/17/2022. FLUOROSCOPY: Exposure Index (as provided by the fluoroscopic device): 27.4 mGy Kerma FINDINGS: Multiple, limited oblique planar images of the RIGHT upper quadrant obtained C-arm. Images demonstrating flexible endoscopy, biliary duct cannulation, sphincterotomy, retrograde cholangiogram and balloon sweep. No biliary ductal dilation. No evidence of biliary filling defect is demonstrated. IMPRESSION: Fluoroscopic imaging for  ERCP. No discrete biliary defect is demonstrated For complete description of intra procedural findings, please see performing service dictation. Electronically Signed   By: Michaelle Birks M.D.   On: 10/19/2022 06:31   MR ABDOMEN MRCP W WO CONTAST  Result Date: 10/18/2022 CLINICAL DATA:  Right upper quadrant pain.  Gallstones. EXAM: MRI ABDOMEN WITHOUT AND WITH CONTRAST (INCLUDING MRCP) TECHNIQUE: Multiplanar multisequence MR imaging of the abdomen was performed both before and after the administration of intravenous contrast. Heavily T2-weighted images of the biliary and pancreatic ducts were obtained, and three-dimensional MRCP images were rendered by post processing. CONTRAST:  51m GADAVIST GADOBUTROL 1 MMOL/ML IV SOLN COMPARISON:  CT AP 10/17/2022 FINDINGS: Lower chest: No acute findings. Hepatobiliary: No suspicious enhancing liver lesions. Sludge and multiple tiny gallstones identified within the gallbladder. The gallbladder wall is thickened measuring 5 mm. No pericholecystic fluid. Fusiform dilatation of the CBD with mild diffuse intrahepatic bile duct dilatation. The CBD  measures up to 1.1 cm. Enhancement of the bile ducts identified. On the MRCP images the intrahepatic bile ducts have somewhat of an irregular and beaded appearance. No definite signs of choledocholithiasis. However, at the convergence of the dilated CBD and normal caliber main pancreatic duct there is a tiny T2 hypointense structure which measures 3 mm, image 21/9. This is seen on only 1 sequence. Small impacted stone at the ampulla cannot be excluded. Pancreas: No signs of main duct dilatation or mass. Diffuse pancreatic edema with peripancreatic fat stranding is identified. Free fluid extends into the retroperitoneum and along the left pericolic gutter. No loculated fluid collections identified to suggest abscess or pseudocyst. Spleen:  Within normal limits in size and appearance. Adrenals/Urinary Tract: No masses identified. No evidence of hydronephrosis. Stomach/Bowel: Small hiatal hernia. Stomach appears nondistended. No pathologic dilatation of the large or small bowel loops. Mild increase caliber of the jejunum may reflect paralytic ileus secondary to upper abdominal inflammation Vascular/Lymphatic: Normal caliber of the abdominal aorta. Upper abdominal vascularity is patent. Prominent upper abdominal lymph nodes are likely reactive. Other: Fat stranding and fluid identified within the retroperitoneum and extending along the pericolic gutters bilaterally. No discrete fluid collection to suggest abscess. Musculoskeletal: No suspicious bone lesions identified. IMPRESSION: 1. Imaging findings compatible with acute pancreatitis. No signs pancreatic necrosis or pseudocyst formation. 2. Sludge and multiple tiny gallstones identified within the gallbladder. The gallbladder wall is thickened, which may be nonspecific in the setting of acute pancreatitis. 3. Fusiform dilatation of the CBD with mild diffuse intrahepatic bile duct dilatation. Enhancement of the bile ducts identified. On the MRCP images the intrahepatic  bile ducts have somewhat irregular and beaded appearance. Correlate for any clinical signs symptoms of cholangitis. 4. No definite evidence for choledocholithiasis. However, a tiny focus of low T2 signal is identified on the coronal FIESTA sequence at the convergence of the common bile duct and main pancreatic duct, equivocal for tiny impacted stone at the ampulla. This is only appreciated on 1 image of 1 sequence and cannot be confirmed on the other sequences. 5. Small hiatal hernia. 6. Mild increase caliber of the jejunum may reflect paralytic ileus secondary to upper abdominal inflammation. Electronically Signed   By: TKerby MoorsM.D.   On: 10/18/2022 09:12   CT ABDOMEN PELVIS W CONTRAST  Result Date: 10/17/2022 CLINICAL DATA:  Biliary dilatation seen on ultrasound examination. Right-sided abdominal pain. EXAM: CT ABDOMEN AND PELVIS WITH CONTRAST TECHNIQUE: Multidetector CT imaging of the abdomen and pelvis was performed using the standard protocol following bolus administration  of intravenous contrast. RADIATION DOSE REDUCTION: This exam was performed according to the departmental dose-optimization program which includes automated exposure control, adjustment of the mA and/or kV according to patient size and/or use of iterative reconstruction technique. CONTRAST:  191m OMNIPAQUE IOHEXOL 350 MG/ML SOLN COMPARISON:  Abdominal ultrasound, same date. FINDINGS: Lower chest: Status breathing motion artifact but no pulmonary lesions or pleural effusions. Heart is normal in size. No pericardial effusion. Small hiatal hernia. Hepatobiliary: Moderate intrahepatic biliary dilatation and common bile duct dilatation. Gallbladder demonstrates mild wall thickening. Gallstones are difficult to identified but were present on the ultrasound. No hepatic lesions. The portal and hepatic veins are patent. No obvious obstructing common bile duct stone but recommend MRI abdomen/MRCP without with contrast for further evaluation.  Pancreas: Inflammation around the pancreas but no CT findings to suggest acute pancreatitis. No pancreatic ductal dilatation or pancreatic head mass to account for the patient's biliary obstruction. No ampullary lesion is identified either. Spleen: Normal size.  No focal lesions. Adrenals/Urinary Tract: Adrenal glands and kidneys are normal. The bladder is normal. Stomach/Bowel: The stomach is unremarkable. There is inflammation surrounding the second and third portions of the duodenum but I do not think this is a primary duodenal process. The small bowel and colon are unremarkable. The terminal ileum and appendix are normal. No colonic mass or obstruction. Vascular/Lymphatic: The aorta and branch vessels are patent. The major venous structures are patent. No mesenteric or retroperitoneal mass or adenopathy. Small scattered lymph nodes are noted. Reproductive: Surgically absent. Other: No pelvic mass or adenopathy. Small amount of fluid in the anterior pararenal spaces and also in the left pericolic gutter. No inguinal mass or adenopathy. No abdominal wall hernia or subcutaneous lesions. Musculoskeletal: No significant bony findings. IMPRESSION: 1. Moderate intrahepatic biliary dilatation and common bile duct dilatation. No obvious obstructing common bile duct stone but this is still most likely. No pancreatic head mass or ampullary lesion. Recommend MRI abdomen/MRCP without with contrast for further evaluation. 2. Inflammation around the pancreas and duodenum but no CT findings to suggest acute pancreatitis. 3. Mild gallbladder wall thickening but no pericholecystic fluid. Gallstones were noted on the abdominal ultrasound examination. 4. No other significant abdominal/pelvic findings. Electronically Signed   By: PMarijo SanesM.D.   On: 10/17/2022 11:45   UKoreaAbdomen Limited RUQ (LIVER/GB)  Result Date: 10/17/2022 CLINICAL DATA:  Right upper quadrant pain. EXAM: ULTRASOUND ABDOMEN LIMITED RIGHT UPPER QUADRANT  COMPARISON:  08/22/2018. FINDINGS: Gallbladder: Multiple gallstones. Small amount of sludge. No wall thickening or pericholecystic fluid. Common bile duct: Diameter: Dilated to 13 mm.  No visualized duct stone. Liver: Increased parenchymal echogenicity. Mild intrahepatic bile duct dilation. No liver mass. Portal vein is patent on color Doppler imaging with normal direction of blood flow towards the liver. Other: Trace ascites. IMPRESSION: 1. Dilated common bile duct. Mild intrahepatic bile duct dilation. Duct dilation is new compared to the abdomen ultrasound from 08/22/2018. Consider a distal duct obstruction/stone, which could be further assessed with either ERCP or MRCP. 2. Multiple mobile gallstones with some gallbladder sludge, but no evidence of acute cholecystitis. 3. Increased liver parenchymal echogenicity suggests hepatic steatosis. Electronically Signed   By: DLajean ManesM.D.   On: 10/17/2022 10:15   DG Chest 2 View  Result Date: 10/11/2022 CLINICAL DATA:  Acute chest pain. EXAM: CHEST - 2 VIEW COMPARISON:  05/17/2022 and prior studies FINDINGS: Telemetry leads overlie the chest. The cardiomediastinal silhouette is unremarkable. There is no evidence of focal airspace disease, pulmonary edema,  suspicious pulmonary nodule/mass, pleural effusion, or pneumothorax. No acute bony abnormalities are identified. IMPRESSION: No active cardiopulmonary disease. Electronically Signed   By: Margarette Canada M.D.   On: 10/11/2022 13:07      Subjective:  Denies any complaints, she still reports constipation, no nausea, no vomiting, no abdominal pain, no fever or chills, she wants to go home today. Discharge Exam: Vitals:   10/24/22 2352 10/25/22 0428  BP: 121/86 121/85  Pulse:  88  Resp: (!) 21 (!) 23  Temp: 97.8 F (36.6 C) 98.4 F (36.9 C)  SpO2:  93%   Vitals:   10/24/22 1706 10/24/22 1921 10/24/22 2352 10/25/22 0428  BP: 119/84 119/87 121/86 121/85  Pulse: 80   88  Resp: 19 17 (!) 21 (!) 23   Temp: 97.9 F (36.6 C) 97.8 F (36.6 C) 97.8 F (36.6 C) 98.4 F (36.9 C)  TempSrc: Oral Oral Oral Oral  SpO2: 97%   93%  Weight:      Height:        General: Pt is alert, awake, not in acute distress Cardiovascular: RRR, S1/S2 +, no rubs, no gallops Respiratory: CTA bilaterally, no wheezing, no rhonchi Abdominal: Soft, NT, ND, bowel sounds + Extremities: no edema, no cyanosis    The results of significant diagnostics from this hospitalization (including imaging, microbiology, ancillary and laboratory) are listed below for reference.     Microbiology: No results found for this or any previous visit (from the past 240 hour(s)).   Labs: BNP (last 3 results) No results for input(s): "BNP" in the last 8760 hours. Basic Metabolic Panel: Recent Labs  Lab 10/20/22 0312 10/21/22 0420 10/22/22 0438 10/23/22 0655 10/24/22 0339 10/25/22 0250  NA 134* 133* 136 135 137 139  K 3.0* 3.0* 3.3* 2.8* 3.8 4.0  CL 103 103 105 105 104 105  CO2 20* 19* '22 24 23 24  '$ GLUCOSE 68* 93 111* 88 93 101*  BUN 7 <5* 6 6 5* 5*  CREATININE 0.67 0.48 0.51 0.48 0.55 0.61  CALCIUM 8.5* 7.9* 8.2* 7.6* 8.2* 8.6*  MG 1.8  --   --  1.8  --   --   PHOS  --   --   --   --  3.1  --    Liver Function Tests: Recent Labs  Lab 10/20/22 0312 10/21/22 0420 10/22/22 0438 10/23/22 0655 10/25/22 0250  AST 48* 23 47* 29 25  ALT 242* 131* 109* 73* 48*  ALKPHOS 257* 165* 142* 126 122  BILITOT 2.1* 1.4* 1.1 1.0 0.9  PROT 6.4* 5.4* 5.3* 5.1* 5.7*  ALBUMIN 2.9* 2.2* 2.1* 2.0* 2.3*   Recent Labs  Lab 10/19/22 0412 10/20/22 0312 10/21/22 0420  LIPASE 59* 33 24   No results for input(s): "AMMONIA" in the last 168 hours. CBC: Recent Labs  Lab 10/21/22 0420 10/22/22 0438 10/23/22 0655 10/24/22 0339 10/25/22 0250  WBC 21.0* 18.5* 17.8* 19.7* 20.3*  HGB 10.9* 10.9* 10.4* 10.9* 11.4*  HCT 31.7* 32.2* 29.5* 31.7* 32.6*  MCV 90.8 90.7 88.1 90.3 89.3  PLT 230 267 278 297 355   Cardiac Enzymes: No  results for input(s): "CKTOTAL", "CKMB", "CKMBINDEX", "TROPONINI" in the last 168 hours. BNP: Invalid input(s): "POCBNP" CBG: Recent Labs  Lab 10/21/22 2354 10/22/22 2333 10/23/22 0803 10/23/22 2202 10/24/22 0742  GLUCAP 111* 98 76 114* 99   D-Dimer No results for input(s): "DDIMER" in the last 72 hours. Hgb A1c No results for input(s): "HGBA1C" in the last 72 hours. Lipid  Profile No results for input(s): "CHOL", "HDL", "LDLCALC", "TRIG", "CHOLHDL", "LDLDIRECT" in the last 72 hours. Thyroid function studies No results for input(s): "TSH", "T4TOTAL", "T3FREE", "THYROIDAB" in the last 72 hours.  Invalid input(s): "FREET3" Anemia work up No results for input(s): "VITAMINB12", "FOLATE", "FERRITIN", "TIBC", "IRON", "RETICCTPCT" in the last 72 hours. Urinalysis    Component Value Date/Time   COLORURINE AMBER (A) 10/17/2022 2015   APPEARANCEUR CLEAR 10/17/2022 2015   LABSPEC 1.020 10/17/2022 2015   PHURINE 5.5 10/17/2022 2015   GLUCOSEU 100 (A) 10/17/2022 2015   HGBUR TRACE (A) 10/17/2022 2015   HGBUR negative 02/22/2009 0807   BILIRUBINUR LARGE (A) 10/17/2022 2015   BILIRUBINUR negative 01/01/2014 0944   KETONESUR 15 (A) 10/17/2022 2015   PROTEINUR NEGATIVE 10/17/2022 2015   UROBILINOGEN 0.2 01/01/2014 0944   UROBILINOGEN 0.2 10/01/2009 0814   NITRITE NEGATIVE 10/17/2022 2015   LEUKOCYTESUR NEGATIVE 10/17/2022 2015   Sepsis Labs Recent Labs  Lab 10/22/22 0438 10/23/22 0655 10/24/22 0339 10/25/22 0250  WBC 18.5* 17.8* 19.7* 20.3*   Microbiology No results found for this or any previous visit (from the past 240 hour(s)).   Time coordinating discharge: Over 30 minutes  SIGNED:   Phillips Climes, MD  [facility] 10/25/2022, 12:36 PM Pager   If 7PM-7AM, please contact night-coverage [email or web address]

## 2022-10-25 NOTE — Discharge Instructions (Signed)
[facility] LAPAROSCOPIC SURGERY: POST OP INSTRUCTIONS Always review your discharge instruction sheet given to you by the facility where your surgery was performed. IF YOU HAVE DISABILITY OR FAMILY LEAVE FORMS, YOU MUST BRING THEM TO THE OFFICE FOR PROCESSING.   DO NOT GIVE THEM TO YOUR DOCTOR.  PAIN CONTROL  First take acetaminophen (Tylenol) AND/or ibuprofen (Advil) to control your pain after surgery.  Follow directions on package.  Taking acetaminophen (Tylenol) and/or ibuprofen (Advil) regularly after surgery will help to control your pain and lower the amount of prescription pain medication you may need.  You should not take more than 3,000 mg (3 grams) of acetaminophen (Tylenol) in 24 hours.  You should not take ibuprofen (Advil), aleve, motrin, naprosyn or other NSAIDS if you have a history of stomach ulcers or chronic kidney disease.  A prescription for pain medication may be given to you upon discharge.  Take your pain medication as prescribed, if you still have uncontrolled pain after taking acetaminophen (Tylenol) or ibuprofen (Advil). Use ice packs to help control pain. If you need a refill on your pain medication, please contact your pharmacy.  They will contact our office to request authorization. Prescriptions will not be filled after 5pm or on week-ends.  HOME MEDICATIONS Take your usually prescribed medications unless otherwise directed.  DIET You should follow a light diet the first few days after arrival home.  Be sure to include lots of fluids daily. Avoid fatty, fried foods.   CONSTIPATION It is common to experience some constipation after surgery and if you are taking pain medication.  Increasing fluid intake and taking a stool softener (such as Colace) will usually help or prevent this problem from occurring.  A mild laxative (Milk of Magnesia or Miralax) should be taken according to package instructions if there are no bowel movements after 48  hours.  WOUND/INCISION CARE Most patients will experience some swelling and bruising in the area of the incisions.  Ice packs will help.  Swelling and bruising can take several days to resolve.  Unless discharge instructions indicate otherwise, follow guidelines below  STERI-STRIPS - you may remove your outer bandages 48 hours after surgery, and you may shower at that time.  You have steri-strips (small skin tapes) in place directly over the incision.  These strips should be left on the skin for 7-10 days.   DERMABOND/SKIN GLUE - you may shower in 24 hours.  The glue will flake off over the next 2-3 weeks. Any sutures or staples will be removed at the office during your follow-up visit.  ACTIVITIES You may resume regular (light) daily activities beginning the next day--such as daily self-care, walking, climbing stairs--gradually increasing activities as tolerated.  You may have sexual intercourse when it is comfortable.  Refrain from any heavy lifting or straining until approved by your doctor. You may drive when you are no longer taking prescription pain medication, you can comfortably wear a seatbelt, and you can safely maneuver your car and apply brakes.  FOLLOW-UP You should see your doctor in the office for a follow-up appointment approximately 2-3 weeks after your surgery.  You should have been given your post-op/follow-up appointment when your surgery was scheduled.  If you did not receive a post-op/follow-up appointment, make sure that you call for this appointment within a day or two after you arrive home to insure a convenient appointment time.  OTHER INSTRUCTIONS   WHEN TO CALL YOUR DOCTOR: Fever over 101.0 Inability to urinate Continued   bleeding from incision. Increased pain, redness, or drainage from the incision. Increasing abdominal pain  The clinic staff is available to answer your questions during regular business hours.  Please don't hesitate to call and ask to speak to  one of the nurses for clinical concerns.  If you have a medical emergency, go to the nearest emergency room or call 911.  A surgeon from [facility] is always on call at the hospital. [address], [address], [place], [place]  [address] ? [address] [address], [place], [place]   [address] [phone] ? [phone] ? FAX [phone] Web site: [email or web address]  

## 2022-10-25 NOTE — Progress Notes (Signed)
[facility] Progress Note  4 Days Post-Op  Subjective: CC:  Feels better each day- reports her abd is sore. Getting out of bed better. Working with PT.  Denies nausea or vomiting. Tolerating some of her diet  Objective: Vital signs in last 24 hours: Temp:  [97.8 F (36.6 C)-98.4 F (36.9 C)] 98.4 F (36.9 C) (02/25 0428) Pulse Rate:  [80-88] 88 (02/25 0428) Resp:  [17-23] 23 (02/25 0428) BP: (119-121)/(84-87) 121/85 (02/25 0428) SpO2:  [93 %-97 %] 93 % (02/25 0428) Last BM Date : 10/15/22  Intake/Output from previous day: 02/24 0701 - 02/25 0700 In: 18400.6 [P.O.:720; I.V.:3565.6; IV Piggyback:14115] Out: 25 [Drains:25] Intake/Output this shift: No intake/output data recorded.  PE: Gen:  Alert, NAD, pleasant Pulm:  Normal effort Abd: Soft, appropriately tender, no distention, incisions c/d/i, drain with SS drainage in JP Skin: warm and dry, no rashes  Psych: A&Ox3   Lab Results:  Recent Labs    10/24/22 0339 10/25/22 0250  WBC 19.7* 20.3*  HGB 10.9* 11.4*  HCT 31.7* 32.6*  PLT 297 355    BMET Recent Labs    10/24/22 0339 10/25/22 0250  NA 137 139  K 3.8 4.0  CL 104 105  CO2 23 24  GLUCOSE 93 101*  BUN 5* 5*  CREATININE 0.55 0.61  CALCIUM 8.2* 8.6*    PT/INR No results for input(s): "LABPROT", "INR" in the last 72 hours. CMP     Component Value Date/Time   NA 139 10/25/2022 0250   NA 141 08/17/2018 0000   K 4.0 10/25/2022 0250   CL 105 10/25/2022 0250   CO2 24 10/25/2022 0250   GLUCOSE 101 (H) 10/25/2022 0250   BUN 5 (L) 10/25/2022 0250   BUN 11 08/17/2018 0000   CREATININE 0.61 10/25/2022 0250   CALCIUM 8.6 (L) 10/25/2022 0250   PROT 5.7 (L) 10/25/2022 0250   ALBUMIN 2.3 (L) 10/25/2022 0250   AST 25 10/25/2022 0250   ALT 48 (H) 10/25/2022 0250   ALKPHOS 122 10/25/2022 0250   BILITOT 0.9 10/25/2022 0250   GFRNONAA >60 10/25/2022 0250   GFRAA >60 11/28/2015 1227   Lipase     Component Value Date/Time   LIPASE 24  10/21/2022 0420       Studies/Results: No results found.  Anti-infectives: Anti-infectives (From admission, onward)    Start     Dose/Rate Route Frequency Ordered Stop   10/25/22 1400  metroNIDAZOLE (FLAGYL) tablet 500 mg        500 mg Oral Every 8 hours 10/25/22 1004 10/28/22 1359   10/25/22 1100  ciprofloxacin (CIPRO) tablet 500 mg        500 mg Oral 2 times daily 10/25/22 1004 10/28/22 0759   10/18/22 1100  metroNIDAZOLE (FLAGYL) IVPB 500 mg  Status:  Discontinued        500 mg 100 mL/hr over 60 Minutes Intravenous Every 12 hours 10/18/22 1010 10/25/22 1004   10/18/22 1100  ciprofloxacin (CIPRO) IVPB 400 mg  Status:  Discontinued        400 mg 200 mL/hr over 60 Minutes Intravenous Every 12 hours 10/18/22 1010 10/25/22 1004        Assessment/Plan Gallstone pancreatitis Chronic cholecystitis POD#4 s/p laparoscopic cholecystectomy, drain placement 2/21 Dr. Kae Heller - AFVSS, WBC is hovering around 20 but pt's symptoms are improving, Cont abx for 3 more days and then stop - Cont soft diet  - d/c JP drain  - From post surgical standpoint, I believe the pt  is ok to d/c.  Will defer to medicine re: her infectious standpoint    LOS: 8 days   I reviewed nursing notes, hospitalist notes, last 24 h vitals and pain scores, last 48 h intake and output, last 24 h labs and trends, and last 24 h imaging results.   Rosario Adie, MD  [facility]

## 2022-10-30 DIAGNOSIS — K811 Chronic cholecystitis: Secondary | ICD-10-CM | POA: Diagnosis not present

## 2022-10-30 DIAGNOSIS — E878 Other disorders of electrolyte and fluid balance, not elsewhere classified: Secondary | ICD-10-CM | POA: Diagnosis not present

## 2022-10-30 DIAGNOSIS — K851 Biliary acute pancreatitis without necrosis or infection: Secondary | ICD-10-CM | POA: Diagnosis not present

## 2022-11-06 LAB — GLUCOSE, CAPILLARY
Glucose-Capillary: 85 mg/dL (ref 70–99)
Glucose-Capillary: 85 mg/dL (ref 70–99)
Glucose-Capillary: 95 mg/dL (ref 70–99)

## 2022-11-26 DIAGNOSIS — D72829 Elevated white blood cell count, unspecified: Secondary | ICD-10-CM | POA: Diagnosis not present

## 2022-12-29 DIAGNOSIS — M545 Low back pain, unspecified: Secondary | ICD-10-CM | POA: Diagnosis not present

## 2022-12-29 DIAGNOSIS — M479 Spondylosis, unspecified: Secondary | ICD-10-CM | POA: Diagnosis not present

## 2023-04-06 DIAGNOSIS — M479 Spondylosis, unspecified: Secondary | ICD-10-CM | POA: Diagnosis not present

## 2023-04-06 DIAGNOSIS — M5106 Intervertebral disc disorders with myelopathy, lumbar region: Secondary | ICD-10-CM | POA: Diagnosis not present

## 2023-04-06 DIAGNOSIS — Z6837 Body mass index (BMI) 37.0-37.9, adult: Secondary | ICD-10-CM | POA: Diagnosis not present

## 2023-04-07 ENCOUNTER — Other Ambulatory Visit: Payer: Self-pay | Admitting: Orthopedic Surgery

## 2023-04-07 DIAGNOSIS — M479 Spondylosis, unspecified: Secondary | ICD-10-CM

## 2023-04-07 DIAGNOSIS — Z6837 Body mass index (BMI) 37.0-37.9, adult: Secondary | ICD-10-CM

## 2023-04-09 DIAGNOSIS — F419 Anxiety disorder, unspecified: Secondary | ICD-10-CM | POA: Diagnosis not present

## 2023-04-23 ENCOUNTER — Ambulatory Visit
Admission: RE | Admit: 2023-04-23 | Discharge: 2023-04-23 | Disposition: A | Discharge status: Home / Self Care | Payer: BC Managed Care – PPO | Source: Ambulatory Visit | Attending: Orthopedic Surgery | Admitting: Orthopedic Surgery

## 2023-04-23 DIAGNOSIS — M479 Spondylosis, unspecified: Secondary | ICD-10-CM

## 2023-04-23 DIAGNOSIS — Z6837 Body mass index (BMI) 37.0-37.9, adult: Secondary | ICD-10-CM

## 2023-04-23 DIAGNOSIS — M5126 Other intervertebral disc displacement, lumbar region: Secondary | ICD-10-CM | POA: Diagnosis not present

## 2023-04-23 DIAGNOSIS — M47816 Spondylosis without myelopathy or radiculopathy, lumbar region: Secondary | ICD-10-CM | POA: Diagnosis not present

## 2023-04-27 DIAGNOSIS — M47896 Other spondylosis, lumbar region: Secondary | ICD-10-CM | POA: Diagnosis not present

## 2023-04-27 DIAGNOSIS — M545 Low back pain, unspecified: Secondary | ICD-10-CM | POA: Diagnosis not present

## 2023-04-29 DIAGNOSIS — M47816 Spondylosis without myelopathy or radiculopathy, lumbar region: Secondary | ICD-10-CM | POA: Diagnosis not present

## 2023-05-18 DIAGNOSIS — M47816 Spondylosis without myelopathy or radiculopathy, lumbar region: Secondary | ICD-10-CM | POA: Diagnosis not present

## 2023-05-19 DIAGNOSIS — M25652 Stiffness of left hip, not elsewhere classified: Secondary | ICD-10-CM | POA: Diagnosis not present

## 2023-05-19 DIAGNOSIS — M5117 Intervertebral disc disorders with radiculopathy, lumbosacral region: Secondary | ICD-10-CM | POA: Diagnosis not present

## 2023-05-19 DIAGNOSIS — M25551 Pain in right hip: Secondary | ICD-10-CM | POA: Diagnosis not present

## 2023-05-19 DIAGNOSIS — M25651 Stiffness of right hip, not elsewhere classified: Secondary | ICD-10-CM | POA: Diagnosis not present

## 2023-06-08 DIAGNOSIS — M47816 Spondylosis without myelopathy or radiculopathy, lumbar region: Secondary | ICD-10-CM | POA: Diagnosis not present

## 2023-06-14 DIAGNOSIS — M47816 Spondylosis without myelopathy or radiculopathy, lumbar region: Secondary | ICD-10-CM | POA: Diagnosis not present

## 2023-07-06 DIAGNOSIS — M47816 Spondylosis without myelopathy or radiculopathy, lumbar region: Secondary | ICD-10-CM | POA: Diagnosis not present

## 2023-07-06 DIAGNOSIS — Z6837 Body mass index (BMI) 37.0-37.9, adult: Secondary | ICD-10-CM | POA: Diagnosis not present

## 2023-07-06 DIAGNOSIS — M479 Spondylosis, unspecified: Secondary | ICD-10-CM | POA: Diagnosis not present

## 2023-07-30 DIAGNOSIS — K047 Periapical abscess without sinus: Secondary | ICD-10-CM | POA: Diagnosis not present

## 2023-10-06 DIAGNOSIS — R051 Acute cough: Secondary | ICD-10-CM | POA: Diagnosis not present

## 2023-10-06 DIAGNOSIS — B349 Viral infection, unspecified: Secondary | ICD-10-CM | POA: Diagnosis not present

## 2023-10-29 DIAGNOSIS — R0981 Nasal congestion: Secondary | ICD-10-CM | POA: Diagnosis not present

## 2023-10-29 DIAGNOSIS — H60311 Diffuse otitis externa, right ear: Secondary | ICD-10-CM | POA: Diagnosis not present

## 2023-10-29 DIAGNOSIS — I10 Essential (primary) hypertension: Secondary | ICD-10-CM | POA: Diagnosis not present

## 2023-12-31 DIAGNOSIS — F419 Anxiety disorder, unspecified: Secondary | ICD-10-CM | POA: Diagnosis not present

## 2023-12-31 DIAGNOSIS — I10 Essential (primary) hypertension: Secondary | ICD-10-CM | POA: Diagnosis not present

## 2024-01-28 DIAGNOSIS — G894 Chronic pain syndrome: Secondary | ICD-10-CM | POA: Diagnosis not present

## 2024-01-28 DIAGNOSIS — M791 Myalgia, unspecified site: Secondary | ICD-10-CM | POA: Diagnosis not present

## 2024-01-28 DIAGNOSIS — M47816 Spondylosis without myelopathy or radiculopathy, lumbar region: Secondary | ICD-10-CM | POA: Diagnosis not present

## 2024-01-28 DIAGNOSIS — M545 Low back pain, unspecified: Secondary | ICD-10-CM | POA: Diagnosis not present

## 2024-01-28 DIAGNOSIS — Z1331 Encounter for screening for depression: Secondary | ICD-10-CM | POA: Diagnosis not present

## 2024-03-23 DIAGNOSIS — M5116 Intervertebral disc disorders with radiculopathy, lumbar region: Secondary | ICD-10-CM | POA: Diagnosis not present

## 2024-03-23 DIAGNOSIS — M47816 Spondylosis without myelopathy or radiculopathy, lumbar region: Secondary | ICD-10-CM | POA: Diagnosis not present

## 2024-04-20 DIAGNOSIS — M791 Myalgia, unspecified site: Secondary | ICD-10-CM | POA: Diagnosis not present

## 2024-04-20 DIAGNOSIS — M47816 Spondylosis without myelopathy or radiculopathy, lumbar region: Secondary | ICD-10-CM | POA: Diagnosis not present

## 2024-05-09 DIAGNOSIS — M47816 Spondylosis without myelopathy or radiculopathy, lumbar region: Secondary | ICD-10-CM | POA: Diagnosis not present

## 2024-06-01 DIAGNOSIS — B372 Candidiasis of skin and nail: Secondary | ICD-10-CM | POA: Diagnosis not present

## 2024-06-01 DIAGNOSIS — J019 Acute sinusitis, unspecified: Secondary | ICD-10-CM | POA: Diagnosis not present

## 2024-06-06 DIAGNOSIS — M791 Myalgia, unspecified site: Secondary | ICD-10-CM | POA: Diagnosis not present

## 2024-06-06 DIAGNOSIS — M47816 Spondylosis without myelopathy or radiculopathy, lumbar region: Secondary | ICD-10-CM | POA: Diagnosis not present

## 2024-07-11 DIAGNOSIS — M791 Myalgia, unspecified site: Secondary | ICD-10-CM | POA: Diagnosis not present

## 2024-07-11 DIAGNOSIS — M479 Spondylosis, unspecified: Secondary | ICD-10-CM | POA: Diagnosis not present

## 2024-07-21 DIAGNOSIS — R209 Unspecified disturbances of skin sensation: Secondary | ICD-10-CM | POA: Diagnosis not present

## 2024-07-21 DIAGNOSIS — I10 Essential (primary) hypertension: Secondary | ICD-10-CM | POA: Diagnosis not present

## 2024-07-21 DIAGNOSIS — Z6841 Body Mass Index (BMI) 40.0 and over, adult: Secondary | ICD-10-CM | POA: Diagnosis not present

## 2024-07-21 DIAGNOSIS — M2550 Pain in unspecified joint: Secondary | ICD-10-CM | POA: Diagnosis not present

## 2024-07-21 DIAGNOSIS — R251 Tremor, unspecified: Secondary | ICD-10-CM | POA: Diagnosis not present

## 2024-07-21 DIAGNOSIS — Z0000 Encounter for general adult medical examination without abnormal findings: Secondary | ICD-10-CM | POA: Diagnosis not present

## 2024-08-01 DIAGNOSIS — M47812 Spondylosis without myelopathy or radiculopathy, cervical region: Secondary | ICD-10-CM | POA: Diagnosis not present

## 2024-08-01 DIAGNOSIS — M47816 Spondylosis without myelopathy or radiculopathy, lumbar region: Secondary | ICD-10-CM | POA: Diagnosis not present

## 2024-08-01 DIAGNOSIS — G894 Chronic pain syndrome: Secondary | ICD-10-CM | POA: Diagnosis not present

## 2024-08-01 DIAGNOSIS — M542 Cervicalgia: Secondary | ICD-10-CM | POA: Diagnosis not present

## 2024-09-21 NOTE — Progress Notes (Unsigned)
 "  Office Visit Note  Patient: Catherine [name]             Date of Birth: Jun 21, 1971           MRN: [id number]             PCP: Catherine Charleston, MD Referring: Catherine Charleston, MD Visit Date: 10/03/2024 Occupation: Data Unavailable  Subjective:  +RF  History of Present Illness: Catherine [name] is a 54 y.o. female who presents today for a new patient consultation.       Activities of Daily Living:  Patient reports morning stiffness for *** {minute/hour:19697}.   Patient {ACTIONS;DENIES/REPORTS:21021675::Denies} nocturnal pain.  Difficulty dressing/grooming: {ACTIONS;DENIES/REPORTS:21021675::Denies} Difficulty climbing stairs: {ACTIONS;DENIES/REPORTS:21021675::Denies} Difficulty getting out of chair: {ACTIONS;DENIES/REPORTS:21021675::Denies} Difficulty using hands for taps, buttons, cutlery, and/or writing: {ACTIONS;DENIES/REPORTS:21021675::Denies}  No Rheumatology ROS completed.   PMFS History:  Patient Active Problem List   Diagnosis Date Noted   Acute gallstone pancreatitis 10/17/2022   Cholelithiases 10/17/2022   Abnormal LFTs 10/17/2022   Hypokalemia 10/17/2022   Hyponatremia 10/17/2022   Acute biliary pancreatitis 10/17/2022   Essential hypertension 11/29/2015   Headache disorder 07/04/2014   Left arm swelling 03/01/2012   Intractable migraine without aura 09/11/2008   Anxiety state 09/14/2007   Alcohol  abuse 09/14/2007   TOBACCO USE 09/14/2007   Bipolar 1 disorder (HCC) 09/14/2007   INTERSTITIAL CYSTITIS 09/14/2007   ROSACEA 09/14/2007    Past Medical History:  Diagnosis Date   Anxiety    Bipolar 1 disorder (HCC)    Cystitis, interstitial    Depression    Hypertension    Migraine     Family History  Problem Relation Age of Onset   Sudden death Mother        suicide   Hypothyroidism Sister    Past Surgical History:  Procedure Laterality Date   ABDOMINAL HYSTERECTOMY     CHOLECYSTECTOMY N/A 10/21/2022   Procedure: LAPAROSCOPIC  CHOLECYSTECTOMY;  Surgeon: Signe Mitzie LABOR, MD;  Location: [facility] OR;  Service: General;  Laterality: N/A;   CYSTOSCOPY     ERCP N/A 10/18/2022   Procedure: ENDOSCOPIC RETROGRADE CHOLANGIOPANCREATOGRAPHY (ERCP);  Surgeon: Rosalie Kitchens, MD;  Location: [facility] ENDOSCOPY;  Service: Gastroenterology;  Laterality: N/A;   REMOVAL OF STONES  10/18/2022   Procedure: REMOVAL OF STONES;  Surgeon: Rosalie Kitchens, MD;  Location: [facility] ENDOSCOPY;  Service: Gastroenterology;;   ANNETT  10/18/2022   Procedure: ANNETT;  Surgeon: Rosalie Kitchens, MD;  Location: [facility] ENDOSCOPY;  Service: Gastroenterology;;   TOTAL ABDOMINAL HYSTERECTOMY W/ BILATERAL SALPINGOOPHORECTOMY     Social History[1] Social History   Social History Narrative   Working - Works at [employer]   Two kids  - do not live with her. 21 and 24        Immunization History  Administered Date(s) Administered   Influenza,inj,Quad PF,6+ Mos 09/16/2018   PFIZER(Purple Top)SARS-COV-2 Vaccination 11/17/2019, 12/12/2019, 06/15/2020     Objective: Vital Signs: There were no vitals taken for this visit.   Physical Exam Vitals and nursing note reviewed.  Constitutional:      Appearance: She is well-developed.  HENT:     Head: Normocephalic and atraumatic.  Eyes:     Conjunctiva/sclera: Conjunctivae normal.  Cardiovascular:     Rate and Rhythm: Normal rate and regular rhythm.     Heart sounds: Normal heart sounds.  Pulmonary:     Effort: Pulmonary effort is normal.     Breath sounds: Normal breath sounds.  Abdominal:  General: Bowel sounds are normal.     Palpations: Abdomen is soft.  Musculoskeletal:     Cervical back: Normal range of motion.  Lymphadenopathy:     Cervical: No cervical adenopathy.  Skin:    General: Skin is warm and dry.     Capillary Refill: Capillary refill takes less than 2 seconds.  Neurological:     Mental Status: She is alert and oriented to person, place, and time.  Psychiatric:         Behavior: Behavior normal.      Musculoskeletal Exam: ***  CDAI Exam: CDAI Score: -- Patient Global: --; Provider Global: -- Swollen: --; Tender: -- Joint Exam 10/03/2024   No joint exam has been documented for this visit   There is currently no information documented on the homunculus. Go to the Rheumatology activity and complete the homunculus joint exam.  Investigation: No additional findings.  Imaging: No results found.  Recent Labs: Lab Results  Component Value Date   WBC 20.3 (H) 10/25/2022   HGB 11.4 (L) 10/25/2022   PLT 355 10/25/2022   NA 139 10/25/2022   K 4.0 10/25/2022   CL 105 10/25/2022   CO2 24 10/25/2022   GLUCOSE 101 (H) 10/25/2022   BUN 5 (L) 10/25/2022   CREATININE 0.61 10/25/2022   BILITOT 0.9 10/25/2022   ALKPHOS 122 10/25/2022   AST 25 10/25/2022   ALT 48 (H) 10/25/2022   PROT 5.7 (L) 10/25/2022   ALBUMIN 2.3 (L) 10/25/2022   CALCIUM 8.6 (L) 10/25/2022   GFRAA >60 11/28/2015    Speciality Comments: No specialty comments available.  Procedures:  No procedures performed Allergies: Augmentin [amoxicillin-pot clavulanate], Penicillins, Depakote [divalproex sodium], and Latex   Assessment / Plan:     Visit Diagnoses: Rheumatoid factor positive - 07/21/24: ESR 31, ANA negative, vitamin B12 290, Mg 1.7, CRP 3, RF 14.4  Spondylosis without myelopathy or radiculopathy, cervical region  Spondylosis without myelopathy or radiculopathy, lumbar region  Polyarthralgia  Tremor  Essential hypertension  History of IBS  Intractable migraine without aura and with status migrainosus  Calculus of gallbladder and bile duct with chronic cholecystitis with obstruction  Bipolar 1 disorder (HCC)  Anxiety state  Alcohol  abuse  Abnormal LFTs  IC (interstitial cystitis)  Rosacea  Orders: No orders of the defined types were placed in this encounter.  No orders of the defined types were placed in this encounter.   Face-to-face time spent  with patient was *** minutes. Greater than 50% of time was spent in counseling and coordination of care.  Follow-Up Instructions: No follow-ups on file.   Waddell CHRISTELLA Craze, PA-C  Note - This record has been created using Dragon software.  Chart creation errors have been sought, but may not always  have been located. Such creation errors do not reflect on  the standard of medical care.     [1]  Social History Tobacco Use   Smoking status: Some Days    Types: E-cigarettes   Smokeless tobacco: Never  Vaping Use   Vaping status: Some Days  Substance Use Topics   Alcohol  use: Yes    Comment: social   Drug use: No   "

## 2024-10-03 ENCOUNTER — Ambulatory Visit: Admitting: Physician Assistant

## 2024-10-03 DIAGNOSIS — N3010 Interstitial cystitis (chronic) without hematuria: Secondary | ICD-10-CM

## 2024-10-03 DIAGNOSIS — F411 Generalized anxiety disorder: Secondary | ICD-10-CM

## 2024-10-03 DIAGNOSIS — R7689 Other specified abnormal immunological findings in serum: Secondary | ICD-10-CM

## 2024-10-03 DIAGNOSIS — I10 Essential (primary) hypertension: Secondary | ICD-10-CM

## 2024-10-03 DIAGNOSIS — F319 Bipolar disorder, unspecified: Secondary | ICD-10-CM

## 2024-10-03 DIAGNOSIS — R7989 Other specified abnormal findings of blood chemistry: Secondary | ICD-10-CM

## 2024-10-03 DIAGNOSIS — L719 Rosacea, unspecified: Secondary | ICD-10-CM

## 2024-10-03 DIAGNOSIS — R251 Tremor, unspecified: Secondary | ICD-10-CM

## 2024-10-03 DIAGNOSIS — M47816 Spondylosis without myelopathy or radiculopathy, lumbar region: Secondary | ICD-10-CM

## 2024-10-03 DIAGNOSIS — M47812 Spondylosis without myelopathy or radiculopathy, cervical region: Secondary | ICD-10-CM

## 2024-10-03 DIAGNOSIS — F1010 Alcohol abuse, uncomplicated: Secondary | ICD-10-CM

## 2024-10-03 DIAGNOSIS — K8065 Calculus of gallbladder and bile duct with chronic cholecystitis with obstruction: Secondary | ICD-10-CM

## 2024-10-03 DIAGNOSIS — Z8719 Personal history of other diseases of the digestive system: Secondary | ICD-10-CM

## 2024-10-03 DIAGNOSIS — M2550 Pain in unspecified joint: Secondary | ICD-10-CM

## 2024-10-03 DIAGNOSIS — G43011 Migraine without aura, intractable, with status migrainosus: Secondary | ICD-10-CM

## 2024-10-27 ENCOUNTER — Ambulatory Visit: Admitting: Neurology

## 2024-11-29 ENCOUNTER — Ambulatory Visit: Admitting: Physician Assistant
